# Patient Record
Sex: Female | Born: 1950 | Hispanic: No | Marital: Married | Smoking: Never smoker
Health system: Southern US, Community
[De-identification: ages and names within clinical notes are randomized; demographics above are authoritative.]

## PROBLEM LIST (undated history)

## (undated) DIAGNOSIS — I10 Essential (primary) hypertension: Secondary | ICD-10-CM

## (undated) DIAGNOSIS — R7303 Prediabetes: Secondary | ICD-10-CM

## (undated) DIAGNOSIS — E785 Hyperlipidemia, unspecified: Secondary | ICD-10-CM

## (undated) DIAGNOSIS — D649 Anemia, unspecified: Secondary | ICD-10-CM

## (undated) DIAGNOSIS — T7840XA Allergy, unspecified, initial encounter: Secondary | ICD-10-CM

## (undated) DIAGNOSIS — R310 Gross hematuria: Secondary | ICD-10-CM

## (undated) DIAGNOSIS — H269 Unspecified cataract: Secondary | ICD-10-CM

## (undated) DIAGNOSIS — M1990 Unspecified osteoarthritis, unspecified site: Secondary | ICD-10-CM

## (undated) DIAGNOSIS — N951 Menopausal and female climacteric states: Secondary | ICD-10-CM

## (undated) HISTORY — DX: Unspecified cataract: H26.9

## (undated) HISTORY — DX: Hyperlipidemia, unspecified: E78.5

## (undated) HISTORY — DX: Allergy, unspecified, initial encounter: T78.40XA

## (undated) HISTORY — DX: Unspecified osteoarthritis, unspecified site: M19.90

## (undated) HISTORY — DX: Menopausal and female climacteric states: N95.1

## (undated) HISTORY — DX: Essential (primary) hypertension: I10

## (undated) HISTORY — DX: Anemia, unspecified: D64.9

## (undated) HISTORY — PX: DILATION AND CURETTAGE OF UTERUS: SHX78

## (undated) HISTORY — PX: OTHER SURGICAL HISTORY: SHX169

## (undated) HISTORY — PX: COLONOSCOPY: SHX174

---

## 1999-12-28 ENCOUNTER — Other Ambulatory Visit: Admission: RE | Admit: 1999-12-28 | Discharge: 1999-12-28 | Payer: Self-pay | Admitting: Obstetrics and Gynecology

## 2000-01-18 ENCOUNTER — Ambulatory Visit (HOSPITAL_COMMUNITY): Admission: RE | Admit: 2000-01-18 | Discharge: 2000-01-18 | Payer: Self-pay | Admitting: Obstetrics and Gynecology

## 2000-02-12 ENCOUNTER — Encounter: Admission: RE | Admit: 2000-02-12 | Discharge: 2000-02-12 | Payer: Self-pay | Admitting: Obstetrics and Gynecology

## 2000-02-12 ENCOUNTER — Encounter: Payer: Self-pay | Admitting: Obstetrics and Gynecology

## 2001-09-28 ENCOUNTER — Encounter: Admission: RE | Admit: 2001-09-28 | Discharge: 2001-09-28 | Payer: Self-pay | Admitting: Family Medicine

## 2001-09-28 ENCOUNTER — Encounter: Payer: Self-pay | Admitting: Family Medicine

## 2002-10-01 ENCOUNTER — Encounter: Payer: Self-pay | Admitting: Internal Medicine

## 2002-10-01 ENCOUNTER — Encounter: Admission: RE | Admit: 2002-10-01 | Discharge: 2002-10-01 | Payer: Self-pay | Admitting: Internal Medicine

## 2003-11-02 ENCOUNTER — Encounter: Admission: RE | Admit: 2003-11-02 | Discharge: 2003-11-02 | Payer: Self-pay | Admitting: Family Medicine

## 2003-12-01 ENCOUNTER — Ambulatory Visit (HOSPITAL_COMMUNITY): Admission: RE | Admit: 2003-12-01 | Discharge: 2003-12-01 | Payer: Self-pay | Admitting: Cardiology

## 2013-02-15 ENCOUNTER — Ambulatory Visit: Payer: Self-pay | Admitting: Sports Medicine

## 2013-02-18 ENCOUNTER — Encounter: Payer: Self-pay | Admitting: Sports Medicine

## 2013-02-18 ENCOUNTER — Ambulatory Visit (INDEPENDENT_AMBULATORY_CARE_PROVIDER_SITE_OTHER): Payer: BC Managed Care – PPO | Admitting: Sports Medicine

## 2013-02-18 VITALS — BP 119/73 | HR 87 | Wt 165.0 lb

## 2013-02-18 DIAGNOSIS — Z299 Encounter for prophylactic measures, unspecified: Secondary | ICD-10-CM

## 2013-02-18 DIAGNOSIS — Z9109 Other allergy status, other than to drugs and biological substances: Secondary | ICD-10-CM | POA: Insufficient documentation

## 2013-02-18 DIAGNOSIS — D649 Anemia, unspecified: Secondary | ICD-10-CM

## 2013-02-18 DIAGNOSIS — Z0000 Encounter for general adult medical examination without abnormal findings: Secondary | ICD-10-CM | POA: Insufficient documentation

## 2013-02-18 DIAGNOSIS — K5900 Constipation, unspecified: Secondary | ICD-10-CM

## 2013-02-18 DIAGNOSIS — E669 Obesity, unspecified: Secondary | ICD-10-CM

## 2013-02-18 DIAGNOSIS — K5909 Other constipation: Secondary | ICD-10-CM | POA: Insufficient documentation

## 2013-02-18 DIAGNOSIS — I10 Essential (primary) hypertension: Secondary | ICD-10-CM

## 2013-02-18 DIAGNOSIS — E785 Hyperlipidemia, unspecified: Secondary | ICD-10-CM

## 2013-02-18 MED ORDER — LORATADINE 10 MG PO TABS: 10.0000 mg | ORAL_TABLET | Freq: Every day | ORAL | Status: DC

## 2013-02-18 MED ORDER — PHENTERMINE HCL 37.5 MG PO CAPS: 37.5000 mg | ORAL_CAPSULE | ORAL | Status: DC

## 2013-02-18 MED ORDER — SENNOSIDES-DOCUSATE SODIUM 8.6-50 MG PO TABS: 2.0000 | ORAL_TABLET | Freq: Two times a day (BID) | ORAL | Status: DC

## 2013-02-18 MED ORDER — OLOPATADINE HCL 0.1 % OP SOLN: 1.0000 [drp] | Freq: Two times a day (BID) | OPHTHALMIC | Status: DC

## 2013-02-18 NOTE — Assessment & Plan Note (Signed)
Loratadine daily, Patanol eyedrops.

## 2013-02-18 NOTE — Assessment & Plan Note (Signed)
Double fluid intake. Senokot-S 2 tabs twice a day until stooling normally then one tab daily. Can certainly consider Amitiza or other medications if above is ineffective.

## 2013-02-18 NOTE — Progress Notes (Signed)
  Subjective:    CC: Establish care.   HPI:  Allergies: Daily conjunctival irritation and watering eyes, worse during this time of year. Also has a dry, scratchy throat. She does note that this is worse in the mornings, she often gets a dry cough. Her previous doctor switched her from an ACE inhibitor to an ARB, and this seemed to help. She has not yet been treated for acid reflux.  Hypertension: Well controlled on current medication.  Chronic constipation: Admits that she doesn't drink much water, and has not been eating much fiber. Is wondering if there is a medicine that can help her stool more regularly.  Obesity: Exercises every day, tries to eat well but is unable to lose weight. Wondering if there is any other options. She is very motivated.  Preventative measures: Would like a referral to OB/GYN for cervical cancer screening, needs screening colonoscopy, mammogram, bone density test.  Past medical history, Surgical history, Family history not pertinant except as noted below, Social history, Allergies, and medications have been entered into the medical record, reviewed, and no changes needed.   Review of Systems: No headache, visual changes, nausea, vomiting, diarrhea, constipation, dizziness, abdominal pain, skin rash, fevers, chills, night sweats, swollen lymph nodes, weight loss, chest pain, body aches, joint swelling, muscle aches, shortness of breath, mood changes, visual or auditory hallucinations.  Objective:    General: Well Developed, well nourished, and in no acute distress.  Neuro: Alert and oriented x3, extra-ocular muscles intact, sensation grossly intact.  HEENT: Normocephalic, atraumatic, pupils equal round reactive to light, neck supple, no masses, no lymphadenopathy, thyroid nonpalpable. Eyes are watery, nasopharynx unremarkable, oropharynx unremarkable, external ear canals unremarkable. Skin: Warm and dry, no rashes noted.  Cardiac: Regular rate and rhythm, no  murmurs rubs or gallops.  Respiratory: Clear to auscultation bilaterally. Not using accessory muscles, speaking in full sentences.  Abdominal: Soft, nontender, nondistended, positive bowel sounds, no masses, no organomegaly.  Musculoskeletal: Shoulder, elbow, wrist, hip, knee, ankle stable, and with full range of motion. Impression and Recommendations:    The patient was counselled, risk factors were discussed, anticipatory guidance given.

## 2013-02-18 NOTE — Assessment & Plan Note (Signed)
Nutritionist visit. Phentermine. Return monthly for weight checks Continue exercise prescription.

## 2013-02-18 NOTE — Assessment & Plan Note (Signed)
Samples given for Benicar HCT. This is well controlled.

## 2013-02-18 NOTE — Assessment & Plan Note (Signed)
Checking routine blood work. Mammogram. She desires referral to OB/GYN for cervical cancer screening. Bone density test. GI referral for screening colonoscopy. Will discuss vaccinations at the next visit.

## 2013-02-23 ENCOUNTER — Ambulatory Visit (INDEPENDENT_AMBULATORY_CARE_PROVIDER_SITE_OTHER): Payer: BC Managed Care – PPO

## 2013-02-23 ENCOUNTER — Encounter: Payer: Self-pay | Admitting: Family Medicine

## 2013-02-23 ENCOUNTER — Ambulatory Visit (INDEPENDENT_AMBULATORY_CARE_PROVIDER_SITE_OTHER): Payer: BC Managed Care – PPO | Admitting: Family Medicine

## 2013-02-23 ENCOUNTER — Other Ambulatory Visit: Payer: Self-pay | Admitting: Sports Medicine

## 2013-02-23 VITALS — BP 107/70 | HR 67 | Temp 98.5°F | Resp 16 | Ht 60.0 in | Wt 163.0 lb

## 2013-02-23 DIAGNOSIS — Z01419 Encounter for gynecological examination (general) (routine) without abnormal findings: Secondary | ICD-10-CM

## 2013-02-23 DIAGNOSIS — Z13820 Encounter for screening for osteoporosis: Secondary | ICD-10-CM

## 2013-02-23 DIAGNOSIS — Z124 Encounter for screening for malignant neoplasm of cervix: Secondary | ICD-10-CM

## 2013-02-23 DIAGNOSIS — Z1231 Encounter for screening mammogram for malignant neoplasm of breast: Secondary | ICD-10-CM

## 2013-02-23 DIAGNOSIS — Z780 Asymptomatic menopausal state: Secondary | ICD-10-CM

## 2013-02-23 DIAGNOSIS — Z1151 Encounter for screening for human papillomavirus (HPV): Secondary | ICD-10-CM

## 2013-02-23 LAB — COMPREHENSIVE METABOLIC PANEL
ALT: 15 U/L (ref 0–35)
AST: 21 U/L (ref 0–37)
Alkaline Phosphatase: 78 U/L (ref 39–117)
BUN: 21 mg/dL (ref 6–23)
Chloride: 105 mEq/L (ref 96–112)
Creat: 0.93 mg/dL (ref 0.50–1.10)
Total Bilirubin: 0.7 mg/dL (ref 0.3–1.2)

## 2013-02-23 LAB — CBC
HCT: 34.4 % — ABNORMAL LOW (ref 36.0–46.0)
Hemoglobin: 11.4 g/dL — ABNORMAL LOW (ref 12.0–15.0)
MCH: 29 pg (ref 26.0–34.0)
MCHC: 33.1 g/dL (ref 30.0–36.0)
MCV: 87.5 fL (ref 78.0–100.0)
Platelets: 234 10*3/uL (ref 150–400)
RBC: 3.93 MIL/uL (ref 3.87–5.11)
RDW: 14.9 % (ref 11.5–15.5)
WBC: 5.4 10*3/uL (ref 4.0–10.5)

## 2013-02-23 LAB — COMPREHENSIVE METABOLIC PANEL WITH GFR
Albumin: 4 g/dL (ref 3.5–5.2)
CO2: 27 meq/L (ref 19–32)
Calcium: 9.2 mg/dL (ref 8.4–10.5)
Glucose, Bld: 88 mg/dL (ref 70–99)
Potassium: 4.2 meq/L (ref 3.5–5.3)
Sodium: 140 meq/L (ref 135–145)
Total Protein: 6.8 g/dL (ref 6.0–8.3)

## 2013-02-23 LAB — LIPID PANEL
Cholesterol: 210 mg/dL — ABNORMAL HIGH (ref 0–200)
HDL: 53 mg/dL (ref >39)
LDL Cholesterol: 139 mg/dL — ABNORMAL HIGH (ref 0–99)
Total CHOL/HDL Ratio: 4 Ratio
Triglycerides: 90 mg/dL (ref <150)
VLDL: 18 mg/dL (ref 0–40)

## 2013-02-23 LAB — TSH: TSH: 1.058 u[IU]/mL (ref 0.350–4.500)

## 2013-02-23 LAB — HEMOGLOBIN A1C
Hgb A1c MFr Bld: 5.9 % — ABNORMAL HIGH (ref <5.7)
Mean Plasma Glucose: 123 mg/dL — ABNORMAL HIGH (ref <117)

## 2013-02-23 NOTE — Progress Notes (Signed)
  Subjective:     Glenda Burke is a 62 y.o. female and is here for a comprehensive physical exam. The patient reports no problems.  History   Social History  . Marital Status: Married    Spouse Name: N/A    Number of Children: N/A  . Years of Education: N/A   Occupational History  . retired    Social History Main Topics  . Smoking status: Never Smoker   . Smokeless tobacco: Never Used  . Alcohol Use: No  . Drug Use: No  . Sexually Active: Yes -- Female partner(s)   Other Topics Concern  . Not on file   Social History Narrative  . No narrative on file   Health Maintenance  Topic Date Due  . Pap Smear  04/22/1970  . Tetanus/tdap  04/23/1971  . Mammogram  04/22/2002  . Colonoscopy  04/22/2002  . Zostavax  04/22/2012  . Influenza Vaccine  06/14/2013    The following portions of the patient's history were reviewed and updated as appropriate: allergies, current medications, past family history, past medical history, past social history, past surgical history and problem list.  Review of Systems A comprehensive review of systems was negative.   Objective:    BP 107/70  Pulse 67  Temp(Src) 98.5 F (36.9 C) (Oral)  Resp 16  Ht 5' (1.524 m)  Wt 163 lb (73.936 kg)  BMI 31.83 kg/m2 General appearance: alert, cooperative and appears stated age Head: Normocephalic, without obvious abnormality, atraumatic Neck: no adenopathy, supple, symmetrical, trachea midline and thyroid not enlarged, symmetric, no tenderness/mass/nodules Lungs: clear to auscultation bilaterally Breasts: normal appearance, no masses or tenderness Heart: regular rate and rhythm, S1, S2 normal, no murmur, click, rub or gallop Abdomen: soft, non-tender; bowel sounds normal; no masses,  no organomegaly Pelvic: cervix normal in appearance, external genitalia normal, no adnexal masses or tenderness, no cervical motion tenderness, uterus normal size, shape, and consistency and vagina atrophic Extremities:  extremities normal, atraumatic, no cyanosis or edema Pulses: 2+ and symmetric Skin: Skin color, texture, turgor normal. No rashes or lesions Lymph nodes: Cervical, supraclavicular, and axillary nodes normal. Neurologic: Grossly normal    Assessment:    Healthy female exam. Menopausal.       Plan:  Pap with HPV testing today.  If normal--next one in 5 years. Mammogram today, un-resulted. Blood work through PCP also un-resulted, but drawn this am.   See After Visit Summary for Counseling Recommendations

## 2013-02-23 NOTE — Patient Instructions (Addendum)
Preventive Care for Adults, Female A healthy lifestyle and preventive care can promote health and wellness. Preventive health guidelines for women include the following key practices.  A routine yearly physical is a good way to check with your caregiver about your health and preventive screening. It is a chance to share any concerns and updates on your health, and to receive a thorough exam.  Visit your dentist for a routine exam and preventive care every 6 months. Brush your teeth twice a day and floss once a day. Good oral hygiene prevents tooth decay and gum disease.  The frequency of eye exams is based on your age, health, family medical history, use of contact lenses, and other factors. Follow your caregiver's recommendations for frequency of eye exams.  Eat a healthy diet. Foods like vegetables, fruits, whole grains, low-fat dairy products, and lean protein foods contain the nutrients you need without too many calories. Decrease your intake of foods high in solid fats, added sugars, and salt. Eat the right amount of calories for you.Get information about a proper diet from your caregiver, if necessary.  Regular physical exercise is one of the most important things you can do for your health. Most adults should get at least 150 minutes of moderate-intensity exercise (any activity that increases your heart rate and causes you to sweat) each week. In addition, most adults need muscle-strengthening exercises on 2 or more days a week.  Maintain a healthy weight. The body mass index (BMI) is a screening tool to identify possible weight problems. It provides an estimate of body fat based on height and weight. Your caregiver can help determine your BMI, and can help you achieve or maintain a healthy weight.For adults 20 years and older:  A BMI below 18.5 is considered underweight.  A BMI of 18.5 to 24.9 is normal.  A BMI of 25 to 29.9 is considered overweight.  A BMI of 30 and above is  considered obese.  Maintain normal blood lipids and cholesterol levels by exercising and minimizing your intake of saturated fat. Eat a balanced diet with plenty of fruit and vegetables. Blood tests for lipids and cholesterol should begin at age 20 and be repeated every 5 years. If your lipid or cholesterol levels are high, you are over 50, or you are at high risk for heart disease, you may need your cholesterol levels checked more frequently.Ongoing high lipid and cholesterol levels should be treated with medicines if diet and exercise are not effective.  If you smoke, find out from your caregiver how to quit. If you do not use tobacco, do not start.  If you are pregnant, do not drink alcohol. If you are breastfeeding, be very cautious about drinking alcohol. If you are not pregnant and choose to drink alcohol, do not exceed 1 drink per day. One drink is considered to be 12 ounces (355 mL) of beer, 5 ounces (148 mL) of wine, or 1.5 ounces (44 mL) of liquor.  Avoid use of street drugs. Do not share needles with anyone. Ask for help if you need support or instructions about stopping the use of drugs.  High blood pressure causes heart disease and increases the risk of stroke. Your blood pressure should be checked at least every 1 to 2 years. Ongoing high blood pressure should be treated with medicines if weight loss and exercise are not effective.  If you are 55 to 61 years old, ask your caregiver if you should take aspirin to prevent strokes.  Diabetes   screening involves taking a blood sample to check your fasting blood sugar level. This should be done once every 3 years, after age 45, if you are within normal weight and without risk factors for diabetes. Testing should be considered at a younger age or be carried out more frequently if you are overweight and have at least 1 risk factor for diabetes.  Breast cancer screening is essential preventive care for women. You should practice "breast  self-awareness." This means understanding the normal appearance and feel of your breasts and may include breast self-examination. Any changes detected, no matter how small, should be reported to a caregiver. Women in their 20s and 30s should have a clinical breast exam (CBE) by a caregiver as part of a regular health exam every 1 to 3 years. After age 40, women should have a CBE every year. Starting at age 40, women should consider having a mammography (breast X-ray test) every year. Women who have a family history of breast cancer should talk to their caregiver about genetic screening. Women at a high risk of breast cancer should talk to their caregivers about having magnetic resonance imaging (MRI) and a mammography every year.  The Pap test is a screening test for cervical cancer. A Pap test can show cell changes on the cervix that might become cervical cancer if left untreated. A Pap test is a procedure in which cells are obtained and examined from the lower end of the uterus (cervix).  Women should have a Pap test starting at age 21.  Between ages 21 and 29, Pap tests should be repeated every 2 years.  Beginning at age 30, you should have a Pap test every 3 years as long as the past 3 Pap tests have been normal.  Some women have medical problems that increase the chance of getting cervical cancer. Talk to your caregiver about these problems. It is especially important to talk to your caregiver if a new problem develops soon after your last Pap test. In these cases, your caregiver may recommend more frequent screening and Pap tests.  The above recommendations are the same for women who have or have not gotten the vaccine for human papillomavirus (HPV).  If you had a hysterectomy for a problem that was not cancer or a condition that could lead to cancer, then you no longer need Pap tests. Even if you no longer need a Pap test, a regular exam is a good idea to make sure no other problems are  starting.  If you are between ages 65 and 70, and you have had normal Pap tests going back 10 years, you no longer need Pap tests. Even if you no longer need a Pap test, a regular exam is a good idea to make sure no other problems are starting.  If you have had past treatment for cervical cancer or a condition that could lead to cancer, you need Pap tests and screening for cancer for at least 20 years after your treatment.  If Pap tests have been discontinued, risk factors (such as a new sexual partner) need to be reassessed to determine if screening should be resumed.  The HPV test is an additional test that may be used for cervical cancer screening. The HPV test looks for the virus that can cause the cell changes on the cervix. The cells collected during the Pap test can be tested for HPV. The HPV test could be used to screen women aged 30 years and older, and should   be used in women of any age who have unclear Pap test results. After the age of 30, women should have HPV testing at the same frequency as a Pap test.  Colorectal cancer can be detected and often prevented. Most routine colorectal cancer screening begins at the age of 50 and continues through age 75. However, your caregiver may recommend screening at an earlier age if you have risk factors for colon cancer. On a yearly basis, your caregiver may provide home test kits to check for hidden blood in the stool. Use of a small camera at the end of a tube, to directly examine the colon (sigmoidoscopy or colonoscopy), can detect the earliest forms of colorectal cancer. Talk to your caregiver about this at age 50, when routine screening begins. Direct examination of the colon should be repeated every 5 to 10 years through age 75, unless early forms of pre-cancerous polyps or small growths are found.  Hepatitis C blood testing is recommended for all people born from 1945 through 1965 and any individual with known risks for hepatitis C.  Practice  safe sex. Use condoms and avoid high-risk sexual practices to reduce the spread of sexually transmitted infections (STIs). STIs include gonorrhea, chlamydia, syphilis, trichomonas, herpes, HPV, and human immunodeficiency virus (HIV). Herpes, HIV, and HPV are viral illnesses that have no cure. They can result in disability, cancer, and death. Sexually active women aged 25 and younger should be checked for chlamydia. Older women with new or multiple partners should also be tested for chlamydia. Testing for other STIs is recommended if you are sexually active and at increased risk.  Osteoporosis is a disease in which the bones lose minerals and strength with aging. This can result in serious bone fractures. The risk of osteoporosis can be identified using a bone density scan. Women ages 65 and over and women at risk for fractures or osteoporosis should discuss screening with their caregivers. Ask your caregiver whether you should take a calcium supplement or vitamin D to reduce the rate of osteoporosis.  Menopause can be associated with physical symptoms and risks. Hormone replacement therapy is available to decrease symptoms and risks. You should talk to your caregiver about whether hormone replacement therapy is right for you.  Use sunscreen with sun protection factor (SPF) of 30 or more. Apply sunscreen liberally and repeatedly throughout the day. You should seek shade when your shadow is shorter than you. Protect yourself by wearing long sleeves, pants, a wide-brimmed hat, and sunglasses year round, whenever you are outdoors.  Once a month, do a whole body skin exam, using a mirror to look at the skin on your back. Notify your caregiver of new moles, moles that have irregular borders, moles that are larger than a pencil eraser, or moles that have changed in shape or color.  Stay current with required immunizations.  Influenza. You need a dose every fall (or winter). The composition of the flu vaccine  changes each year, so being vaccinated once is not enough.  Pneumococcal polysaccharide. You need 1 to 2 doses if you smoke cigarettes or if you have certain chronic medical conditions. You need 1 dose at age 65 (or older) if you have never been vaccinated.  Tetanus, diphtheria, pertussis (Tdap, Td). Get 1 dose of Tdap vaccine if you are younger than age 65, are over 65 and have contact with an infant, are a healthcare worker, are pregnant, or simply want to be protected from whooping cough. After that, you need a Td   booster dose every 10 years. Consult your caregiver if you have not had at least 3 tetanus and diphtheria-containing shots sometime in your life or have a deep or dirty wound.  HPV. You need this vaccine if you are a woman age 26 or younger. The vaccine is given in 3 doses over 6 months.  Measles, mumps, rubella (MMR). You need at least 1 dose of MMR if you were born in 1957 or later. You may also need a second dose.  Meningococcal. If you are age 19 to 21 and a first-year college student living in a residence hall, or have one of several medical conditions, you need to get vaccinated against meningococcal disease. You may also need additional booster doses.  Zoster (shingles). If you are age 60 or older, you should get this vaccine.  Varicella (chickenpox). If you have never had chickenpox or you were vaccinated but received only 1 dose, talk to your caregiver to find out if you need this vaccine.  Hepatitis A. You need this vaccine if you have a specific risk factor for hepatitis A virus infection or you simply wish to be protected from this disease. The vaccine is usually given as 2 doses, 6 to 18 months apart.  Hepatitis B. You need this vaccine if you have a specific risk factor for hepatitis B virus infection or you simply wish to be protected from this disease. The vaccine is given in 3 doses, usually over 6 months. Preventive Services / Frequency Ages 19 to 39  Blood  pressure check.** / Every 1 to 2 years.  Lipid and cholesterol check.** / Every 5 years beginning at age 20.  Clinical breast exam.** / Every 3 years for women in their 20s and 30s.  Pap test.** / Every 2 years from ages 21 through 29. Every 3 years starting at age 30 through age 65 or 70 with a history of 3 consecutive normal Pap tests.  HPV screening.** / Every 3 years from ages 30 through ages 65 to 70 with a history of 3 consecutive normal Pap tests.  Hepatitis C blood test.** / For any individual with known risks for hepatitis C.  Skin self-exam. / Monthly.  Influenza immunization.** / Every year.  Pneumococcal polysaccharide immunization.** / 1 to 2 doses if you smoke cigarettes or if you have certain chronic medical conditions.  Tetanus, diphtheria, pertussis (Tdap, Td) immunization. / A one-time dose of Tdap vaccine. After that, you need a Td booster dose every 10 years.  HPV immunization. / 3 doses over 6 months, if you are 26 and younger.  Measles, mumps, rubella (MMR) immunization. / You need at least 1 dose of MMR if you were born in 1957 or later. You may also need a second dose.  Meningococcal immunization. / 1 dose if you are age 19 to 21 and a first-year college student living in a residence hall, or have one of several medical conditions, you need to get vaccinated against meningococcal disease. You may also need additional booster doses.  Varicella immunization.** / Consult your caregiver.  Hepatitis A immunization.** / Consult your caregiver. 2 doses, 6 to 18 months apart.  Hepatitis B immunization.** / Consult your caregiver. 3 doses usually over 6 months. Ages 40 to 64  Blood pressure check.** / Every 1 to 2 years.  Lipid and cholesterol check.** / Every 5 years beginning at age 20.  Clinical breast exam.** / Every year after age 40.  Mammogram.** / Every year beginning at age 40   and continuing for as long as you are in good health. Consult with your  caregiver.  Pap test.** / Every 3 years starting at age 30 through age 65 or 70 with a history of 3 consecutive normal Pap tests.  HPV screening.** / Every 3 years from ages 30 through ages 65 to 70 with a history of 3 consecutive normal Pap tests.  Fecal occult blood test (FOBT) of stool. / Every year beginning at age 50 and continuing until age 75. You may not need to do this test if you get a colonoscopy every 10 years.  Flexible sigmoidoscopy or colonoscopy.** / Every 5 years for a flexible sigmoidoscopy or every 10 years for a colonoscopy beginning at age 50 and continuing until age 75.  Hepatitis C blood test.** / For all people born from 1945 through 1965 and any individual with known risks for hepatitis C.  Skin self-exam. / Monthly.  Influenza immunization.** / Every year.  Pneumococcal polysaccharide immunization.** / 1 to 2 doses if you smoke cigarettes or if you have certain chronic medical conditions.  Tetanus, diphtheria, pertussis (Tdap, Td) immunization.** / A one-time dose of Tdap vaccine. After that, you need a Td booster dose every 10 years.  Measles, mumps, rubella (MMR) immunization. / You need at least 1 dose of MMR if you were born in 1957 or later. You may also need a second dose.  Varicella immunization.** / Consult your caregiver.  Meningococcal immunization.** / Consult your caregiver.  Hepatitis A immunization.** / Consult your caregiver. 2 doses, 6 to 18 months apart.  Hepatitis B immunization.** / Consult your caregiver. 3 doses, usually over 6 months. Ages 65 and over  Blood pressure check.** / Every 1 to 2 years.  Lipid and cholesterol check.** / Every 5 years beginning at age 20.  Clinical breast exam.** / Every year after age 40.  Mammogram.** / Every year beginning at age 40 and continuing for as long as you are in good health. Consult with your caregiver.  Pap test.** / Every 3 years starting at age 30 through age 65 or 70 with a 3  consecutive normal Pap tests. Testing can be stopped between 65 and 70 with 3 consecutive normal Pap tests and no abnormal Pap or HPV tests in the past 10 years.  HPV screening.** / Every 3 years from ages 30 through ages 65 or 70 with a history of 3 consecutive normal Pap tests. Testing can be stopped between 65 and 70 with 3 consecutive normal Pap tests and no abnormal Pap or HPV tests in the past 10 years.  Fecal occult blood test (FOBT) of stool. / Every year beginning at age 50 and continuing until age 75. You may not need to do this test if you get a colonoscopy every 10 years.  Flexible sigmoidoscopy or colonoscopy.** / Every 5 years for a flexible sigmoidoscopy or every 10 years for a colonoscopy beginning at age 50 and continuing until age 75.  Hepatitis C blood test.** / For all people born from 1945 through 1965 and any individual with known risks for hepatitis C.  Osteoporosis screening.** / A one-time screening for women ages 65 and over and women at risk for fractures or osteoporosis.  Skin self-exam. / Monthly.  Influenza immunization.** / Every year.  Pneumococcal polysaccharide immunization.** / 1 dose at age 65 (or older) if you have never been vaccinated.  Tetanus, diphtheria, pertussis (Tdap, Td) immunization. / A one-time dose of Tdap vaccine if you are over   65 and have contact with an infant, are a healthcare worker, or simply want to be protected from whooping cough. After that, you need a Td booster dose every 10 years.  Varicella immunization.** / Consult your caregiver.  Meningococcal immunization.** / Consult your caregiver.  Hepatitis A immunization.** / Consult your caregiver. 2 doses, 6 to 18 months apart.  Hepatitis B immunization.** / Check with your caregiver. 3 doses, usually over 6 months. ** Family history and personal history of risk and conditions may change your caregiver's recommendations. Document Released: 11/26/2001 Document Revised: 12/23/2011  Document Reviewed: 02/25/2011 [employer] Patient Information 2013 [employer].  

## 2013-02-24 DIAGNOSIS — E785 Hyperlipidemia, unspecified: Secondary | ICD-10-CM | POA: Insufficient documentation

## 2013-02-24 DIAGNOSIS — D649 Anemia, unspecified: Secondary | ICD-10-CM | POA: Insufficient documentation

## 2013-02-24 LAB — ANEMIA PANEL
%SAT: 29 % (ref 20–55)
ABS Retic: 24.2 K/uL (ref 19.0–186.0)
Ferritin: 48 ng/mL (ref 10–291)
Folate: >20 ng/mL
Iron: 85 ug/dL (ref 42–145)
RBC.: 4.03 MIL/uL (ref 3.87–5.11)
Retic Ct Pct: 0.6 % (ref 0.4–2.3)
TIBC: 294 ug/dL (ref 250–470)
UIBC: 209 ug/dL (ref 125–400)
Vitamin B-12: 529 pg/mL (ref 211–911)

## 2013-02-24 LAB — VITAMIN D 25 HYDROXY (VIT D DEFICIENCY, FRACTURES): Vit D, 25-Hydroxy: 35 ng/mL (ref 30–89)

## 2013-02-24 NOTE — Addendum Note (Signed)
Addended by: Monica Becton on: 02/24/2013 08:41 AM   Modules accepted: Orders

## 2013-02-25 ENCOUNTER — Telehealth: Payer: Self-pay | Admitting: *Deleted

## 2013-02-25 MED ORDER — ATORVASTATIN CALCIUM 40 MG PO TABS: 40.0000 mg | ORAL_TABLET | Freq: Every day | ORAL | Status: DC

## 2013-02-25 NOTE — Addendum Note (Signed)
Addended by: Monica Becton on: 02/25/2013 10:12 AM   Modules accepted: Orders

## 2013-02-25 NOTE — Assessment & Plan Note (Signed)
Lipitor 

## 2013-03-19 ENCOUNTER — Telehealth: Payer: Self-pay | Admitting: *Deleted

## 2013-03-19 MED ORDER — LOSARTAN POTASSIUM 50 MG PO TABS: 50.0000 mg | ORAL_TABLET | Freq: Every day | ORAL | Status: DC

## 2013-03-19 NOTE — Telephone Encounter (Signed)
Pt is asking if we can send Losartan to the [facility] for 30 days and send the Benicar to [facility] for 90 day supply. She states Losartan was written on her sheet but I don't know the dose. If we can also send the Benicar to [facility] pharmacy so that if it needs a prior auth I can get it started before she finishes the Losartan 30 day supply.   Meyer Cory, LPN

## 2013-03-19 NOTE — Telephone Encounter (Signed)
Lets forget the Benicar HCT for now, and just do losartan which should be just as effective. That way she doesn't have to pay so much money in the long run. I will send 30 to [facility] and 90 to mail order

## 2013-03-19 NOTE — Telephone Encounter (Signed)
Pt informed.  Glenda Fore, LPN  

## 2013-03-23 ENCOUNTER — Ambulatory Visit: Payer: BC Managed Care – PPO | Admitting: Sports Medicine

## 2013-04-23 ENCOUNTER — Ambulatory Visit (INDEPENDENT_AMBULATORY_CARE_PROVIDER_SITE_OTHER): Payer: BC Managed Care – PPO | Admitting: Sports Medicine

## 2013-04-23 VITALS — BP 152/97 | HR 69 | Wt 169.0 lb

## 2013-04-23 DIAGNOSIS — M545 Low back pain, unspecified: Secondary | ICD-10-CM

## 2013-04-23 DIAGNOSIS — Z299 Encounter for prophylactic measures, unspecified: Secondary | ICD-10-CM

## 2013-04-23 DIAGNOSIS — E669 Obesity, unspecified: Secondary | ICD-10-CM

## 2013-04-23 DIAGNOSIS — I10 Essential (primary) hypertension: Secondary | ICD-10-CM

## 2013-04-23 DIAGNOSIS — E785 Hyperlipidemia, unspecified: Secondary | ICD-10-CM

## 2013-04-23 DIAGNOSIS — Z9109 Other allergy status, other than to drugs and biological substances: Secondary | ICD-10-CM

## 2013-04-23 MED ORDER — LOSARTAN POTASSIUM-HCTZ 100-25 MG PO TABS: 1.0000 | ORAL_TABLET | Freq: Every day | ORAL | Status: DC

## 2013-04-23 NOTE — Progress Notes (Signed)
  Subjective:    CC: Followup  HPI: Hypertension: Worsened after switching from Benicar to losartan. No headaches, visual changes, chest pain.  Hyperlipidemia: Stop her Lipitor for no apparent reason.  Allergies: Resolved with Patanol and loratadine.  Obesity: Stopped her phentermine. Desires to restart once blood pressures are controlled.  Low back pain: Describes pain in her mid lower lumbar spine worse with sitting for long periods of time and with flexion. No radicular symptoms, moderate, persistent, stable.  Past medical history, Surgical history, Family history not pertinant except as noted below, Social history, Allergies, and medications have been entered into the medical record, reviewed, and no changes needed.   Review of Systems: No fevers, chills, night sweats, weight loss, chest pain, or shortness of breath.   Objective:    General: Well Developed, well nourished, and in no acute distress.  Neuro: Alert and oriented x3, extra-ocular muscles intact, sensation grossly intact.  HEENT: Normocephalic, atraumatic, pupils equal round reactive to light, neck supple, no masses, no lymphadenopathy, thyroid nonpalpable.  Skin: Warm and dry, no rashes. Cardiac: Regular rate and rhythm, no murmurs rubs or gallops, no lower extremity edema.  Respiratory: Clear to auscultation bilaterally. Not using accessory muscles, speaking in full sentences.  Impression and Recommendations:

## 2013-04-23 NOTE — Assessment & Plan Note (Signed)
Needs to restart Lipitor.

## 2013-04-23 NOTE — Assessment & Plan Note (Signed)
Herniated disc handout given. Return as needed for this.

## 2013-04-23 NOTE — Assessment & Plan Note (Signed)
Insufficient control with losartan. Increasing to losartan/hydrochlorothiazide. Return in 2 weeks

## 2013-04-23 NOTE — Assessment & Plan Note (Signed)
Still awaiting colonoscopy. 

## 2013-04-23 NOTE — Assessment & Plan Note (Signed)
Well controlled with loratadine and Patanol.

## 2013-04-23 NOTE — Assessment & Plan Note (Signed)
We will put this on hold until blood pressure is better controlled.

## 2013-04-26 ENCOUNTER — Ambulatory Visit: Payer: BC Managed Care – PPO | Admitting: Sports Medicine

## 2013-05-14 ENCOUNTER — Other Ambulatory Visit: Payer: Self-pay

## 2013-05-14 DIAGNOSIS — I10 Essential (primary) hypertension: Secondary | ICD-10-CM

## 2013-05-17 ENCOUNTER — Other Ambulatory Visit: Payer: Self-pay

## 2013-05-17 ENCOUNTER — Other Ambulatory Visit: Payer: Self-pay | Admitting: Sports Medicine

## 2013-05-17 DIAGNOSIS — I10 Essential (primary) hypertension: Secondary | ICD-10-CM

## 2013-05-17 MED ORDER — LOSARTAN POTASSIUM-HCTZ 100-25 MG PO TABS: 1.0000 | ORAL_TABLET | Freq: Every day | ORAL | Status: DC

## 2013-06-29 ENCOUNTER — Encounter: Payer: Self-pay | Admitting: Gastroenterology

## 2013-07-05 ENCOUNTER — Ambulatory Visit (AMBULATORY_SURGERY_CENTER): Payer: Self-pay | Admitting: *Deleted

## 2013-07-05 VITALS — Ht 62.0 in | Wt 165.0 lb

## 2013-07-05 DIAGNOSIS — Z1211 Encounter for screening for malignant neoplasm of colon: Secondary | ICD-10-CM

## 2013-07-05 MED ORDER — NA SULFATE-K SULFATE-MG SULF 17.5-3.13-1.6 GM/177ML PO SOLN: ORAL | Status: DC

## 2013-07-05 NOTE — Progress Notes (Signed)
Patient denies any allergies to eggs or soy. Patient denies any problems with anesthesia.  

## 2013-07-06 ENCOUNTER — Encounter: Payer: Self-pay | Admitting: Gastroenterology

## 2013-07-09 ENCOUNTER — Encounter: Payer: BC Managed Care – PPO | Attending: Sports Medicine | Admitting: *Deleted

## 2013-07-09 ENCOUNTER — Encounter: Payer: Self-pay | Admitting: *Deleted

## 2013-07-09 VITALS — Ht 62.0 in | Wt 164.0 lb

## 2013-07-09 DIAGNOSIS — E669 Obesity, unspecified: Secondary | ICD-10-CM

## 2013-07-09 DIAGNOSIS — Z713 Dietary counseling and surveillance: Secondary | ICD-10-CM | POA: Insufficient documentation

## 2013-07-09 NOTE — Progress Notes (Signed)
Medical Nutrition Therapy:  Appt start time: 1030 end time:  1130.  Assessment:  Primary concern today: Weight management. Patient reports a desire to lose weight. She lost about 30 pounds 1.5 years ago, but has regained about 10 pounds. She states that she follows a 1200 calorie diet and exercises frequently, but still struggles to maintain/lose weight. Based on 24 hour recall and daily activity, I suspect net calories are <1000 kcal per day, which may be countering weight loss efforts.   MEDICATIONS: Lipitor, Hyzaar, Senokot   DIETARY INTAKE:   Usual eating pattern includes 3 meals and 1-2 snacks per day.  24-hr recall:  B ( AM): Cereal (unsweetened), yogurt, coffee (cream only) OR egg, [place] bacon on weekends Snk ( AM): None  L ( PM): Yogurt OR 100 calorie popcorn, fruit Snk ( PM): None D ( PM): 2 vegetables, fish, rice/bread/tortilla, season Snk ( PM): Cranberries, peanuts Beverages: Water, coffee  Usual physical activity: Swimming, running/ellicptical 3-5 miles per day most days, yoga/pilates  Estimated energy needs: 1400 calories 175 g carbohydrates 88 g protein 39 g fat  Progress Towards Goal(s):  In progress.   Nutritional Diagnosis:  [place]-3.3 Overweight/obesity As related to history of excessive energy intake.  As evidenced by BMI 30.1.    Intervention:  Nutrition counseling. We discussed strategies for weight loss, including balancing nutrients (carbs, protein, fat), portion control, healthy snacks, and exercise.   Goals:  1. 0.5-1 pound weight loss per week. Goal weight: 150 pounds.  2. Increase protein intake (additional yogurt, egg whites, nuts, dried beans).  3. Continue to exercise 5 days weekly.   Handouts given during visit include:  Yellow meal plan card  Monitoring/Evaluation:  Dietary intake, exercise, and body weight prn.

## 2013-07-14 ENCOUNTER — Telehealth: Payer: Self-pay | Admitting: Gastroenterology

## 2013-07-14 NOTE — Telephone Encounter (Signed)
Pt states she cant afford the prep because it's too expensive. Pt to come to [facility] and pick up sample of suprep and to use as directed from Pre visit instructions. Pt verbalized understanding to come to 4th floor desk to pick up sample today before 4 pm. ewm

## 2013-07-16 ENCOUNTER — Encounter: Payer: Self-pay | Admitting: Gastroenterology

## 2013-07-16 ENCOUNTER — Ambulatory Visit (AMBULATORY_SURGERY_CENTER): Payer: BC Managed Care – PPO | Admitting: Gastroenterology

## 2013-07-16 VITALS — BP 113/62 | HR 64 | Temp 96.8°F | Resp 19 | Ht 62.0 in | Wt 164.0 lb

## 2013-07-16 DIAGNOSIS — Z1211 Encounter for screening for malignant neoplasm of colon: Secondary | ICD-10-CM

## 2013-07-16 DIAGNOSIS — D126 Benign neoplasm of colon, unspecified: Secondary | ICD-10-CM

## 2013-07-16 MED ORDER — SODIUM CHLORIDE 0.9 % IV SOLN: 500.0000 mL | INTRAVENOUS | Status: DC

## 2013-07-16 NOTE — Patient Instructions (Addendum)
YOU HAD AN ENDOSCOPIC PROCEDURE TODAY AT THE [facility]: Refer to the procedure report that was given to you for any specific questions about what was found during the examination.  If the procedure report does not answer your questions, please call your gastroenterologist to clarify.  If you requested that your care partner not be given the details of your procedure findings, then the procedure report has been included in a sealed envelope for you to review at your convenience later.  YOU SHOULD EXPECT: Some feelings of bloating in the abdomen. Passage of more gas than usual.  Walking can help get rid of the air that was put into your GI tract during the procedure and reduce the bloating. If you had a lower endoscopy (such as a colonoscopy or flexible sigmoidoscopy) you may notice spotting of blood in your stool or on the toilet paper. If you underwent a bowel prep for your procedure, then you may not have a normal bowel movement for a few days.  DIET: Your first meal following the procedure should be a light meal and then it is ok to progress to your normal diet.  A half-sandwich or bowl of soup is an example of a good first meal.  Heavy or fried foods are harder to digest and may make you feel nauseous or bloated.  Likewise meals heavy in dairy and vegetables can cause extra gas to form and this can also increase the bloating.  Drink plenty of fluids but you should avoid alcoholic beverages for 24 hours.  ACTIVITY: Your care partner should take you home directly after the procedure.  You should plan to take it easy, moving slowly for the rest of the day.  You can resume normal activity the day after the procedure however you should NOT DRIVE or use heavy machinery for 24 hours (because of the sedation medicines used during the test).    SYMPTOMS TO REPORT IMMEDIATELY: A gastroenterologist can be reached at any hour.  During normal business hours, 8:30 AM to 5:00 PM Monday through Friday,  call [phone].  After hours and on weekends, please call the GI answering service at [phone] emergency number who will take a message and have the physician on call contact you.   Following lower endoscopy (colonoscopy or flexible sigmoidoscopy):  Excessive amounts of blood in the stool  Significant tenderness or worsening of abdominal pains  Swelling of the abdomen that is new, acute  Fever of 100F or higher  FOLLOW UP: If any biopsies were taken you will be contacted by phone or by letter within the next 1-3 weeks.  Call your gastroenterologist if you have not heard about the biopsies in 3 weeks.  Our staff will call the home number listed on your records the next business day following your procedure to check on you and address any questions or concerns that you may have at that time regarding the information given to you following your procedure. This is a courtesy call and so if there is no answer at the home number and we have not heard from you through the emergency physician on call, we will assume that you have returned to your regular daily activities without incident.  SIGNATURES/CONFIDENTIALITY: You and/or your care partner have signed paperwork which will be entered into your electronic medical record.  These signatures attest to the fact that that the information above on your After Visit Summary has been reviewed and is understood.  Full responsibility of the confidentiality   of this discharge information lies with you and/or your care-partner.  Handout on polyps  

## 2013-07-16 NOTE — Progress Notes (Signed)
IV infiltrated from preop, restarted in room # 3 22 PIV restarted left arm for IV access. Warm compress applied to right ac old access. IV removed.

## 2013-07-16 NOTE — Op Note (Signed)
[facility] [address] [place], [address]   COLONOSCOPY PROCEDURE REPORT  PATIENT: Glenda Burke, Glenda Burke  MR#: [id number] BIRTHDATE: Oct 12, 1951 , 62  yrs. old GENDER: Female ENDOSCOPIST: Louis Meckel, MD REFERRED BY: PROCEDURE DATE:  07/16/2013 PROCEDURE:   Colonoscopy with snare polypectomy First Screening Colonoscopy - Avg.  risk and is 50 yrs.  old or older Yes.  Prior Negative Screening - Now for repeat screening. N/A  History of Adenoma - Now for follow-up colonoscopy & has been > or = to 3 yrs.  N/A  Polyps Removed Today? Yes. ASA CLASS:   Class II INDICATIONS:average risk screening. MEDICATIONS: MAC sedation, administered by CRNA and propofol (Diprivan) 200mg  IV  DESCRIPTION OF PROCEDURE:   After the risks benefits and alternatives of the procedure were thoroughly explained, informed consent was obtained.  A digital rectal exam revealed no abnormalities of the rectum.   The LB [id number]  endoscope was introduced through the anus and advanced to the cecum, which was identified by both the appendix and ileocecal valve. No adverse events experienced.   The quality of the prep was excellent using Suprep  The instrument was then slowly withdrawn as the colon was fully examined.      COLON FINDINGS: A sessile polyp measuring 3 mm in size was found in the distal transverse colon.  A polypectomy was performed with a cold snare.  The resection was complete and the polyp tissue was completely retrieved.   The colon mucosa was otherwise normal. Retroflexed views revealed no abnormalities. The time to cecum=3 minutes 40 seconds.  Withdrawal time=8 minutes 04 seconds.  The scope was withdrawn and the procedure completed. COMPLICATIONS: There were no complications.  ENDOSCOPIC IMPRESSION: 1.   Sessile polyp measuring 3 mm in size was found in the distal transverse colon; polypectomy was performed with a cold snare 2.   The colon mucosa was otherwise  normal  RECOMMENDATIONS: If the polyp(s) removed today are proven to be adenomatous (pre-cancerous) polyps, you will need a repeat colonoscopy in 5 years.  Otherwise you should continue to follow colorectal cancer screening guidelines for "routine risk" patients with colonoscopy in 10 years.  You will receive a letter within 1-2 weeks with the results of your biopsy as well as final recommendations.  Please call my office if you have not received a letter after 3 weeks.   eSigned:  Louis Meckel, MD 07/16/2013 9:03 AM   cc: Rodney Langton, MD   PATIENT NAME:  Hiliary, Osorto MR#: [id number]

## 2013-07-16 NOTE — Progress Notes (Signed)
Patient did not experience any of the following events: a burn prior to discharge; a fall within the facility; wrong site/side/patient/procedure/implant event; or a hospital transfer or hospital admission upon discharge from the facility. (G8907)Patient did not have preoperative order for IV antibiotic SSI prophylaxis. (G8918) ewm 

## 2013-07-16 NOTE — Progress Notes (Signed)
Pt to RR stable

## 2013-07-16 NOTE — Progress Notes (Signed)
Called to room to assist during endoscopic procedure.  Patient ID and intended procedure confirmed with present staff. Received instructions for my participation in the procedure from the performing physician.  

## 2013-07-19 ENCOUNTER — Telehealth: Payer: Self-pay | Admitting: *Deleted

## 2013-07-19 NOTE — Telephone Encounter (Signed)
  Follow up Call-  Call back number 07/16/2013  Post procedure Call Back phone  # [phone]  Permission to leave phone message Yes     Patient questions:  Do you have a fever, pain , or abdominal swelling? yes Pain Score  2 *  Have you tolerated food without any problems? yes  Have you been able to return to your normal activities? yes  Do you have any questions about your discharge instructions: Diet   no Medications  no Follow up visit  no  Do you have questions or concerns about your Care? yes  Actions: * If pain score is 4 or above: No action needed, pain <4.  Patient stating she had trouble with her IV during procedure. Patient reports that on insertion in Admitting she informed nurse that the IV was painful, nurse slowed the rate of the fluids. On administration of Propofol, Iv extremely painful, CRNA then changed the site. On arrival home patient removed gauze dressing and IV site swollen on original site. Patient stating still swollen yet able to move fingers and wrist freely. Instructed to apply warm compresses and call if any further problems arise.

## 2013-07-21 ENCOUNTER — Encounter: Payer: Self-pay | Admitting: Gastroenterology

## 2013-08-24 ENCOUNTER — Ambulatory Visit (INDEPENDENT_AMBULATORY_CARE_PROVIDER_SITE_OTHER): Payer: BC Managed Care – PPO | Admitting: Sports Medicine

## 2013-08-24 ENCOUNTER — Encounter: Payer: Self-pay | Admitting: Sports Medicine

## 2013-08-24 VITALS — BP 121/80 | HR 96 | Wt 166.0 lb

## 2013-08-24 DIAGNOSIS — Z299 Encounter for prophylactic measures, unspecified: Secondary | ICD-10-CM

## 2013-08-24 DIAGNOSIS — I10 Essential (primary) hypertension: Secondary | ICD-10-CM

## 2013-08-24 DIAGNOSIS — E785 Hyperlipidemia, unspecified: Secondary | ICD-10-CM

## 2013-08-24 DIAGNOSIS — L600 Ingrowing nail: Secondary | ICD-10-CM | POA: Insufficient documentation

## 2013-08-24 MED ORDER — ATORVASTATIN CALCIUM 40 MG PO TABS: 40.0000 mg | ORAL_TABLET | Freq: Every day | ORAL | Status: DC

## 2013-08-24 NOTE — Progress Notes (Signed)
  Subjective:    CC: Followup  HPI: Hypertension: Well controlled on current regimen.  Hyperlipidemia: Still has not started atorvastatin.  Allergies: Well controlled.  Toe pain: Localized left great toenail, thinks it is ingrown.  Preventive measure: Will be getting a flu shot today, up-to-date on colonoscopy, has a 5 year followup. Up-to-date on mammogram and cervical cancer screening.  Past medical history, Surgical history, Family history not pertinant except as noted below, Social history, Allergies, and medications have been entered into the medical record, reviewed, and no changes needed.   Review of Systems: No fevers, chills, night sweats, weight loss, chest pain, or shortness of breath.   Objective:    General: Well Developed, well nourished, and in no acute distress.  Neuro: Alert and oriented x3, extra-ocular muscles intact, sensation grossly intact.  HEENT: Normocephalic, atraumatic, pupils equal round reactive to light, neck supple, no masses, no lymphadenopathy, thyroid nonpalpable.  Skin: Warm and dry, no rashes. Cardiac: Regular rate and rhythm, no murmurs rubs or gallops, no lower extremity edema.  Respiratory: Clear to auscultation bilaterally. Not using accessory muscles, speaking in full sentences. Left foot: The left great medial nail plate does appear ingrown. There is no sign of paronychia.  Impression and Recommendations:

## 2013-08-24 NOTE — Assessment & Plan Note (Signed)
Patient will make followup visit for toenail removal. I will likely remove the medial nail plate of the left great toe.

## 2013-08-24 NOTE — Assessment & Plan Note (Signed)
Controlled, no changes. 

## 2013-08-24 NOTE — Assessment & Plan Note (Signed)
Has not yet started her atorvastatin, calling this in again. Recheck in 3 months.

## 2013-08-24 NOTE — Assessment & Plan Note (Signed)
Up-to-date on colonoscopy, 5 year followup.

## 2013-08-30 ENCOUNTER — Encounter: Payer: Self-pay | Admitting: Sports Medicine

## 2013-08-30 ENCOUNTER — Ambulatory Visit (INDEPENDENT_AMBULATORY_CARE_PROVIDER_SITE_OTHER): Payer: BC Managed Care – PPO | Admitting: Sports Medicine

## 2013-08-30 VITALS — BP 117/70 | HR 78 | Wt 167.0 lb

## 2013-08-30 DIAGNOSIS — L600 Ingrowing nail: Secondary | ICD-10-CM

## 2013-08-30 MED ORDER — HYDROCODONE-ACETAMINOPHEN 10-325 MG PO TABS: 1.0000 | ORAL_TABLET | Freq: Three times a day (TID) | ORAL | Status: DC | PRN

## 2013-08-30 NOTE — Progress Notes (Signed)
  Procedure:  Removal of left first medial toenail.  Risks, benefits, alternatives explained to patient. Consent obtained. Time out conducted. Noted no overlying induration or erythema at site of injection. Toe cleaned with alcohol, then a total of 8 cc lidocaine 2% infiltrated at adjacent webspaces at the location of the bifurcation of the common digital nerve to proper digital nerves.  Some lidocaine also infiltrated at hyponychium and under nail bed.  Adequate anesthesia ensured. Toe prepped and draped in a sterile fashion. Nail elevator used to separate nail plate from nail bed. Clippers used to cut toenail in a longitudinal fashion to proximal nail fold and matrix. Hemostat then used to separate nail fragment from surrounding structures. Minor bleeding controlled with pressure. Antibiotic ointment applied. Toe dressed. Advised to return if increased redness, swelling, drainage, fevers, or chills.

## 2013-08-30 NOTE — Assessment & Plan Note (Signed)
Left first medial nail plate excision as above. Hydrocodone for pain. Return in approximately 10 days for reevaluation and wound check.

## 2013-08-30 NOTE — Patient Instructions (Signed)
Toenail Removal   Toenails may need to be removed because of injury, infections, or to correct abnormal growth. A special non-stick bandage will likely be put tightly on your toe to prevent bleeding. Often times a new nail will grow back. Sometimes the new nail may be deformed. Most of the time when a nail is lost, it will gradually heal, but may be sensitive for a long time.   HOME CARE INSTRUCTIONS   Keep your foot elevated to relieve pain and swelling. This will require lying in bed or on a couch with the leg on pillows or sitting in a recliner with the leg up. Walking or letting your leg dangle may increase swelling, slow healing, and cause throbbing pain.   Keep your bandage dry and clean.   Change your bandage in 24 hours.   After your bandage is changed, soak your foot in warm, soapy water for 10 to 20 minutes. Do this 3 times per day. This helps reduce pain and swelling. After soaking your foot apply a clean, dry bandage. Change your bandage if it is wet or dirty.   Only take over-the-counter or prescription medicines for pain, discomfort, or fever as directed by your caregiver.   See your caregiver as needed for problems.  You might need a tetanus shot now if:   You have no idea when you had the last one.   You have never had a tetanus shot before.   The injured area had dirt in it.  If you need a tetanus shot, and you decide not to get one, there is a rare chance of getting tetanus. Sickness from tetanus can be serious. If you did get a tetanus shot, your arm may swell, get red and warm to the touch at the shot site. This is common and not a problem.   SEEK IMMEDIATE MEDICAL CARE IF:   You have increased pain, swelling, redness, warmth, drainage, or bleeding.   You have a fever.   You have swelling that spreads from your toe into your foot.  Document Released: 06/29/2003 Document Revised: 12/23/2011 Document Reviewed: 10/10/2008   Exit[employer]® Patient Information ©2014 [employer].

## 2013-08-31 ENCOUNTER — Ambulatory Visit (INDEPENDENT_AMBULATORY_CARE_PROVIDER_SITE_OTHER): Payer: BC Managed Care – PPO | Admitting: Sports Medicine

## 2013-08-31 VITALS — BP 137/73 | HR 64 | Wt 168.0 lb

## 2013-08-31 DIAGNOSIS — L600 Ingrowing nail: Secondary | ICD-10-CM

## 2013-08-31 DIAGNOSIS — T7840XA Allergy, unspecified, initial encounter: Secondary | ICD-10-CM

## 2013-08-31 MED ORDER — PREDNISONE 50 MG PO TABS: ORAL_TABLET | ORAL | Status: DC

## 2013-08-31 MED ORDER — NAPROXEN 500 MG PO TABS: 500.0000 mg | ORAL_TABLET | Freq: Two times a day (BID) | ORAL | Status: DC

## 2013-08-31 NOTE — Assessment & Plan Note (Signed)
Prednisone, Benadryl. Discontinue hydrocodone.

## 2013-08-31 NOTE — Assessment & Plan Note (Signed)
Switching to naproxen for pain, discontinuing hydrocodone, she had an allergic reaction. Continue previously planned followup.

## 2013-08-31 NOTE — Progress Notes (Signed)
  Subjective:    CC: Allergic reaction  HPI: This is a very pleasant 62 year old female, yesterday I removed her nail plate. Unfortunately she developed an allergic reaction to hydrocodone that was prescribed for pain. After her initial dose she developed swelling, and her face, itching all over her body, no respiratory or mucosal symptoms. Symptoms are moderate, improving significantly since she stopped her hydrocodone.  Toenail excision well, she only has minimal pain.  Past medical history, Surgical history, Family history not pertinant except as noted below, Social history, Allergies, and medications have been entered into the medical record, reviewed, and no changes needed.   Review of Systems: No fevers, chills, night sweats, weight loss, chest pain, or shortness of breath.   Objective:    General: Well Developed, well nourished, and in no acute distress.  Neuro: Alert and oriented x3, extra-ocular muscles intact, sensation grossly intact.  HEENT: Normocephalic, atraumatic, pupils equal round reactive to light, neck supple, no masses, no lymphadenopathy, thyroid nonpalpable. Face is swollen, eyelids are swollen, no oral or mucocutaneous involvement visible. Skin: Warm and dry, no rashes. Cardiac: Regular rate and rhythm, no murmurs rubs or gallops, no lower extremity edema.  Respiratory: Clear to auscultation bilaterally. Not using accessory muscles, speaking in full sentences.  Impression and Recommendations:

## 2013-09-08 ENCOUNTER — Ambulatory Visit (INDEPENDENT_AMBULATORY_CARE_PROVIDER_SITE_OTHER): Payer: BC Managed Care – PPO | Admitting: Sports Medicine

## 2013-09-08 ENCOUNTER — Encounter: Payer: Self-pay | Admitting: Sports Medicine

## 2013-09-08 VITALS — BP 139/89 | HR 97 | Wt 169.0 lb

## 2013-09-08 DIAGNOSIS — L600 Ingrowing nail: Secondary | ICD-10-CM

## 2013-09-08 DIAGNOSIS — Z5189 Encounter for other specified aftercare: Secondary | ICD-10-CM

## 2013-09-08 DIAGNOSIS — T7840XD Allergy, unspecified, subsequent encounter: Secondary | ICD-10-CM

## 2013-09-08 NOTE — Progress Notes (Signed)
  Subjective: 10 days status post excision of the medial nail plate, she did have an allergic reaction to hydrocodone, this is now cleared. She is pain-free.   Objective: General: Well-developed, well-nourished, and in no acute distress. Toenail looks good, no signs of infection, healing well.  Assessment/plan:

## 2013-09-08 NOTE — Assessment & Plan Note (Signed)
Resolved with discontinuation of hydrocodone.

## 2013-09-08 NOTE — Assessment & Plan Note (Signed)
Doing well, return as needed for this.

## 2013-10-01 ENCOUNTER — Ambulatory Visit (INDEPENDENT_AMBULATORY_CARE_PROVIDER_SITE_OTHER): Payer: BC Managed Care – PPO | Admitting: Sports Medicine

## 2013-10-01 VITALS — BP 137/89 | HR 72 | Wt 168.0 lb

## 2013-10-01 DIAGNOSIS — L600 Ingrowing nail: Secondary | ICD-10-CM

## 2013-10-01 DIAGNOSIS — E669 Obesity, unspecified: Secondary | ICD-10-CM

## 2013-10-01 DIAGNOSIS — E785 Hyperlipidemia, unspecified: Secondary | ICD-10-CM

## 2013-10-01 DIAGNOSIS — I10 Essential (primary) hypertension: Secondary | ICD-10-CM

## 2013-10-01 NOTE — Assessment & Plan Note (Signed)
Typically very well controlled, did have a cup of coffee daily.

## 2013-10-01 NOTE — Assessment & Plan Note (Signed)
Desires to wait until the new year before restarting phentermine.

## 2013-10-01 NOTE — Assessment & Plan Note (Signed)
Stable on Lipitor.

## 2013-10-01 NOTE — Assessment & Plan Note (Signed)
Healing extremely well. Discussed preventative measures.

## 2013-10-01 NOTE — Progress Notes (Signed)
  Subjective:    CC: Followup  HPI: Hypertension: Well controlled.  Hyperlipidemia: Stable on Lipitor.  Ingrown toenail: Doing extremely well after medial nail plate excision.  Obesity: Desires to wait until after the new year to restart phentermine. We were holding off on this due to her high blood pressure.  Past medical history, Surgical history, Family history not pertinant except as noted below, Social history, Allergies, and medications have been entered into the medical record, reviewed, and no changes needed.   Review of Systems: No fevers, chills, night sweats, weight loss, chest pain, or shortness of breath.   Objective:    General: Well Developed, well nourished, and in no acute distress.  Neuro: Alert and oriented x3, extra-ocular muscles intact, sensation grossly intact.  HEENT: Normocephalic, atraumatic, pupils equal round reactive to light, neck supple, no masses, no lymphadenopathy, thyroid nonpalpable.  Skin: Warm and dry, no rashes. Cardiac: Regular rate and rhythm, no murmurs rubs or gallops, no lower extremity edema.  Respiratory: Clear to auscultation bilaterally. Not using accessory muscles, speaking in full sentences. Left Great toe: Well-healed, no signs of paronychia or infection.  Impression and Recommendations:

## 2014-10-19 ENCOUNTER — Encounter: Payer: Self-pay | Admitting: Family Medicine

## 2014-10-19 ENCOUNTER — Ambulatory Visit (INDEPENDENT_AMBULATORY_CARE_PROVIDER_SITE_OTHER): Payer: 59 | Admitting: Family Medicine

## 2014-10-19 VITALS — BP 133/79 | HR 71 | Temp 97.8°F | Wt 180.0 lb

## 2014-10-19 DIAGNOSIS — A499 Bacterial infection, unspecified: Secondary | ICD-10-CM

## 2014-10-19 DIAGNOSIS — J029 Acute pharyngitis, unspecified: Secondary | ICD-10-CM

## 2014-10-19 DIAGNOSIS — J329 Chronic sinusitis, unspecified: Secondary | ICD-10-CM

## 2014-10-19 DIAGNOSIS — B9689 Other specified bacterial agents as the cause of diseases classified elsewhere: Secondary | ICD-10-CM

## 2014-10-19 MED ORDER — DOXYCYCLINE HYCLATE 100 MG PO TABS: ORAL_TABLET | ORAL | Status: AC

## 2014-10-19 MED ORDER — DOXYCYCLINE HYCLATE 100 MG PO TABS: ORAL_TABLET | ORAL | Status: DC

## 2014-10-19 NOTE — Progress Notes (Signed)
CC: Glenda Burke is a 64 y.o. female is here for Sore Throat   Subjective: HPI:  Sore throat, postnasal drip, cough that has been present for the past week on a daily basis moderate degree and severity with only mild improvement from over-the-counter cough medication from [place]. Symptoms are present all hours of the day. Nothing particularly makes it better or worse other than above. Denies shortness of breath, wheezing, blood in sputum, chest pain, confusion, rash. Review of systems positive for nasal congestion and fatigue.   Review Of Systems Outlined In HPI  Past Medical History  Diagnosis Date  . Allergy   . Hypertension   . Menopausal state     Past Surgical History  Procedure Laterality Date  . Arm surgery Left     plate and screw  . Dilation and curettage of uterus    . Polypectomy    . Colonoscopy  ?    out of state   Family History  Problem Relation Age of Onset  . COPD Mother   . Stroke Father   . Diabetes Father   . Hyperlipidemia Father   . Hypertension Father   . Hypertension Sister   . Hyperlipidemia Sister   . Diabetes Sister   . Kidney disease Brother   . Hypertension Brother   . Hyperlipidemia Brother   . Diabetes Brother   . Colon cancer Neg Hx   . Stomach cancer Neg Hx     History   Social History  . Marital Status: Married    Spouse Name: N/A    Number of Children: N/A  . Years of Education: N/A   Occupational History  . retired    Social History Main Topics  . Smoking status: Never Smoker   . Smokeless tobacco: Never Used  . Alcohol Use: Yes     Comment: wine occasional  . Drug Use: No  . Sexual Activity:    Partners: Male   Other Topics Concern  . Not on file   Social History Narrative     Objective: BP 133/79 mmHg  Pulse 71  Temp(Src) 97.8 F (36.6 C) (Oral)  Wt 180 lb (81.647 kg)  General: Alert and Oriented, No Acute Distress HEENT: Pupils equal, round, reactive to light. Conjunctivae clear.  External ears  unremarkable, canals clear with intact TMs with appropriate landmarks.  Middle ear appears open without effusion. Pink inferior turbinates.  Moist mucous membranes, pharynx without inflammation nor lesions however moderate postnasal drip.  Neck supple with shotty anterior chain bilaterally lymphadenopathy Lungs: Clear to auscultation bilaterally, no wheezing/ronchi/rales.  Comfortable work of breathing. Good air movement. Extremities: No peripheral edema.  Strong peripheral pulses.  Mental Status: No depression, anxiety, nor agitation. Skin: Warm and dry.  Assessment & Plan: Glenda Burke was seen today for sore throat.  Diagnoses and associated orders for this visit:  Acute pharyngitis, unspecified pharyngitis type - POCT rapid strep A  Bacterial sinusitis - Discontinue: doxycycline (VIBRA-TABS) 100 MG tablet; One by mouth twice a day for ten days. - doxycycline (VIBRA-TABS) 100 MG tablet; One by mouth twice a day for ten days.    Acutebacterial sinusitis: Start doxycycline consider nasal saline washes. Rapid strep test today was negative.  Return if symptoms worsen or fail to improve.

## 2014-10-24 ENCOUNTER — Ambulatory Visit (INDEPENDENT_AMBULATORY_CARE_PROVIDER_SITE_OTHER): Payer: 59 | Admitting: Sports Medicine

## 2014-10-24 ENCOUNTER — Ambulatory Visit (INDEPENDENT_AMBULATORY_CARE_PROVIDER_SITE_OTHER): Payer: 59

## 2014-10-24 ENCOUNTER — Encounter: Payer: Self-pay | Admitting: Sports Medicine

## 2014-10-24 VITALS — BP 116/74 | HR 63 | Temp 99.2°F | Wt 179.0 lb

## 2014-10-24 DIAGNOSIS — J209 Acute bronchitis, unspecified: Secondary | ICD-10-CM

## 2014-10-24 DIAGNOSIS — R05 Cough: Secondary | ICD-10-CM

## 2014-10-24 MED ORDER — HYDROCOD POLST-CHLORPHEN POLST 10-8 MG/5ML PO LQCR
5.0000 mL | Freq: Two times a day (BID) | ORAL | Status: DC | PRN
Start: 2014-10-24 — End: 2014-10-24

## 2014-10-24 MED ORDER — PREDNISONE 50 MG PO TABS: 50.0000 mg | ORAL_TABLET | Freq: Every day | ORAL | Status: DC

## 2014-10-24 MED ORDER — LEVOFLOXACIN 750 MG PO TABS: 750.0000 mg | ORAL_TABLET | Freq: Every day | ORAL | Status: DC

## 2014-10-24 MED ORDER — FLUTICASONE PROPIONATE 50 MCG/ACT NA SUSP: NASAL | Status: DC

## 2014-10-24 MED ORDER — ACETAMINOPHEN-CODEINE 120-12 MG/5ML PO SUSP: 5.0000 mL | Freq: Four times a day (QID) | ORAL | Status: DC | PRN

## 2014-10-24 NOTE — Progress Notes (Signed)
  Subjective:    CC:  Still sick  HPI: This is a pleasant 64 year old female, for the past month she's had cough, sinus pressure, runny nose. She did have a course of azithromycin, and then a cephalosporin by other providers. Unfortunately she is continued to have a cough, nasal discharge, postnasal drip. Only minimal sinus pressure, symptoms are moderate, persistent, no chest pain, shortness of breath, no leg swelling, no GI symptoms, no rash.  Past medical history, Surgical history, Family history not pertinant except as noted below, Social history, Allergies, and medications have been entered into the medical record, reviewed, and no changes needed.   Review of Systems: No fevers, chills, night sweats, weight loss, chest pain, or shortness of breath.   Objective:    General: Well Developed, well nourished, and in no acute distress. Coughing in the exam room. Neuro: Alert and oriented x3, extra-ocular muscles intact, sensation grossly intact.  HEENT: Normocephalic, atraumatic, pupils equal round reactive to light, neck supple, no masses, no lymphadenopathy, thyroid nonpalpable. Oropharynx, nasopharynx, ear canals are unremarkable with the exception of mild pharyngeal erythema suggestive of a postnasal drip syndrome. Skin: Warm and dry, no rashes. Cardiac: Regular rate and rhythm, no murmurs rubs or gallops, no lower extremity edema.  Respiratory: Clear to auscultation bilaterally. Not using accessory muscles, speaking in full sentences.  Chest x-ray is negative.  Impression and Recommendations:

## 2014-10-24 NOTE — Assessment & Plan Note (Signed)
This has been present for one month now She is already failed a course of azithromycin. Levofloxacin, Flonase, prednisone, Tylenol with codeine syrup, chest x-ray.  Return if no better in 2 weeks.

## 2014-12-07 ENCOUNTER — Ambulatory Visit (INDEPENDENT_AMBULATORY_CARE_PROVIDER_SITE_OTHER): Payer: 59 | Admitting: Sports Medicine

## 2014-12-07 ENCOUNTER — Encounter: Payer: Self-pay | Admitting: Sports Medicine

## 2014-12-07 VITALS — BP 113/75 | HR 78 | Wt 178.0 lb

## 2014-12-07 DIAGNOSIS — M25511 Pain in right shoulder: Secondary | ICD-10-CM

## 2014-12-07 DIAGNOSIS — E669 Obesity, unspecified: Secondary | ICD-10-CM

## 2014-12-07 HISTORY — DX: Pain in right shoulder: M25.511

## 2014-12-07 MED ORDER — PHENTERMINE HCL 37.5 MG PO TABS: ORAL_TABLET | ORAL | Status: DC

## 2014-12-07 MED ORDER — MELOXICAM 15 MG PO TABS: ORAL_TABLET | ORAL | Status: DC

## 2014-12-07 NOTE — Assessment & Plan Note (Signed)
There does appear to be significant dysfunction of the subscapularis and supraspinatus. Meloxicam, formal physical therapy. X-rays. Return in a month.

## 2014-12-07 NOTE — Assessment & Plan Note (Signed)
Restarting phentermine, return monthly for weight checks and refills. 

## 2014-12-07 NOTE — Progress Notes (Signed)
  Subjective:    CC: Right shoulder pain  HPI: For the past month and a half Glenda Burke has had worsening pain on the lateral aspect of her right shoulder, worse with overhead activities, moderate, persistent without radiation. She denies any neck pain, or radiation into her hands her fingertips.  Obesity: She does also desire to restart weight loss medicine.  Past medical history, Surgical history, Family history not pertinant except as noted below, Social history, Allergies, and medications have been entered into the medical record, reviewed, and no changes needed.   Review of Systems: No fevers, chills, night sweats, weight loss, chest pain, or shortness of breath.   Objective:    General: Well Developed, well nourished, and in no acute distress.  Neuro: Alert and oriented x3, extra-ocular muscles intact, sensation grossly intact.  HEENT: Normocephalic, atraumatic, pupils equal round reactive to light, neck supple, no masses, no lymphadenopathy, thyroid nonpalpable.  Skin: Warm and dry, no rashes. Cardiac: Regular rate and rhythm, no murmurs rubs or gallops, no lower extremity edema.  Respiratory: Clear to auscultation bilaterally. Not using accessory muscles, speaking in full sentences. Right Shoulder: Inspection reveals no abnormalities, atrophy or asymmetry. Palpation is normal with no tenderness over AC joint or bicipital groove. ROM is full in all planes. Rotator cuff strength normal throughout. Positive Neer and Hawkin's tests, empty can. Speeds and Yergason's tests normal. No labral pathology noted with negative Obrien's, negative crank, negative clunk, and good stability. Normal scapular function observed. No painful arc and no drop arm sign. No apprehension sign  Impression and Recommendations:

## 2014-12-12 ENCOUNTER — Telehealth: Payer: Self-pay | Admitting: Sports Medicine

## 2014-12-12 NOTE — Telephone Encounter (Signed)
Pt called. She wants refill on Losartan/HCTZ-100-25mg .

## 2014-12-12 NOTE — Telephone Encounter (Signed)
Called patient to advised that apt is needed to follow up for hyperlipidemia and to have lab rechecked because patient has not been seen for this in a year. Glenda Burke,CMA

## 2014-12-19 ENCOUNTER — Ambulatory Visit: Payer: 59 | Admitting: Sports Medicine

## 2014-12-19 ENCOUNTER — Ambulatory Visit: Payer: 59 | Admitting: Physical Therapy

## 2015-01-03 ENCOUNTER — Other Ambulatory Visit: Payer: Self-pay | Admitting: Sports Medicine

## 2015-01-23 ENCOUNTER — Ambulatory Visit: Payer: BC Managed Care – PPO | Admitting: Family Medicine

## 2015-03-27 ENCOUNTER — Other Ambulatory Visit: Payer: Self-pay | Admitting: Sports Medicine

## 2015-06-02 ENCOUNTER — Ambulatory Visit (INDEPENDENT_AMBULATORY_CARE_PROVIDER_SITE_OTHER): Payer: 59

## 2015-06-02 ENCOUNTER — Encounter: Payer: Self-pay | Admitting: Sports Medicine

## 2015-06-02 ENCOUNTER — Ambulatory Visit (INDEPENDENT_AMBULATORY_CARE_PROVIDER_SITE_OTHER): Payer: 59 | Admitting: Sports Medicine

## 2015-06-02 VITALS — BP 131/88 | HR 104 | Ht 62.0 in | Wt 170.0 lb

## 2015-06-02 DIAGNOSIS — M25562 Pain in left knee: Secondary | ICD-10-CM | POA: Diagnosis not present

## 2015-06-02 DIAGNOSIS — H5203 Hypermetropia, bilateral: Secondary | ICD-10-CM

## 2015-06-02 DIAGNOSIS — M1711 Unilateral primary osteoarthritis, right knee: Secondary | ICD-10-CM

## 2015-06-02 DIAGNOSIS — E669 Obesity, unspecified: Secondary | ICD-10-CM | POA: Diagnosis not present

## 2015-06-02 DIAGNOSIS — Z0101 Encounter for examination of eyes and vision with abnormal findings: Secondary | ICD-10-CM

## 2015-06-02 DIAGNOSIS — M25561 Pain in right knee: Secondary | ICD-10-CM

## 2015-06-02 DIAGNOSIS — H5200 Hypermetropia, unspecified eye: Secondary | ICD-10-CM | POA: Insufficient documentation

## 2015-06-02 DIAGNOSIS — I10 Essential (primary) hypertension: Secondary | ICD-10-CM

## 2015-06-02 DIAGNOSIS — E785 Hyperlipidemia, unspecified: Secondary | ICD-10-CM

## 2015-06-02 MED ORDER — PHENTERMINE HCL 37.5 MG PO TABS: ORAL_TABLET | ORAL | Status: DC

## 2015-06-02 MED ORDER — MELOXICAM 15 MG PO TABS: ORAL_TABLET | ORAL | Status: DC

## 2015-06-02 MED ORDER — LOSARTAN POTASSIUM-HCTZ 100-25 MG PO TABS: 1.0000 | ORAL_TABLET | Freq: Every day | ORAL | Status: DC

## 2015-06-02 NOTE — Assessment & Plan Note (Signed)
Restarting phentermine, continue weight checks and refills monthly.

## 2015-06-02 NOTE — Progress Notes (Signed)
  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  Hypertension: Needs a refill on blood pressure medicine  Right knee pain: Medial joint line, gelling in the morning, moderate, persistent without radiation or mechanical symptoms.  Past medical history, Surgical history, Family history not pertinant except as noted below, Social history, Allergies, and medications have been entered into the medical record, reviewed, and no changes needed.   Review of Systems: No fevers, chills, night sweats, weight loss, chest pain, or shortness of breath.   Objective:    General: Well Developed, well nourished, and in no acute distress.  Neuro: Alert and oriented x3, extra-ocular muscles intact, sensation grossly intact.  HEENT: Normocephalic, atraumatic, pupils equal round reactive to light, neck supple, no masses, no lymphadenopathy, thyroid nonpalpable.  Skin: Warm and dry, no rashes. Cardiac: Regular rate and rhythm, no murmurs rubs or gallops, no lower extremity edema.  Respiratory: Clear to auscultation bilaterally. Not using accessory muscles, speaking in full sentences. Right Knee: Minimal effusion with a palpable fluid wave and tenderness at the medial joint line ROM normal in flexion and extension and lower leg rotation. Ligaments with solid consistent endpoints including ACL, PCL, LCL, MCL. Negative Mcmurray's and provocative meniscal tests. Non painful patellar compression. Patellar and quadriceps tendons unremarkable. Hamstring and quadriceps strength is normal.  Impression and Recommendations:

## 2015-06-02 NOTE — Assessment & Plan Note (Signed)
Physical exam coming up, adding routine blood work beforehand

## 2015-06-02 NOTE — Assessment & Plan Note (Signed)
X-rays, meloxicam, reaction knee brace. Return in one month.

## 2015-06-02 NOTE — Assessment & Plan Note (Signed)
Refilling blood pressure medication. 

## 2015-06-02 NOTE — Assessment & Plan Note (Signed)
With a headache and eye pain when straining on objects close up, she does have reading glasses but does not wear them, I have asked her to discuss this with her ophthalmologist and possibly get eyeglasses that control both her farsightedness and nearsightedness/bifocals.

## 2015-08-24 LAB — CBC
HCT: 34.4 % — ABNORMAL LOW (ref 36.0–46.0)
Hemoglobin: 11.4 g/dL — ABNORMAL LOW (ref 12.0–15.0)
MCH: 28.5 pg (ref 26.0–34.0)
MCHC: 33.1 g/dL (ref 30.0–36.0)
MCV: 86 fL (ref 78.0–100.0)
MPV: 10.4 fL (ref 8.6–12.4)
Platelets: 253 K/uL (ref 150–400)
RBC: 4 MIL/uL (ref 3.87–5.11)
RDW: 14.9 % (ref 11.5–15.5)
WBC: 6.3 10*3/uL (ref 4.0–10.5)

## 2015-08-24 LAB — HEMOGLOBIN A1C
Hgb A1c MFr Bld: 6.3 % — ABNORMAL HIGH (ref <5.7)
Mean Plasma Glucose: 134 mg/dL — ABNORMAL HIGH (ref <117)

## 2015-08-25 LAB — LIPID PANEL
Cholesterol: 216 mg/dL — ABNORMAL HIGH (ref 125–200)
HDL: 67 mg/dL (ref >=46)
LDL Cholesterol: 134 mg/dL — ABNORMAL HIGH (ref <130)
Total CHOL/HDL Ratio: 3.2 Ratio (ref <=5.0)
Triglycerides: 74 mg/dL (ref <150)
VLDL: 15 mg/dL (ref <30)

## 2015-08-25 LAB — COMPREHENSIVE METABOLIC PANEL
ALT: 25 U/L (ref 6–29)
AST: 19 U/L (ref 10–35)
Alkaline Phosphatase: 118 U/L (ref 33–130)
BUN: 15 mg/dL (ref 7–25)
Creat: 0.83 mg/dL (ref 0.50–0.99)
Potassium: 4 mmol/L (ref 3.5–5.3)
Sodium: 140 mmol/L (ref 135–146)
Total Bilirubin: 0.6 mg/dL (ref 0.2–1.2)
Total Protein: 6.6 g/dL (ref 6.1–8.1)

## 2015-08-25 LAB — TSH: TSH: 1.638 u[IU]/mL (ref 0.350–4.500)

## 2015-08-25 LAB — COMPREHENSIVE METABOLIC PANEL WITH GFR
Albumin: 3.8 g/dL (ref 3.6–5.1)
CO2: 27 mmol/L (ref 20–31)
Calcium: 9 mg/dL (ref 8.6–10.4)
Chloride: 103 mmol/L (ref 98–110)
Glucose, Bld: 95 mg/dL (ref 65–99)

## 2015-08-29 ENCOUNTER — Encounter: Payer: Self-pay | Admitting: Sports Medicine

## 2015-08-29 ENCOUNTER — Ambulatory Visit (INDEPENDENT_AMBULATORY_CARE_PROVIDER_SITE_OTHER): Payer: 59 | Admitting: Sports Medicine

## 2015-08-29 VITALS — BP 110/75 | HR 111 | Temp 98.1°F | Resp 18 | Wt 167.8 lb

## 2015-08-29 DIAGNOSIS — N63 Unspecified lump in breast: Secondary | ICD-10-CM | POA: Diagnosis not present

## 2015-08-29 DIAGNOSIS — E669 Obesity, unspecified: Secondary | ICD-10-CM | POA: Diagnosis not present

## 2015-08-29 DIAGNOSIS — Z23 Encounter for immunization: Secondary | ICD-10-CM | POA: Diagnosis not present

## 2015-08-29 DIAGNOSIS — Z0000 Encounter for general adult medical examination without abnormal findings: Secondary | ICD-10-CM | POA: Diagnosis not present

## 2015-08-29 DIAGNOSIS — Z1239 Encounter for other screening for malignant neoplasm of breast: Secondary | ICD-10-CM | POA: Diagnosis not present

## 2015-08-29 DIAGNOSIS — N6310 Unspecified lump in the right breast, unspecified quadrant: Secondary | ICD-10-CM

## 2015-08-29 MED ORDER — PHENTERMINE HCL 37.5 MG PO TABS: ORAL_TABLET | ORAL | Status: DC

## 2015-08-29 NOTE — Progress Notes (Signed)
  Subjective:    CC: complete physical exam  HPI:  Physical exam: Due for a mammogram, physical exam as below. Pap smear was 1 year ago.  Hyperlipidemia: Not taking any medication.  Prediabetes: Agreeable to work on weight loss  Hypertension: Stable  Obesity: Desires to start weight loss treatment.  Past medical history, Surgical history, Family history not pertinant except as noted below, Social history, Allergies, and medications have been entered into the medical record, reviewed, and no changes needed.   Review of Systems: No headache, visual changes, nausea, vomiting, diarrhea, constipation, dizziness, abdominal pain, skin rash, fevers, chills, night sweats, swollen lymph nodes, weight loss, chest pain, body aches, joint swelling, muscle aches, shortness of breath, mood changes, visual or auditory hallucinations.  Objective:    General: Well Developed, well nourished, and in no acute distress.  Neuro: Alert and oriented x3, extra-ocular muscles intact, sensation grossly intact. Cranial nerves II through XII are intact, motor, sensory, and coordinative functions are all intact. HEENT: Normocephalic, atraumatic, pupils equal round reactive to light, neck supple, no masses, no lymphadenopathy, thyroid nonpalpable. Oropharynx, nasopharynx, external ear canals are unremarkable. Skin: Warm and dry, no rashes noted.  Cardiac: Regular rate and rhythm, no murmurs rubs or gallops.  Respiratory: Clear to auscultation bilaterally. Not using accessory muscles, speaking in full sentences.  Abdominal: Soft, nontender, nondistended, positive bowel sounds, no masses, no organomegaly.  Musculoskeletal: Shoulder, elbow, wrist, hip, knee, ankle stable, and with full range of motion.  Impression and Recommendations:    The patient was counselled, risk factors were discussed, anticipatory guidance given.

## 2015-08-29 NOTE — Assessment & Plan Note (Signed)
Complete physical as above, we did discuss her blood work and we are going to work aggressively on weight loss. Adding a mammogram which she can do tomorrow.

## 2015-08-29 NOTE — Assessment & Plan Note (Signed)
Starting phentermine, nutrition referral, monthly returns for weight checks and refills.

## 2015-08-30 ENCOUNTER — Ambulatory Visit (INDEPENDENT_AMBULATORY_CARE_PROVIDER_SITE_OTHER): Payer: 59

## 2015-08-30 DIAGNOSIS — R928 Other abnormal and inconclusive findings on diagnostic imaging of breast: Secondary | ICD-10-CM

## 2015-09-01 DIAGNOSIS — N6310 Unspecified lump in the right breast, unspecified quadrant: Secondary | ICD-10-CM | POA: Insufficient documentation

## 2015-09-01 NOTE — Addendum Note (Signed)
Addended by: Silverio Decamp on: 09/01/2015 03:47 PM   Modules accepted: Orders

## 2015-09-06 ENCOUNTER — Other Ambulatory Visit (HOSPITAL_COMMUNITY): Payer: Self-pay | Admitting: *Deleted

## 2015-09-06 DIAGNOSIS — R928 Other abnormal and inconclusive findings on diagnostic imaging of breast: Secondary | ICD-10-CM

## 2015-09-15 ENCOUNTER — Encounter (HOSPITAL_COMMUNITY): Payer: Self-pay

## 2015-09-15 ENCOUNTER — Ambulatory Visit
Admission: RE | Admit: 2015-09-15 | Discharge: 2015-09-15 | Disposition: A | Discharge status: Home / Self Care | Payer: No Typology Code available for payment source | Source: Ambulatory Visit | Attending: Sports Medicine | Admitting: Sports Medicine

## 2015-09-15 ENCOUNTER — Ambulatory Visit (HOSPITAL_COMMUNITY)
Admission: RE | Admit: 2015-09-15 | Discharge: 2015-09-15 | Disposition: A | Discharge status: Home / Self Care | Payer: 59 | Source: Ambulatory Visit | Attending: Obstetrics and Gynecology | Admitting: Obstetrics and Gynecology

## 2015-09-15 ENCOUNTER — Ambulatory Visit
Admission: RE | Admit: 2015-09-15 | Discharge: 2015-09-15 | Disposition: A | Discharge status: Home / Self Care | Payer: No Typology Code available for payment source | Source: Ambulatory Visit | Attending: Obstetrics and Gynecology | Admitting: Obstetrics and Gynecology

## 2015-09-15 ENCOUNTER — Other Ambulatory Visit (HOSPITAL_COMMUNITY): Payer: Self-pay | Admitting: Obstetrics and Gynecology

## 2015-09-15 VITALS — BP 130/82 | Temp 97.8°F | Ht 62.0 in | Wt 171.0 lb

## 2015-09-15 DIAGNOSIS — N6310 Unspecified lump in the right breast, unspecified quadrant: Secondary | ICD-10-CM

## 2015-09-15 DIAGNOSIS — R928 Other abnormal and inconclusive findings on diagnostic imaging of breast: Secondary | ICD-10-CM

## 2015-09-15 DIAGNOSIS — Z1239 Encounter for other screening for malignant neoplasm of breast: Secondary | ICD-10-CM

## 2015-09-15 NOTE — Progress Notes (Signed)
Patient referred to [facility] by the [facility] due to recommending additional imaging of the right breast. Screening mammogram completed 08/30/2015 at the [facility].  Pap Smear:  Pap smear not completed today. Last Pap smear was 02/23/2013 at the [facility] [place] and normal with negative HPV. Per patient has no history of an abnormal Pap smear. Last Pap smear result is in EPIC.  Physical exam: Breasts Breasts symmetrical. No skin abnormalities bilateral breasts. No nipple retraction bilateral breasts. No nipple discharge bilateral breasts. No lymphadenopathy. No lumps palpated bilateral breasts. No complaints of pain or tenderness on exam. Referred patient to the [facility] for right breast diagnostic mammogram and possible ultrasound per recommendation. Appointment scheduled for Friday, September 15, 2015 at 1550.     Pelvic/Bimanual No Pap smear completed today since last Pap smear was 02/23/2013. Pap smear not indicated per BCCCP guidelines.

## 2015-09-15 NOTE — Patient Instructions (Signed)
Educational materials on self breast awareness given. Explained to Glenda Burke that she did not need a Pap smear today due to last Pap smear was 02/23/2013. Let her know BCCCP will cover Pap smears and HPV typing every 5 years unless has a history of abnormal Pap smears. Referred patient to the [facility] for right breast diagnostic mammogram and possible ultrasound per recommendation. Appointment scheduled for Friday, September 15, 2015 at 1550. Patient aware of appointment and will be there. Staphany Burke verbalized understanding.  Teauna Dubach, Arvil Chaco, RN 3:21 PM

## 2015-10-04 NOTE — Addendum Note (Signed)
Encounter addended by: Loletta Parish, RN on: 10/04/2015 12:20 PM<BR>     Documentation filed: Demographics Visit

## 2015-12-08 ENCOUNTER — Encounter: Payer: Self-pay | Admitting: Sports Medicine

## 2015-12-08 ENCOUNTER — Ambulatory Visit (INDEPENDENT_AMBULATORY_CARE_PROVIDER_SITE_OTHER): Payer: BLUE CROSS/BLUE SHIELD | Admitting: Sports Medicine

## 2015-12-08 VITALS — BP 112/76 | HR 86 | Resp 18 | Wt 169.9 lb

## 2015-12-08 DIAGNOSIS — M545 Low back pain, unspecified: Secondary | ICD-10-CM

## 2015-12-08 DIAGNOSIS — E669 Obesity, unspecified: Secondary | ICD-10-CM

## 2015-12-08 DIAGNOSIS — K5900 Constipation, unspecified: Secondary | ICD-10-CM | POA: Diagnosis not present

## 2015-12-08 DIAGNOSIS — K5909 Other constipation: Secondary | ICD-10-CM

## 2015-12-08 MED ORDER — LINACLOTIDE 145 MCG PO CAPS: 145.0000 ug | ORAL_CAPSULE | Freq: Every day | ORAL | Status: DC

## 2015-12-08 MED ORDER — PHENTERMINE HCL 37.5 MG PO TABS: ORAL_TABLET | ORAL | Status: DC

## 2015-12-08 MED ORDER — MELOXICAM 15 MG PO TABS: ORAL_TABLET | ORAL | Status: DC

## 2015-12-08 NOTE — Assessment & Plan Note (Signed)
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 X-rays of the hip, lumbar spine, refilling meloxicam, adding formal physical therapy. Return in one month, MRI for interventional injection planning if no better.

## 2015-12-08 NOTE — Progress Notes (Signed)
  Subjective:    CC: back pain  HPI: Obesity: No weight loss however has been out of phentermine for sometime now. She did go see the nutritionist who told her that she was eating appropriately.  Low back pain: Present for sometime now, on and off, worse with sitting, Valsalva. Nothing radicular. Moderate, persistent, no bowel or bladder dysfunction or saddle numbness, no constitutional symptoms.  Past medical history, Surgical history, Family history not pertinant except as noted below, Social history, Allergies, and medications have been entered into the medical record, reviewed, and no changes needed.   Review of Systems: No fevers, chills, night sweats, weight loss, chest pain, or shortness of breath.   Objective:    General: Well Developed, well nourished, and in no acute distress.  Neuro: Alert and oriented x3, extra-ocular muscles intact, sensation grossly intact.  HEENT: Normocephalic, atraumatic, pupils equal round reactive to light, neck supple, no masses, no lymphadenopathy, thyroid nonpalpable.  Skin: Warm and dry, no rashes. Cardiac: Regular rate and rhythm, no murmurs rubs or gallops, no lower extremity edema.  Respiratory: Clear to auscultation bilaterally. Not using accessory muscles, speaking in full sentences. Back Exam:  Inspection: Unremarkable  Motion: Flexion 45 deg, Extension 45 deg, Side Bending to 45 deg bilaterally,  Rotation to 45 deg bilaterally  SLR laying: Negative  XSLR laying: Negative  Palpable tenderness: None. FABER: negative. Sensory change: Gross sensation intact to all lumbar and sacral dermatomes.  Reflexes: 2+ at both patellar tendons, 2+ at achilles tendons, Babinski's downgoing.  Strength at foot  Plantar-flexion: 5/5 Dorsi-flexion: 5/5 Eversion: 5/5 Inversion: 5/5  Leg strength  Quad: 5/5 Hamstring: 5/5 Hip flexor: 5/5 Hip abductors: 5/5  Gait unremarkable. Bilateral hips: ROM IR: 60 Deg, ER: 60 Deg, Flexion: 120 Deg, Extension: 100  Deg, Abduction: 45 Deg, Adduction: 45 Deg, internal rotation range of motion is good however that does reproduce some of her lower back pain Strength IR: 5/5, ER: 5/5, Flexion: 5/5, Extension: 5/5, Abduction: 5/5, Adduction: 5/5 Pelvic alignment unremarkable to inspection and palpation. Standing hip rotation and gait without trendelenburg / unsteadiness. Greater trochanter without tenderness to palpation. No tenderness over piriformis. No SI joint tenderness and normal minimal SI movement.  Impression and Recommendations:    I spent 25 minutes with this patient, greater than 50% was face-to-face time counseling regarding the above diagnoses

## 2015-12-08 NOTE — Assessment & Plan Note (Signed)
Restarting phentermine, return monthly for weight checks and refills, we are entering the second month, no weight loss after the last month

## 2015-12-08 NOTE — Assessment & Plan Note (Signed)
Has failed over-the-counter agents, adding Linzess

## 2015-12-12 ENCOUNTER — Ambulatory Visit (INDEPENDENT_AMBULATORY_CARE_PROVIDER_SITE_OTHER): Payer: BLUE CROSS/BLUE SHIELD

## 2015-12-12 ENCOUNTER — Ambulatory Visit (INDEPENDENT_AMBULATORY_CARE_PROVIDER_SITE_OTHER): Payer: BLUE CROSS/BLUE SHIELD | Admitting: Rehabilitative and Restorative Service Providers"

## 2015-12-12 ENCOUNTER — Encounter: Payer: Self-pay | Admitting: Rehabilitative and Restorative Service Providers"

## 2015-12-12 DIAGNOSIS — M623 Immobility syndrome (paraplegic): Secondary | ICD-10-CM | POA: Diagnosis not present

## 2015-12-12 DIAGNOSIS — M2560 Stiffness of unspecified joint, not elsewhere classified: Secondary | ICD-10-CM

## 2015-12-12 DIAGNOSIS — M25552 Pain in left hip: Secondary | ICD-10-CM | POA: Diagnosis not present

## 2015-12-12 DIAGNOSIS — M4316 Spondylolisthesis, lumbar region: Secondary | ICD-10-CM

## 2015-12-12 DIAGNOSIS — M25551 Pain in right hip: Secondary | ICD-10-CM

## 2015-12-12 DIAGNOSIS — M545 Low back pain, unspecified: Secondary | ICD-10-CM

## 2015-12-12 DIAGNOSIS — Z7409 Other reduced mobility: Secondary | ICD-10-CM

## 2015-12-12 DIAGNOSIS — R29898 Other symptoms and signs involving the musculoskeletal system: Secondary | ICD-10-CM

## 2015-12-12 DIAGNOSIS — R531 Weakness: Secondary | ICD-10-CM

## 2015-12-12 DIAGNOSIS — R6889 Other general symptoms and signs: Secondary | ICD-10-CM

## 2015-12-12 NOTE — Patient Instructions (Addendum)
Trunk: Prone Extension (Press-Ups)    Lie on stomach on firm, flat surface. Relax bottom and legs. Raise chest in air with elbows straight. Keep hips flat on surface, sag stomach. Hold __2-3__ seconds. Repeat _10___ times. Do __2__ sessions per day. CAUTION: Movement should be gentle and slow.  Prone prop on elbows 1-3 min  2 tines/day  Gluteal Sets    Tighten buttocks while pressing pelvis to floor. Hold __10__ seconds. Repeat _10___ times per set.  Do _1-2_ sessions per day.  3 part core     With neutral spine, tighten pelvic floor and abdominals sucking belly button to back bone; tighten muscles in back at waist. Hold 10 sec Repeat _10__ times. Do _several __ times a day. Progress to do this exercise in sitting; standing; walking and with functional activities    Sleeping on Back  Place pillow under knees. A pillow with cervical support and a roll around waist are also helpful. Copyright  VHI. All rights reserved.  Sleeping on Side Place pillow between knees. Use cervical support under neck and a roll around waist as needed. Copyright  VHI. All rights reserved.   Sleeping on Stomach   If this is the only desirable sleeping position, place pillow under lower legs, and under stomach or chest as needed.  Posture - Sitting   Sit upright, head facing forward. Try using a roll to support lower back. Keep shoulders relaxed, and avoid rounded back. Keep hips level with knees. Avoid crossing legs for long periods. Stand to Sit / Sit to Stand   To sit: Bend knees to lower self onto front edge of chair, then scoot back on seat. To stand: Reverse sequence by placing one foot forward, and scoot to front of seat. Use rocking motion to stand up.   Work Height and Reach  Ideal work height is no more than 2 to 4 inches below elbow level when standing, and at elbow level when sitting. Reaching should be limited to arm's length, with elbows slightly bent.  Bending  Bend at  hips and knees, not back. Keep feet shoulder-width apart.    Posture - Standing   Good posture is important. Avoid slouching and forward head thrust. Maintain curve in low back and align ears over shoul- ders, hips over ankles.  Alternating Positions   Alternate tasks and change positions frequently to reduce fatigue and muscle tension. Take rest breaks. Computer Work   Position work to Programmer, multimedia. Use proper work and seat height. Keep shoulders back and down, wrists straight, and elbows at right angles. Use chair that provides full back support. Add footrest and lumbar roll as needed.  Getting Into / Out of Car  Lower self onto seat, scoot back, then bring in one leg at a time. Reverse sequence to get out.  Dressing  Lie on back to pull socks or slacks over feet, or sit and bend leg while keeping back straight.    Housework - Sink  Place one foot on ledge of cabinet under sink when standing at sink for prolonged periods.   Pushing / Pulling  Pushing is preferable to pulling. Keep back in proper alignment, and use leg muscles to do the work.  Deep Squat   Squat and lift with both arms held against upper trunk. Tighten stomach muscles without holding breath. Use smooth movements to avoid jerking.  Avoid Twisting   Avoid twisting or bending back. Pivot around using foot movements, and bend at knees if needed when  reaching for articles.  Carrying Luggage   Distribute weight evenly on both sides. Use a cart whenever possible. Do not twist trunk. Move body as a unit.   Lifting Principles .Maintain proper posture and head alignment. .Slide object as close as possible before lifting. .Move obstacles out of the way. .Test before lifting; ask for help if too heavy. .Tighten stomach muscles without holding breath. .Use smooth movements; do not jerk. .Use legs to do the work, and pivot with feet. .Distribute the work load symmetrically and close to the center of  trunk. .Push instead of pull whenever possible.   Ask For Help   Ask for help and delegate to others when possible. Coordinate your movements when lifting together, and maintain the low back curve.  Log Roll   Lying on back, bend left knee and place left arm across chest. Roll all in one movement to the right. Reverse to roll to the left. Always move as one unit. Housework - Sweeping  Use long-handled equipment to avoid stooping.   Housework - Wiping  Position yourself as close as possible to reach work surface. Avoid straining your back.  Laundry - Unloading Wash   To unload small items at bottom of washer, lift leg opposite to arm being used to reach.  [place] close to area to be raked. Use arm movements to do the work. Keep back straight and avoid twisting.     Cart  When reaching into cart with one arm, lift opposite leg to keep back straight.   Getting Into / Out of Bed  Lower self to lie down on one side by raising legs and lowering head at the same time. Use arms to assist moving without twisting. Bend both knees to roll onto back if desired. To sit up, start from lying on side, and use same move-ments in reverse. Housework - Vacuuming  Hold the vacuum with arm held at side. Step back and forth to move it, keeping head up. Avoid twisting.   Laundry - IT consultant so that bending and twisting can be avoided.   Laundry - Unloading Dryer  Squat down to reach into clothes dryer or use a reacher.  Gardening - Weeding / Probation officer or Kneel. Knee pads may be helpful.

## 2015-12-12 NOTE — Therapy (Signed)
[place], [place], [address] Phone: [phone]   Fax:  [phone]  Physical Therapy Evaluation  Patient Details  Name: Glenda Burke MRN: [id number] Date of Birth: May 22, 1951 Referring Provider: Dr Dianah Field  Encounter Date: 12/12/2015      PT End of Session - 12/12/15 1234    Visit Number 1   Number of Visits 12   Date for PT Re-Evaluation 01/23/16   PT Start Time [id number]   PT Stop Time 1238   PT Time Calculation (min) 53 min   Activity Tolerance Patient tolerated treatment well      Past Medical History  Diagnosis Date  . Allergy   . Hypertension   . Menopausal state     Past Surgical History  Procedure Laterality Date  . Arm surgery Left     plate and screw  . Dilation and curettage of uterus    . Polypectomy    . Colonoscopy  ?    [place] of [place]    There were no vitals filed for this visit.  Visit Diagnosis:  Bilateral low back pain without sciatica - Plan: PT plan of care cert/re-cert  Stiffness due to immobility - Plan: PT plan of care cert/re-cert  Weakness of both lower extremities - Plan: PT plan of care cert/re-cert  Decreased strength, endurance, and mobility - Plan: PT plan of care cert/re-cert      Subjective Assessment - 12/12/15 1139    Subjective Patiewnt reports that she has been having LBP for the past 2 months. She awoke with pain which resolved with medicatioin. She noticed pain about 10 days ago with morning stiffness and pain which continued to increase. ADL's are problematic.    Pertinent History bilat knee pain intermittently   How long can you sit comfortably? 30-45 min    How long can you stand comfortably? 30 min    How long can you walk comfortably? 5-10 min    Diagnostic tests xrays no results    Patient Stated Goals get rid of the back pain; return to exercise - yoga/zumba/eliptical   Currently in Pain? Yes   Pain Score 3    Pain Location  Buttocks   Pain Orientation Left   Pain Descriptors / Indicators Tightness   Pain Radiating Towards back into the Rt buttock   Pain Onset 1 to 4 weeks ago   Pain Frequency Intermittent   Aggravating Factors  prolonged sitting; standing; walking; lying on either side   Pain Relieving Factors meds; hot shower; soak in tab            [facility] PT Assessment - 12/12/15 0001    Assessment   Medical Diagnosis LBP    Referring Provider Dr Dianah Field   Onset Date/Surgical Date 12/02/15   Hand Dominance Left   Next MD Visit 4/17   Precautions   Precautions None   Balance Screen   Has the patient fallen in the past 6 months No   Has the patient had a decrease in activity level because of a fear of falling?  No   Is the patient reluctant to leave their home because of a fear of falling?  No   Home Environment   Additional Comments multilevel home some difficulty with steps due to LBP    Prior Function   Level of Independence Independent   Vocation Retired   Environmental consultant at [employer]; exercises - water aerobics; gym 3-5 times/wk classes and cardio; knitting;  reading; cooking/household chores; vacations    Observation/Other Assessments   Focus on Therapeutic Outcomes (FOTO)  57% limitation    Sensation   Additional Comments WFL' sper pt report    Posture/Postural Control   Posture Comments head forward shoulders rounded and elevated; increased thoracic kyphosis; slight fwd flexion flexed at hips    AROM   Overall AROM Comments hip obility WFL's except end range tightness in extension and rotation bilat    Lumbar Flexion 100%  pulling and pain in the mid to Lt LB    Lumbar Extension 80%  pulling contralateral trunk    Lumbar - Right Side Bend 75%  pulling contralateral trunk    Lumbar - Left Side Bend 75%   Lumbar - Right Rotation 40%   Lumbar - Left Rotation 35%  pulling thoracic area    Strength   Overall Strength Comments 5/5 except hip extension 4+/5    Flexibility    Hamstrings tight ~90 deg   Quadriceps WFL's    ITB mild tight bilat   Piriformis mild tightness Lt > Lt    Palpation   Spinal mobility pain with PA glides L4/5 and UPA L4/5 bilat    SI assessment  tenderness to palpation/spring testing bilat    Palpation comment tight bilat hips through glut medius; piriformis                    [facility] Adult PT Treatment/Exercise - 12/12/15 0001    Lumbar Exercises: Stretches   Prone on Elbows Stretch 1 rep;60 seconds   Press Ups --  10 reps 2-3 sec hold    Lumbar Exercises: Supine   Ab Set --  3 part core 10 sec x 10 reps    Moist Heat Therapy   Number Minutes Moist Heat 18 Minutes   Moist Heat Location Lumbar Spine   Electrical Stimulation   Electrical Stimulation Location bilat L4/5; bilat hips    Electrical Stimulation Action IFC   Electrical Stimulation Parameters to tolerance    Electrical Stimulation Goals Pain                PT Education - 12/12/15 1211    Education provided Yes   Education Details HEP    Person(s) Educated Patient   Methods Explanation;Demonstration;Tactile cues;Verbal cues;Handout   Comprehension Verbalized understanding;Returned demonstration;Verbal cues required;Tactile cues required             PT Long Term Goals - 12/12/15 1238    PT LONG TERM GOAL #1   Title Improve core strength and stability in order for pt to perform functional activities with less pain 01/23/16   Time 6   Period Weeks   Status New   PT LONG TERM GOAL #2   Title Improve strength and stability allowing pt ro perform exercise program and ADL's without difficulty 01/23/16   Time 6   Period Weeks   Status New   PT LONG TERM GOAL #3   Title Decrease pain to 0/10 to 2/10 for 80-90% of day to allow pt to perform ADL's and volunteer activities without pain 01/23/16   Time 6   Period Weeks   Status New   PT LONG TERM GOAL #4   Title Improve strength bilat hip extension to 5/5 to allow pt to perform ADL's with greater  ease 01/23/16   Time 6   Period Weeks   Status New   PT LONG TERM GOAL #5   Title I in HEP and  return to gym exercise program to improve fitness and strength 01/23/16   Time 6   Period Weeks   Status New   PT LONG TERM GOAL #6   Title Improve FOTO to </= 34% limitatioin 01/23/16   Time 6   Period Weeks   Status New               Plan - 12/12/15 1235    Clinical Impression Statement Glenda Burke presents with lumbar dysfunction including pain; limited mobility; poor core stability; decreased functional activity level. She will benefit form PT to address problems identified.    Pt will benefit from skilled therapeutic intervention in order to improve on the following deficits Postural dysfunction;Improper body mechanics;Increased fascial restricitons;Decreased range of motion;Decreased mobility;Decreased strength;Decreased endurance;Decreased activity tolerance;Pain   Rehab Potential Good   PT Frequency 2x / week   PT Duration 6 weeks   PT Treatment/Interventions Patient/family education;ADLs/Self Care Home Management;Therapeutic exercise;Therapeutic activities;Neuromuscular re-education;Manual techniques;Dry needling;Cryotherapy;Electrical Stimulation;Moist Heat;Ultrasound   PT Next Visit Plan progress with lumbar stabilization and core strengthening; spine care education; manual work; modalities as indicated    PT Home Exercise Plan HEP; spine care; info about TENS unit   Consulted and Agree with Plan of Care Patient         Problem List Patient Active Problem List   Diagnosis Date Noted  . Mass of breast, right 09/01/2015  . Primary osteoarthritis of right knee 06/02/2015  . Farsightedness 06/02/2015  . Axial Low back pain 04/23/2013  . Hyperlipidemia 02/24/2013  . Anemia 02/24/2013  . Essential hypertension, benign 02/18/2013  . Obesity 02/18/2013  . Environmental allergies 02/18/2013  . Annual physical exam 02/18/2013  . Chronic constipation 02/18/2013    Glenda Burke Nilda Simmer PT, MPH  12/12/2015, 1:01 PM  [facility] [place], [place], [address] Phone: [phone]   Fax:  [phone]  Name: Imberly Waage MRN: [id number] Date of Birth: 1950-11-01

## 2015-12-14 ENCOUNTER — Encounter: Payer: BLUE CROSS/BLUE SHIELD | Admitting: Rehabilitative and Restorative Service Providers"

## 2015-12-19 ENCOUNTER — Encounter: Payer: Self-pay | Admitting: Rehabilitative and Restorative Service Providers"

## 2015-12-19 ENCOUNTER — Ambulatory Visit (INDEPENDENT_AMBULATORY_CARE_PROVIDER_SITE_OTHER): Payer: BLUE CROSS/BLUE SHIELD | Admitting: Rehabilitative and Restorative Service Providers"

## 2015-12-19 DIAGNOSIS — Z7409 Other reduced mobility: Secondary | ICD-10-CM

## 2015-12-19 DIAGNOSIS — M545 Low back pain, unspecified: Secondary | ICD-10-CM

## 2015-12-19 DIAGNOSIS — R29898 Other symptoms and signs involving the musculoskeletal system: Secondary | ICD-10-CM

## 2015-12-19 DIAGNOSIS — R531 Weakness: Secondary | ICD-10-CM

## 2015-12-19 DIAGNOSIS — M2560 Stiffness of unspecified joint, not elsewhere classified: Secondary | ICD-10-CM

## 2015-12-19 DIAGNOSIS — M623 Immobility syndrome (paraplegic): Secondary | ICD-10-CM | POA: Diagnosis not present

## 2015-12-19 NOTE — Patient Instructions (Signed)
Self massage using about a 4 inch rubber ball. Lying on back or standing at wall.   HIP: Hamstrings - Supine    Place strap around foot. Raise leg up, keep knee straight. Hold _30__ seconds. _3__ reps per set, __2_ sets per day  Piriformis Stretch    Lying on back, pull right knee toward opposite shoulder. Hold __30__ seconds. Repeat __3__ times. Do _1-2___ sessions per day.   Gluteal Sets    Tighten buttocks while pressing pelvis to floor. Hold __10__ seconds. Repeat __10__ times per set. Do _1-2___ sets per session. Do __1-2__ sessions per day.  Quad Set    With other leg bent, foot flat, slowly tighten muscles on thigh of straight leg while counting out loud to __10__. Repeat with other leg. Repeat __10__ times. Do __2__ sessions per day.   HIP: Flexion / KNEE: Extension, Straight Leg Raise    Raise leg, keeping knee straight. Hold 5 sec Perform slowly. __10_ reps per set, _1-2__ sets per day, _1-2__ days per week

## 2015-12-19 NOTE — Therapy (Addendum)
[place], [place], [address] Phone: [phone]   Fax:  [phone]  Physical Therapy Treatment  Patient Details  Name: Glenda Burke MRN: [id number] Date of Birth: March 21, 1951 Referring Provider: Dr Dianah Field  Encounter Date: 12/19/2015      PT End of Session - 12/19/15 1109    Visit Number 2   Number of Visits 12   Date for PT Re-Evaluation 01/23/16   PT Start Time 1106  pt 6 min late for appt    PT Stop Time 1153   PT Time Calculation (min) 47 min   Activity Tolerance Patient tolerated treatment well      Past Medical History  Diagnosis Date  . Allergy   . Hypertension   . Menopausal state     Past Surgical History  Procedure Laterality Date  . Arm surgery Left     plate and screw  . Dilation and curettage of uterus    . Polypectomy    . Colonoscopy  ?    out of state    There were no vitals filed for this visit.  Visit Diagnosis:  Bilateral low back pain without sciatica  Stiffness due to immobility  Weakness of both lower extremities  Decreased strength, endurance, and mobility      Subjective Assessment - 12/19/15 1108    Subjective Patient reports that she returned to zumba with some modifications and she went to the food pantry and stood for several hours without trouble. She walked for an hour Friday without difficulty. She is feeling better.     Currently in Pain? No/denies                         [facility] Adult PT Treatment/Exercise - 12/19/15 0001    Lumbar Exercises: Stretches   Passive Hamstring Stretch 3 reps;30 seconds   Press Ups --  10 reps 2-3 sec hold    Piriformis Stretch 3 reps;30 seconds   Lumbar Exercises: Aerobic   Stationary Bike Nustep L5 x 5 min    Lumbar Exercises: Supine   Ab Set --  3 part core 10 sec x 10 reps    Lumbar Exercises: Prone   Other Prone Lumbar Exercises glut set 10 sec  x 10    Knee/Hip Exercises:  Supine   Quad Sets Right;Left;1 set;10 reps  10 sec hold    Straight Leg Raises Right;Left;1 set;10 reps  5 sec hold    Moist Heat Therapy   Number Minutes Moist Heat 15 Minutes   Moist Heat Location Lumbar Spine   Electrical Stimulation   Electrical Stimulation Location bilat L4/5; bilat hips    Electrical Stimulation Action IFC   Electrical Stimulation Parameters to tolerance   Electrical Stimulation Goals Pain;Tone   Manual Therapy   Manual therapy comments pt prone    Joint Mobilization PA glide Lt femur    Soft tissue mobilization deep tissue work through the Lt hip abductors border of sacrum to greater trochanter                 PT Education - 12/19/15 1137    Education provided Yes   Education Details HEP    Person(s) Educated Patient   Methods Explanation;Demonstration;Tactile cues;Verbal cues;Handout   Comprehension Verbalized understanding;Returned demonstration;Verbal cues required;Tactile cues required             PT Long Term Goals - 12/19/15 1153    PT LONG  TERM GOAL #1   Title Improve core strength and stability in order for pt to perform functional activities with less pain 01/23/16   Time 6   Period Weeks   Status On-going   PT LONG TERM GOAL #2   Title Improve strength and stability allowing pt ro perform exercise program and ADL's without difficulty 01/23/16   Time 6   Period Weeks   Status On-going   PT LONG TERM GOAL #3   Title Decrease pain to 0/10 to 2/10 for 80-90% of day to allow pt to perform ADL's and volunteer activities without pain 01/23/16   Time 6   Period Weeks   Status On-going   PT LONG TERM GOAL #4   Title Improve strength bilat hip extension to 5/5 to allow pt to perform ADL's with greater ease 01/23/16   Time 6   Period Weeks   Status On-going   PT LONG TERM GOAL #5   Title I in HEP and return to gym exercise program to improve fitness and strength 01/23/16   Time 6   Period Weeks   Status On-going   PT LONG TERM  GOAL #6   Title Improve FOTO to </= 34% limitatioin 01/23/16   Time 6   Period Weeks   Status On-going               Plan - 12/19/15 1149    Clinical Impression Statement Excellent progress since initial visit  with patient reporting increase in activity level. She has less tightness through Lt/Rt hips and decresaed tenderness to palpation. She continues to have muscular tightness through the Lt hip abductors - glut medius/piriformis area. Patient tolerated exercises well. Will benefit from LE strengthening to allow her to improve body mechanics with squatting, bending and lifting so she does not bend from back. Needs to continue work on core stabilization.    Pt will benefit from skilled therapeutic intervention in order to improve on the following deficits Postural dysfunction;Improper body mechanics;Increased fascial restricitons;Decreased range of motion;Decreased mobility;Decreased strength;Decreased endurance;Decreased activity tolerance;Pain   Rehab Potential Good   PT Frequency 2x / week   PT Duration 6 weeks   PT Treatment/Interventions Patient/family education;ADLs/Self Care Home Management;Therapeutic exercise;Therapeutic activities;Neuromuscular re-education;Manual techniques;Dry needling;Cryotherapy;Electrical Stimulation;Moist Heat;Ultrasound   PT Next Visit Plan progress with lumbar stabilization and core strengthening; spine care education; manual work; modalities as indicated; LE strengthening to improve bending and lifting and prevent LBP/injury.   PT Home Exercise Plan HEP; spine care; myofacial ball release work    Consulted and Agree with Plan of Care Patient        Problem List Patient Active Problem List   Diagnosis Date Noted  . Mass of breast, right 09/01/2015  . Primary osteoarthritis of right knee 06/02/2015  . Farsightedness 06/02/2015  . Axial Low back pain 04/23/2013  . Hyperlipidemia 02/24/2013  . Anemia 02/24/2013  . Essential hypertension, benign  02/18/2013  . Obesity 02/18/2013  . Environmental allergies 02/18/2013  . Annual physical exam 02/18/2013  . Chronic constipation 02/18/2013    Anjalina Bergevin Nilda Simmer PT, MPH  12/19/2015, 11:54 AM  [facility] [place], [place], [address] Phone: [phone]   Fax:  [phone]  Name: Glenda Burke MRN: [id number] Date of Birth: [birth year]    PHYSICAL THERAPY DISCHARGE SUMMARY  Visits from Start of Care: 2  Current functional level related to goals / functional outcomes: Excellent progress with good resolution of symptoms.   Remaining deficits:  Would benefit form continued strengthening to prevent recurrence of symptoms    Education / Equipment: HEP Plan: Patient agrees to discharge.  Patient goals were partially met. Patient is being discharged due to being pleased with the current functional level.  ?????   Duong Haydel P. Helene Kelp PT, MPH 01/10/2016 4:42 PM

## 2015-12-22 ENCOUNTER — Encounter: Payer: BLUE CROSS/BLUE SHIELD | Admitting: Rehabilitative and Restorative Service Providers"

## 2015-12-29 ENCOUNTER — Encounter: Payer: BLUE CROSS/BLUE SHIELD | Admitting: Rehabilitative and Restorative Service Providers"

## 2016-01-23 ENCOUNTER — Ambulatory Visit (INDEPENDENT_AMBULATORY_CARE_PROVIDER_SITE_OTHER): Payer: BLUE CROSS/BLUE SHIELD | Admitting: Family Medicine

## 2016-01-23 ENCOUNTER — Encounter: Payer: Self-pay | Admitting: Family Medicine

## 2016-01-23 VITALS — BP 123/84 | HR 84 | Wt 171.0 lb

## 2016-01-23 DIAGNOSIS — J302 Other seasonal allergic rhinitis: Secondary | ICD-10-CM

## 2016-01-23 MED ORDER — MONTELUKAST SODIUM 10 MG PO TABS: 10.0000 mg | ORAL_TABLET | Freq: Every day | ORAL | Status: DC

## 2016-01-23 NOTE — Progress Notes (Signed)
CC: Glenda Burke is a 65 y.o. female is here for Cough and Nasal Congestion   Subjective: HPI:  Nasal congestion and postnasal drip present for the last 3 or 4 days that is mild in severity and present all hours of the day other than in the evening. It seems to be worse first thing in the morning and accompanied by a nonproductive cough for a few minutes until she is fully awake. She denies any fevers, chills, wheezing, shortness of breath or difficulty swallowing. She's getting benefit from a numbing medication that she sprays in her throat along with a generic form of NyQuil. Symptoms do not seem to be getting worse if anything they're getting somewhat better but she wants no there is an allergy medicine she can take that's prescription strength.   Review Of Systems Outlined In HPI  Past Medical History  Diagnosis Date  . Allergy   . Hypertension   . Menopausal state     Past Surgical History  Procedure Laterality Date  . Arm surgery Left     plate and screw  . Dilation and curettage of uterus    . Polypectomy    . Colonoscopy  ?    out of state   Family History  Problem Relation Age of Onset  . Hypertension Mother   . Hyperlipidemia Mother   . Congestive Heart Failure Mother   . Stroke Father   . Diabetes Father   . Hyperlipidemia Father   . Hypertension Father   . Hypertension Sister   . Hyperlipidemia Sister   . Diabetes Sister   . Kidney disease Brother   . Hypertension Brother   . Hyperlipidemia Brother   . Diabetes Brother   . Colon cancer Neg Hx   . Stomach cancer Neg Hx     Social History   Social History  . Marital Status: Married    Spouse Name: N/A  . Number of Children: N/A  . Years of Education: N/A   Occupational History  . retired    Social History Main Topics  . Smoking status: Never Smoker   . Smokeless tobacco: Never Used  . Alcohol Use: Yes     Comment: wine occasional  . Drug Use: No  . Sexual Activity:    Partners: Male    Other Topics Concern  . Not on file   Social History Narrative     Objective: BP 123/84 mmHg  Pulse 84  Wt 171 lb (77.565 kg)  General: Alert and Oriented, No Acute Distress HEENT: Pupils equal, round, reactive to light. Conjunctivae clear.  External ears unremarkable, canals clear with intact TMs with appropriate landmarks.  Middle ear appears open without effusion. Pink inferior turbinates.  Moist mucous membranes, pharynx without inflammation nor lesions.  Neck supple without palpable lymphadenopathy nor abnormal masses. Lungs:Clear and comfortable work of breathing  Cardiac: Regular rate and rhythm. Normal S1/S2.  No murmurs, rubs, nor gallops.   Extremities: No peripheral edema.  Strong peripheral pulses.  Mental Status: No depression, anxiety, nor agitation. Skin: Warm and dry.  Assessment & Plan: [name] was seen today for cough and nasal congestion.  Diagnoses and all orders for this visit:  Seasonal allergies  Other orders -     montelukast (SINGULAIR) 10 MG tablet; Take 1 tablet (10 mg total) by mouth at bedtime.   Start Singulair for seasonal allergies  Return if symptoms worsen or fail to improve.

## 2016-01-24 ENCOUNTER — Telehealth: Payer: Self-pay

## 2016-01-24 MED ORDER — AZITHROMYCIN 250 MG PO TABS: ORAL_TABLET | ORAL | Status: AC

## 2016-01-24 NOTE — Telephone Encounter (Signed)
Yes,  Rx sent to [facility] pharmacy

## 2016-01-24 NOTE — Telephone Encounter (Signed)
Pt.notified

## 2016-01-24 NOTE — Telephone Encounter (Signed)
Husband would like to know could his wife get a z-pak?  Please adivse.

## 2016-06-18 ENCOUNTER — Other Ambulatory Visit: Payer: Self-pay | Admitting: Sports Medicine

## 2016-06-18 DIAGNOSIS — I10 Essential (primary) hypertension: Secondary | ICD-10-CM

## 2016-08-21 ENCOUNTER — Other Ambulatory Visit: Payer: Self-pay | Admitting: Sports Medicine

## 2016-08-21 DIAGNOSIS — Z1231 Encounter for screening mammogram for malignant neoplasm of breast: Secondary | ICD-10-CM

## 2016-08-28 ENCOUNTER — Ambulatory Visit (INDEPENDENT_AMBULATORY_CARE_PROVIDER_SITE_OTHER): Payer: BLUE CROSS/BLUE SHIELD | Admitting: Sports Medicine

## 2016-08-28 ENCOUNTER — Encounter: Payer: Self-pay | Admitting: Sports Medicine

## 2016-08-28 DIAGNOSIS — B354 Tinea corporis: Secondary | ICD-10-CM

## 2016-08-28 DIAGNOSIS — Z23 Encounter for immunization: Secondary | ICD-10-CM | POA: Diagnosis not present

## 2016-08-28 DIAGNOSIS — Z0000 Encounter for general adult medical examination without abnormal findings: Secondary | ICD-10-CM | POA: Diagnosis not present

## 2016-08-28 DIAGNOSIS — E6609 Other obesity due to excess calories: Secondary | ICD-10-CM

## 2016-08-28 DIAGNOSIS — E7800 Pure hypercholesterolemia, unspecified: Secondary | ICD-10-CM

## 2016-08-28 DIAGNOSIS — D649 Anemia, unspecified: Secondary | ICD-10-CM

## 2016-08-28 DIAGNOSIS — I10 Essential (primary) hypertension: Secondary | ICD-10-CM

## 2016-08-28 LAB — TSH: TSH: 1.72 mIU/L

## 2016-08-28 LAB — LIPID PANEL
Cholesterol: 206 mg/dL — ABNORMAL HIGH (ref <200)
HDL: 62 mg/dL (ref >50)
LDL Cholesterol: 127 mg/dL — ABNORMAL HIGH (ref <100)
Total CHOL/HDL Ratio: 3.3 ratio (ref <5.0)
Triglycerides: 83 mg/dL (ref <150)
VLDL: 17 mg/dL (ref <30)

## 2016-08-28 LAB — CBC
HCT: 34.9 % — ABNORMAL LOW (ref 35.0–45.0)
Hemoglobin: 11.3 g/dL — ABNORMAL LOW (ref 11.7–15.5)
MCH: 28.9 pg (ref 27.0–33.0)
MCHC: 32.4 g/dL (ref 32.0–36.0)
MCV: 89.3 fL (ref 80.0–100.0)
MPV: 11 fL (ref 7.5–12.5)
Platelets: 244 10*3/uL (ref 140–400)
RBC: 3.91 MIL/uL (ref 3.80–5.10)
RDW: 14.5 % (ref 11.0–15.0)
WBC: 7.2 10*3/uL (ref 3.8–10.8)

## 2016-08-28 LAB — FERRITIN: Ferritin: 50 ng/mL (ref 20–288)

## 2016-08-28 LAB — COMPREHENSIVE METABOLIC PANEL
ALT: 22 U/L (ref 6–29)
AST: 27 U/L (ref 10–35)
Albumin: 4 g/dL (ref 3.6–5.1)
BUN: 15 mg/dL (ref 7–25)
Creat: 0.9 mg/dL (ref 0.50–0.99)
Sodium: 140 mmol/L (ref 135–146)
Total Bilirubin: 0.6 mg/dL (ref 0.2–1.2)
Total Protein: 7.2 g/dL (ref 6.1–8.1)

## 2016-08-28 LAB — COMPREHENSIVE METABOLIC PANEL WITH GFR
Alkaline Phosphatase: 92 U/L (ref 33–130)
CO2: 26 mmol/L (ref 20–31)
Calcium: 9.2 mg/dL (ref 8.6–10.4)
Chloride: 104 mmol/L (ref 98–110)
Glucose, Bld: 96 mg/dL (ref 65–99)
Potassium: 3.9 mmol/L (ref 3.5–5.3)

## 2016-08-28 LAB — IRON AND TIBC
%SAT: 21 % (ref 11–50)
Iron: 66 ug/dL (ref 45–160)
TIBC: 309 ug/dL (ref 250–450)
UIBC: 243 ug/dL (ref 125–400)

## 2016-08-28 LAB — FOLATE: Folate: 20.1 ng/mL (ref >5.4)

## 2016-08-28 LAB — VITAMIN B12: Vitamin B-12: 593 pg/mL (ref 200–1100)

## 2016-08-28 MED ORDER — CLOTRIMAZOLE-BETAMETHASONE 1-0.05 % EX CREA: 1.0000 "application " | TOPICAL_CREAM | Freq: Two times a day (BID) | CUTANEOUS | 0 refills | Status: DC

## 2016-08-28 NOTE — Progress Notes (Signed)
  Subjective:    CC: Annual physical exam  HPI:  This is a pleasant 65 year old female, she comes in for complete physical, she has a few questions.  Hypertension: Well controlled  Hyperlipidemia: Recheck in today  Obesity: Is now involved in a [facility] weight loss study.  Skin rash: Left-sided dorsum of the foot, recently Dog sat.  Past medical history:  Negative.  See flowsheet/record as well for more information.  Surgical history: Negative.  See flowsheet/record as well for more information.  Family history: Negative.  See flowsheet/record as well for more information.  Social history: Negative.  See flowsheet/record as well for more information.  Allergies, and medications have been entered into the medical record, reviewed, and no changes needed.    Review of Systems: No headache, visual changes, nausea, vomiting, diarrhea, constipation, dizziness, abdominal pain, skin rash, fevers, chills, night sweats, swollen lymph nodes, weight loss, chest pain, body aches, joint swelling, muscle aches, shortness of breath, mood changes, visual or auditory hallucinations.  Objective:    General: Well Developed, well nourished, and in no acute distress.  Neuro: Alert and oriented x3, extra-ocular muscles intact, sensation grossly intact. Cranial nerves II through XII are intact, motor, sensory, and coordinative functions are all intact. HEENT: Normocephalic, atraumatic, pupils equal round reactive to light, neck supple, no masses, no lymphadenopathy, thyroid nonpalpable. Oropharynx, nasopharynx, external ear canals are unremarkable. Skin: Warm and dry, circular rash over the dorsum of the left foot with a scaly leading edge consistent with body ringworm  Cardiac: Regular rate and rhythm, no murmurs rubs or gallops.  Respiratory: Clear to auscultation bilaterally. Not using accessory muscles, speaking in full sentences.  Abdominal: Soft, nontender, nondistended, positive bowel sounds, no masses, no  organomegaly.  Musculoskeletal: Shoulder, elbow, wrist, hip, knee, ankle stable, and with full range of motion.  Impression and Recommendations:    The patient was counselled, risk factors were discussed, anticipatory guidance given.  Essential hypertension, benign Well-controlled, no changes  Obesity Currently enrolled in at [facility] weight loss study  Annual physical exam Annual physical as above  Hyperlipidemia Rechecking lipid panel  Anemia Rechecking anemia panel  Tinea corporis Left dorsal foot ringworm, adding topical Lotrisone cream

## 2016-08-28 NOTE — Assessment & Plan Note (Signed)
Currently enrolled in at [facility] weight loss study

## 2016-08-28 NOTE — Assessment & Plan Note (Signed)
Rechecking anemia panel

## 2016-08-28 NOTE — Addendum Note (Signed)
Addended by: Isaias Cowman C on: 08/28/2016 10:18 AM   Modules accepted: Orders

## 2016-08-28 NOTE — Assessment & Plan Note (Signed)
Left dorsal foot ringworm, adding topical Lotrisone cream

## 2016-08-28 NOTE — Assessment & Plan Note (Signed)
Well controlled, no changes 

## 2016-08-28 NOTE — Assessment & Plan Note (Signed)
Annual physical as above.  

## 2016-08-28 NOTE — Assessment & Plan Note (Signed)
Rechecking lipid panel 

## 2016-08-29 LAB — HEPATITIS C ANTIBODY: HCV Ab: NEGATIVE

## 2016-08-29 LAB — VITAMIN D 25 HYDROXY (VIT D DEFICIENCY, FRACTURES): Vit D, 25-Hydroxy: 33 ng/mL (ref 30–100)

## 2016-08-29 LAB — HEMOGLOBIN A1C
Hgb A1c MFr Bld: 5.9 % — ABNORMAL HIGH (ref <5.7)
Mean Plasma Glucose: 123 mg/dL

## 2016-08-29 LAB — HIV ANTIBODY (ROUTINE TESTING W REFLEX): HIV 1&2 Ab, 4th Generation: NONREACTIVE

## 2016-11-12 ENCOUNTER — Encounter: Payer: Self-pay | Admitting: Sports Medicine

## 2016-11-12 ENCOUNTER — Ambulatory Visit (INDEPENDENT_AMBULATORY_CARE_PROVIDER_SITE_OTHER): Payer: BLUE CROSS/BLUE SHIELD | Admitting: Sports Medicine

## 2016-11-12 DIAGNOSIS — M65312 Trigger thumb, left thumb: Secondary | ICD-10-CM | POA: Insufficient documentation

## 2016-11-12 NOTE — Patient Instructions (Signed)
Trigger Finger  Trigger finger (digital tendinitis and stenosing tenosynovitis) is a common disorder that causes an often painful catching of the fingers or thumb. It occurs as a clicking, snapping, or locking of a finger in the palm of the hand. This is caused by a problem with the tendons that flex or bend the fingers sliding smoothly through their sheaths. The condition may occur in any finger or a couple fingers at the same time.   The finger may lock with the finger curled or suddenly straighten out with a snap. This is more common in patients with rheumatoid arthritis and diabetes. Left untreated, the condition may get worse to the point where the finger becomes locked in flexion, like making a fist, or less commonly locked with the finger straightened out.  CAUSES    Inflammation and scarring that lead to swelling around the tendon sheath.   Repeated or forceful movements.   Rheumatoid arthritis, an autoimmune disease that affects joints.   Gout.   Diabetes mellitus.  SIGNS AND SYMPTOMS   Soreness and swelling of your finger.   A painful clicking or snapping as you bend and straighten your finger.  DIAGNOSIS   Your health care provider will do a physical exam of your finger to diagnose trigger finger.  TREATMENT    Splinting for 6-8 weeks may be helpful.   Nonsteroidal anti-inflammatory medicines (NSAIDs) can help to relieve the pain and inflammation.   Cortisone injections, along with splinting, may speed up recovery. Several injections may be required. Cortisone may give relief after one injection.   Surgery is another treatment that may be used if conservative treatments do not work. Surgery can be minor, without incisions (a cut does not have to be made), and can be done with a needle through the skin.   Other surgical choices involve an open procedure in which the surgeon opens the hand through a small incision and cuts the pulley so the tendon can again slide smoothly. Your hand will still  work fine.  HOME CARE INSTRUCTIONS   Apply ice to the injured area, twice per day:    Put ice in a plastic bag.    Place a towel between your skin and the bag.    Leave the ice on for 20 minutes, 3-4 times a day.   Rest your hand often.  MAKE SURE YOU:    Understand these instructions.   Will watch your condition.   Will get help right away if you are not doing well or get worse.     This information is not intended to replace advice given to you by your health care provider. Make sure you discuss any questions you have with your health care provider.     Document Released: 07/20/2004 Document Revised: 06/02/2013 Document Reviewed: 03/02/2013  Elsevier Interactive Patient Education 2017 [employer]

## 2016-11-12 NOTE — Progress Notes (Signed)
  Subjective:    CC:  Left thumb pain  HPI: For the past several months this pleasant 69 your old female has had pain under her left thumb, with a catching and popping sensation. 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 Symptoms are moderate, persistent without radiation.  Past medical history:  Negative.  See flowsheet/record as well for more information.  Surgical history: Negative.  See flowsheet/record as well for more information.  Family history: Negative.  See flowsheet/record as well for more information.  Social history: Negative.  See flowsheet/record as well for more information.  Allergies, and medications have been entered into the medical record, reviewed, and no changes needed.   Review of Systems: No fevers, chills, night sweats, weight loss, chest pain, or shortness of breath.   Objective:    General: Well Developed, well nourished, and in no acute distress.  Neuro: Alert and oriented x3, extra-ocular muscles intact, sensation grossly intact.  HEENT: Normocephalic, atraumatic, pupils equal round reactive to light, neck supple, no masses, no lymphadenopathy, thyroid nonpalpable.  Skin: Warm and dry, no rashes. Cardiac: Regular rate and rhythm, no murmurs rubs or gallops, no lower extremity edema.  Respiratory: Clear to auscultation bilaterally. Not using accessory muscles, speaking in full sentences. Left hand: Palpable triggering of the thumb with palpable nodule at the flexor pollicis longus.  Procedure: Real-time Ultrasound Guided Injection of left flexor pollicis longus tendon sheath Device: GE Logiq E  Verbal informed consent obtained.  Time-out conducted.  Noted no overlying erythema, induration, or other signs of local infection.  Skin prepped in a sterile fashion.  Local anesthesia: Topical Ethyl chloride.  With sterile technique and under real time ultrasound guidance:  Injected 1/2 mL kenalog 40, 1/2 mL lidocaine. Completed without difficulty    Pain immediately resolved suggesting accurate placement of the medication.  Advised to call if fevers/chills, erythema, induration, drainage, or persistent bleeding.  Images permanently stored and available for review in the ultrasound unit.  Impression: Technically successful ultrasound guided injection.  Impression and Recommendations:    Trigger thumb of left hand Injection as above, return in one month.

## 2016-11-12 NOTE — Assessment & Plan Note (Signed)
Injection as above, return in one month. 

## 2017-01-02 IMAGING — CR DG LUMBAR SPINE COMPLETE 4+V
5 series · 5 of 5 positions shown · non-contrast
Comparison: Chest x-ray dated October 24, 2014 for purposes of
vertebral level labeling.

CLINICAL DATA: Ten days of low back pain radiating into both hips
and buttocks without known injury

EXAM:
LUMBAR SPINE - COMPLETE 4+ VIEW

[l-spine ap]
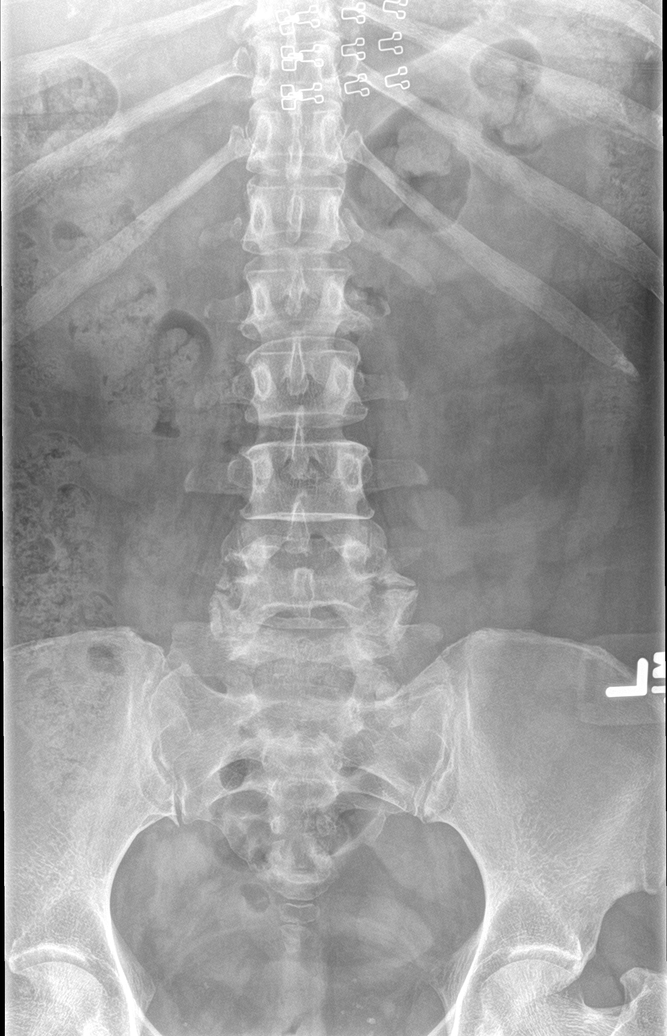

[l-spine obl (1 of 2)]
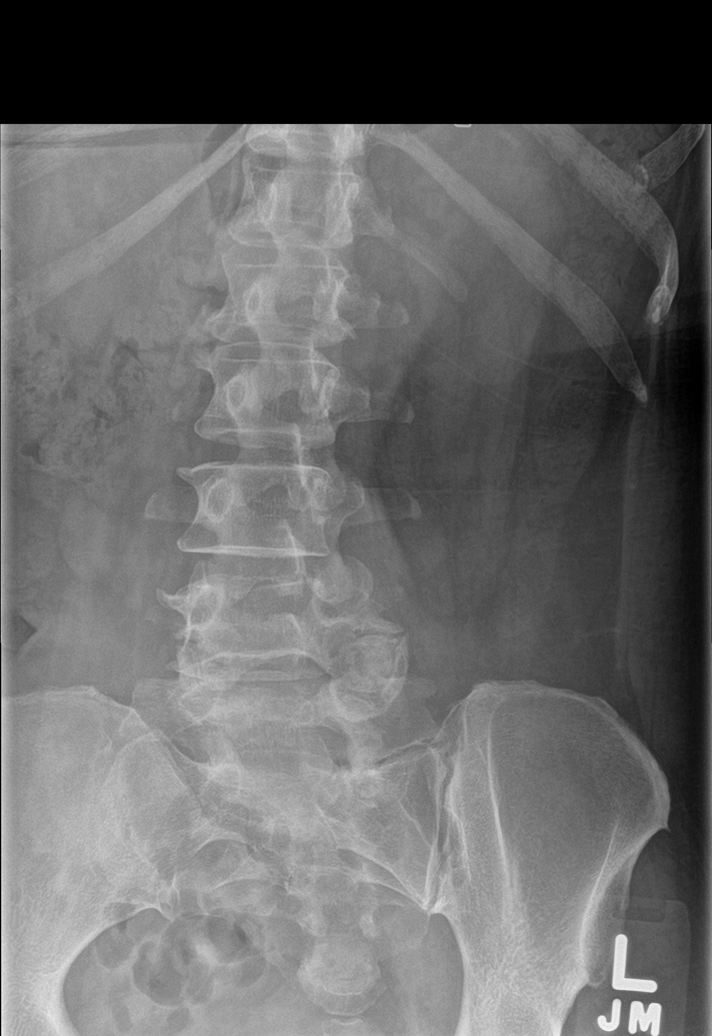

[l-spine obl (2 of 2)]
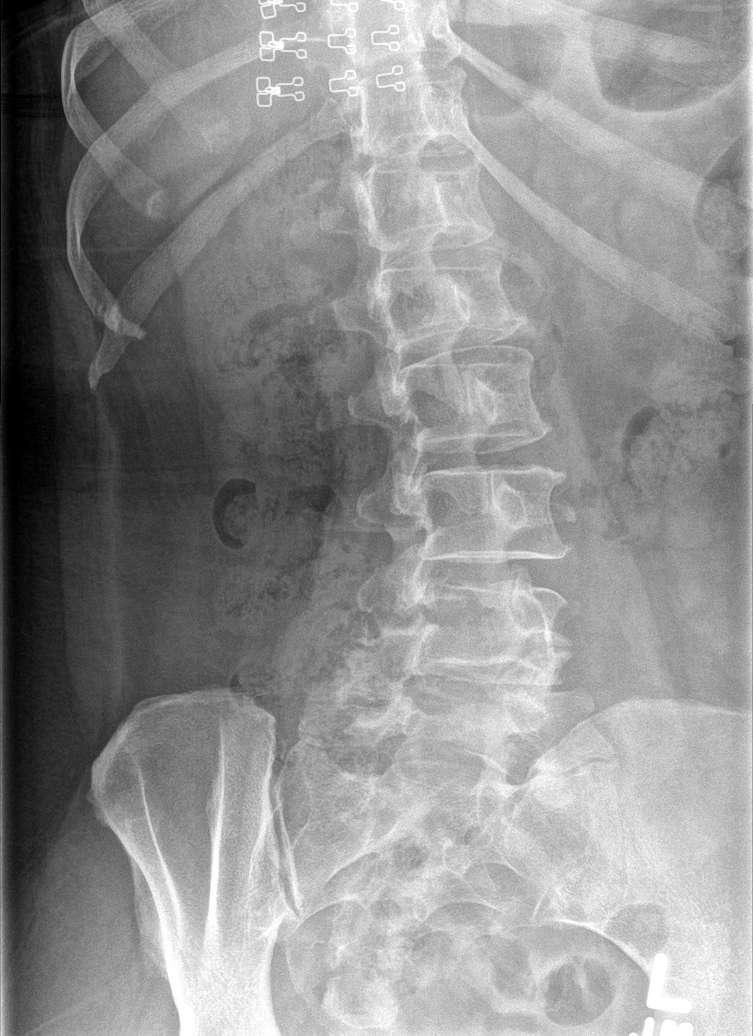

[l-spine lat]
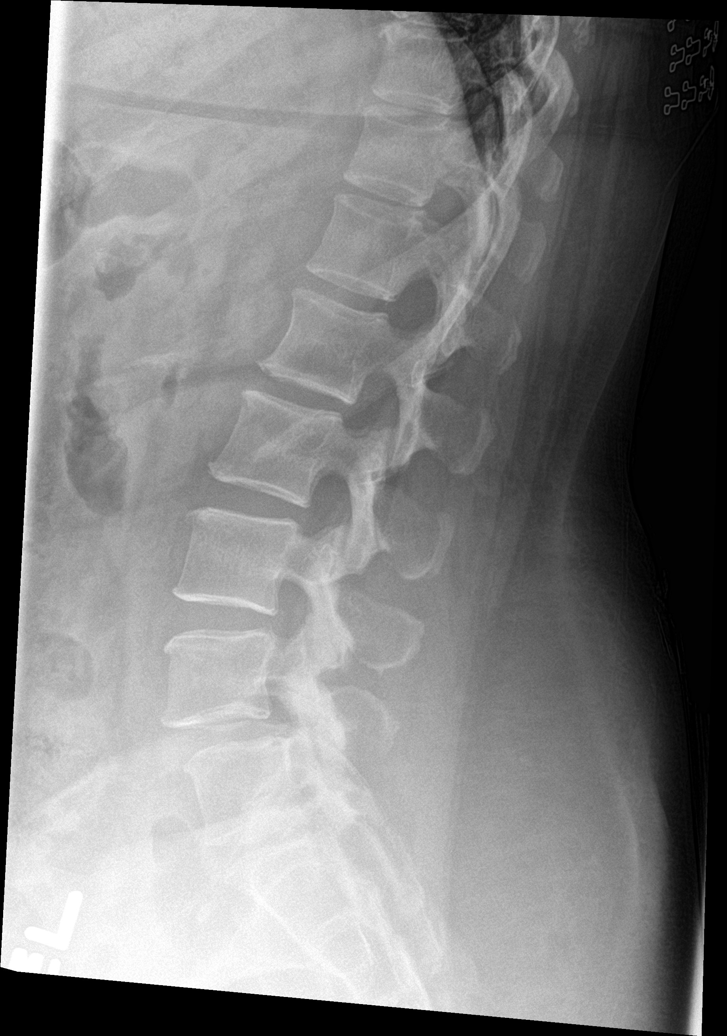

[l-spine spot]
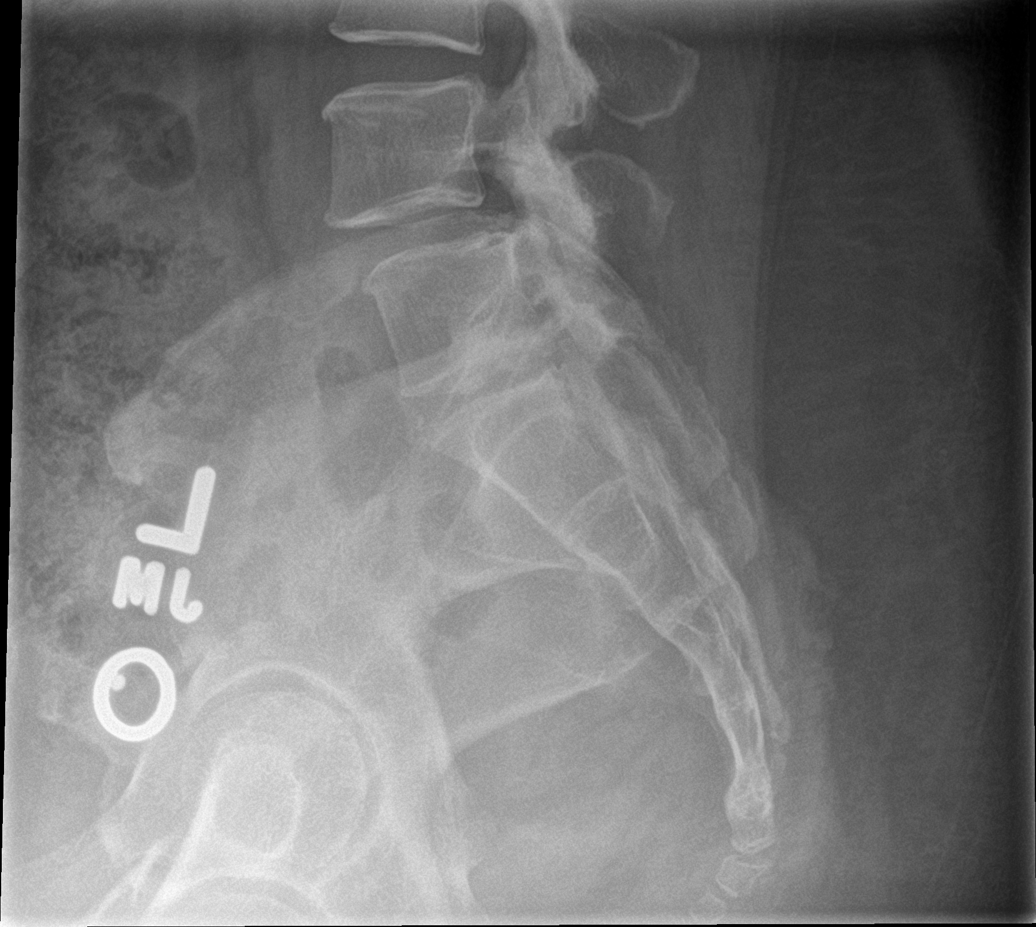

[5 of 5 positions shown; findings below may reference images not displayed]

FINDINGS: The transverse processes of L1 are transitional. S1 is transitional.
The lumbar vertebral bodies are preserved in height. There is grade
1 anterolisthesis of L5 with respect to S1. There is mild
degenerative disc space narrowing at L5-S1. There is facet joint
hypertrophy at L4-5 and S1-S2. No pars defects are demonstrated on
the oblique views. The pedicles and transverse processes appear
intact. There are pseudarthroses bilaterally at S1.
IMPRESSION: Transitional anatomy as described. There is grade 1 anterolisthesis
of L5 with respect S1 likely on the basis of mild degenerative disc
disease and facet joint change. There is facet joint hypertrophy
also at L4-5 S1-S2. There is no compression fracture.

## 2017-04-24 ENCOUNTER — Encounter: Payer: Self-pay | Admitting: Family Medicine

## 2017-04-24 ENCOUNTER — Encounter (INDEPENDENT_AMBULATORY_CARE_PROVIDER_SITE_OTHER): Payer: Self-pay

## 2017-04-24 ENCOUNTER — Ambulatory Visit (INDEPENDENT_AMBULATORY_CARE_PROVIDER_SITE_OTHER): Payer: 59 | Admitting: Family Medicine

## 2017-04-24 VITALS — BP 118/80 | HR 70 | Temp 98.0°F | Resp 16 | Ht 59.0 in | Wt 178.4 lb

## 2017-04-24 DIAGNOSIS — Z0000 Encounter for general adult medical examination without abnormal findings: Secondary | ICD-10-CM | POA: Diagnosis not present

## 2017-04-24 DIAGNOSIS — Z1239 Encounter for other screening for malignant neoplasm of breast: Secondary | ICD-10-CM

## 2017-04-24 DIAGNOSIS — I10 Essential (primary) hypertension: Secondary | ICD-10-CM | POA: Diagnosis not present

## 2017-04-24 DIAGNOSIS — E669 Obesity, unspecified: Secondary | ICD-10-CM

## 2017-04-24 DIAGNOSIS — Z1231 Encounter for screening mammogram for malignant neoplasm of breast: Secondary | ICD-10-CM | POA: Diagnosis not present

## 2017-04-24 MED ORDER — LOSARTAN POTASSIUM-HCTZ 100-25 MG PO TABS: 1.0000 | ORAL_TABLET | Freq: Every day | ORAL | 3 refills | Status: DC

## 2017-04-24 NOTE — Progress Notes (Signed)
Subjective:  Patient ID: Glenda Burke, female    DOB: 12-29-1950  Age: 66 y.o. MRN: [id number]  CC: Establish care.  HPI:  66 year old female with hypertension, chronic constipation, osteoarthritis, anemia, hyperlipidemia, obesity presents to establish care. She is in need of a physical exam.  Preventative Healthcare  Pap smear: Up to date.  Mammogram: In need of.  Colonoscopy: Up to date.  Immunizations  Tetanus - Up to date.  Pneumococcal - Declines.  Zoster - Zostavax previously.  Hepatitis C screening - Done.  Labs: Declines labs today.  Exercise: Exercise regularly.  Alcohol use: See below.  Smoking/tobacco use: No.  STD/HIV testing: Done.  PMH, Surgical Hx, Family Hx, Social History reviewed and updated as below.  Past Medical History:  Diagnosis Date  . Allergy   . Hypertension   . Menopausal state     Past Surgical History:  Procedure Laterality Date  . arm surgery Left    plate and screw  . [place]OSCOPY  ?   [place]  . DILATION AND CURETTAGE OF UTERUS    . polypectomy      Family History  Problem Relation Age of Onset  . Hypertension Mother   . Hyperlipidemia Mother   . Congestive Heart Failure Mother   . Stroke Father   . Diabetes Father   . Hyperlipidemia Father   . Hypertension Father   . Hypertension Sister   . Hyperlipidemia Sister   . Diabetes Sister   . Kidney disease Brother   . Hypertension Brother   . Hyperlipidemia Brother   . Diabetes Brother   . Colon cancer Neg Hx   . Stomach cancer Neg Hx     Social History  Substance Use Topics  . Smoking status: Never Smoker  . Smokeless tobacco: Never Used  . Alcohol use Yes     Comment: wine occasional    Review of Systems  Gastrointestinal: Positive for constipation.  All other systems reviewed and are negative.  Objective:  BP 118/80   Pulse 70   Temp 98 F (36.7 C) (Oral)   Resp 16   Ht 4\' 11"  (1.499 m)   Wt 178 lb 6.4 oz (80.9 kg)   SpO2 98%    BMI 36.03 kg/m   BP/Weight 04/24/2017 11/12/2016 25/36/6440  Systolic BP 347 425 956  Diastolic BP 80 81 82  Wt. (Lbs) 178.4 174 177  BMI 36.03 30.82 31.35    Physical Exam  Constitutional: She is oriented to person, place, and time. She appears well-developed and well-nourished. No distress.  HENT:  Head: Normocephalic and atraumatic.  Nose: Nose normal.  Mouth/Throat: Oropharynx is clear and moist. No oropharyngeal exudate.  Normal TM's bilaterally.   Eyes: Conjunctivae are normal. No scleral icterus.  Neck: Neck supple.  Cardiovascular: Normal rate and regular rhythm.   No murmur heard. Pulmonary/Chest: Effort normal and breath sounds normal. She has no wheezes. She has no rales.  Abdominal: Soft. She exhibits no distension. There is no tenderness. There is no rebound and no guarding.  Musculoskeletal: Normal range of motion. She exhibits no edema.  Lymphadenopathy:    She has no cervical adenopathy.  Neurological: She is alert and oriented to person, place, and time.  Skin: Skin is warm and dry. No rash noted.  Psychiatric: She has a normal mood and affect.  Vitals reviewed.   Lab Results  Component Value Date   WBC 7.2 08/28/2016   HGB 11.3 (L) 08/28/2016   HCT 34.9 (L) 08/28/2016  PLT 244 08/28/2016   GLUCOSE 96 08/28/2016   CHOL 206 (H) 08/28/2016   TRIG 83 08/28/2016   HDL 62 08/28/2016   LDLCALC 127 (H) 08/28/2016   ALT 22 08/28/2016   AST 27 08/28/2016   NA 140 08/28/2016   K 3.9 08/28/2016   CL 104 08/28/2016   CREATININE 0.90 08/28/2016   BUN 15 08/28/2016   CO2 26 08/28/2016   TSH 1.72 08/28/2016   HGBA1C 5.9 (H) 08/28/2016    Assessment & Plan:   Problem List Items Addressed This Visit      Cardiovascular and Mediastinum   Essential hypertension, benign   Relevant Medications   losartan-hydrochlorothiazide (HYZAAR) 100-25 MG tablet     Other   Annual physical exam - Primary    Declines shingles vaccine and pneumococcal vaccine. Mammogram  ordered. Remainder of preventative healthcare up-to-date. Sending to nutrition regarding obesity. Advised continued exercise.       Other Visit Diagnoses    Breast cancer screening       Relevant Orders   MM Digital Screening   Obesity (BMI 30-39.9)       Relevant Orders   Amb ref to Medical Nutrition Therapy-MNT     Meds ordered this encounter  Medications  . losartan-hydrochlorothiazide (HYZAAR) 100-25 MG tablet    Sig: Take 1 tablet by mouth daily.    Dispense:  90 tablet    Refill:  3   Follow-up: PRN  [place]

## 2017-04-24 NOTE — Patient Instructions (Signed)
We will call about the nutritionist.  Follow up annually.  Take care  Dr. Lacinda Axon

## 2017-04-24 NOTE — Assessment & Plan Note (Signed)
Declines shingles vaccine and pneumococcal vaccine. Mammogram ordered. Remainder of preventative healthcare up-to-date. Sending to nutrition regarding obesity. Advised continued exercise.

## 2017-05-05 DIAGNOSIS — H2513 Age-related nuclear cataract, bilateral: Secondary | ICD-10-CM | POA: Diagnosis not present

## 2017-05-05 DIAGNOSIS — H25013 Cortical age-related cataract, bilateral: Secondary | ICD-10-CM | POA: Diagnosis not present

## 2017-05-05 DIAGNOSIS — H524 Presbyopia: Secondary | ICD-10-CM | POA: Diagnosis not present

## 2017-05-05 DIAGNOSIS — H5213 Myopia, bilateral: Secondary | ICD-10-CM | POA: Diagnosis not present

## 2017-05-16 ENCOUNTER — Encounter: Payer: Self-pay | Admitting: Family Medicine

## 2017-05-16 ENCOUNTER — Ambulatory Visit (INDEPENDENT_AMBULATORY_CARE_PROVIDER_SITE_OTHER): Payer: 59 | Admitting: Family Medicine

## 2017-05-16 DIAGNOSIS — S80811A Abrasion, right lower leg, initial encounter: Secondary | ICD-10-CM | POA: Diagnosis not present

## 2017-05-16 NOTE — Progress Notes (Signed)
   Subjective:  Patient ID: Glenda Burke, female    DOB: 04/11/1951  Age: 65 y.o. MRN: [id number]  CC: Fall/abrasion  HPI:  66 year old female presents with the above complaint.  Patient states that on Sunday she was getting out of a large truck and slipped on the curb. She subsequently fell and suffered a large abrasion on her right lower leg. She presents today for evaluation of the wound. She states that it has been draining some fluid. No purulence. Associated swelling. Minimal redness. She's been covering the wound and letting it air out at night. She been applying topical antibiotic ointment. No fevers or chills. No other associated symptoms. No other complaints or concerns at this time.  Social Hx   Social History   Social History  . Marital status: Married    Spouse name: N/A  . Number of children: N/A  . Years of education: N/A   Occupational History  . retired    Social History Main Topics  . Smoking status: Never Smoker  . Smokeless tobacco: Never Used  . Alcohol use Yes     Comment: wine occasional  . Drug use: No  . Sexual activity: Yes    Partners: Male   Other Topics Concern  . None   Social History Narrative  . None    Review of Systems  Constitutional: Negative for fever.  Skin: Positive for wound.   Objective:  BP 110/68 (BP Location: Left Arm, Patient Position: Sitting, Cuff Size: Normal)   Pulse 98   Temp 98.3 F (36.8 C) (Oral)   Resp 12   Wt 180 lb 2 oz (81.7 kg)   SpO2 97%   BMI 36.38 kg/m   BP/Weight 05/16/2017 04/24/2017 6/73/4193  Systolic BP 790 240 973  Diastolic BP 68 80 81  Wt. (Lbs) 180.13 178.4 174  BMI 36.38 36.03 30.82   Physical Exam  Constitutional: She is oriented to person, place, and time. She appears well-developed. No distress.  Pulmonary/Chest: Effort normal. No respiratory distress.  Neurological: She is alert and oriented to person, place, and time.  Skin:  Right lower leg - large abrasion noted. Minimal  erythema. No fluctuance/purulence.  Psychiatric: She has a normal mood and affect. Her behavior is normal.  Vitals reviewed.   Lab Results  Component Value Date   WBC 7.2 08/28/2016   HGB 11.3 (L) 08/28/2016   HCT 34.9 (L) 08/28/2016   PLT 244 08/28/2016   GLUCOSE 96 08/28/2016   CHOL 206 (H) 08/28/2016   TRIG 83 08/28/2016   HDL 62 08/28/2016   LDLCALC 127 (H) 08/28/2016   ALT 22 08/28/2016   AST 27 08/28/2016   NA 140 08/28/2016   K 3.9 08/28/2016   CL 104 08/28/2016   CREATININE 0.90 08/28/2016   BUN 15 08/28/2016   CO2 26 08/28/2016   TSH 1.72 08/28/2016   HGBA1C 5.9 (H) 08/28/2016    Assessment & Plan:   Problem List Items Addressed This Visit    Lower leg abrasion, right, initial encounter    New problem. Healing appropriately. No evidence of infection. Wound cleaned with saline today and dressed with Tegederm. Wound care supplies given to patient.        Follow-up: PRN  [place]

## 2017-05-16 NOTE — Assessment & Plan Note (Signed)
New problem. Healing appropriately. No evidence of infection. Wound cleaned with saline today and dressed with Tegederm. Wound care supplies given to patient.

## 2017-06-02 ENCOUNTER — Ambulatory Visit
Admission: RE | Admit: 2017-06-02 | Discharge: 2017-06-02 | Disposition: A | Discharge status: Home / Self Care | Payer: Medicare HMO | Source: Ambulatory Visit | Attending: Sports Medicine | Admitting: Sports Medicine

## 2017-06-02 DIAGNOSIS — Z1231 Encounter for screening mammogram for malignant neoplasm of breast: Secondary | ICD-10-CM | POA: Insufficient documentation

## 2017-06-05 DIAGNOSIS — Z0101 Encounter for examination of eyes and vision with abnormal findings: Secondary | ICD-10-CM | POA: Diagnosis not present

## 2017-11-04 ENCOUNTER — Other Ambulatory Visit: Payer: Self-pay | Admitting: Family Medicine

## 2017-11-04 DIAGNOSIS — I10 Essential (primary) hypertension: Secondary | ICD-10-CM

## 2017-12-11 ENCOUNTER — Ambulatory Visit (INDEPENDENT_AMBULATORY_CARE_PROVIDER_SITE_OTHER): Payer: Medicare HMO | Admitting: Internal Medicine

## 2017-12-11 ENCOUNTER — Encounter: Payer: Self-pay | Admitting: Internal Medicine

## 2017-12-11 VITALS — BP 100/64 | HR 80 | Temp 98.3°F | Ht 60.0 in | Wt 181.6 lb

## 2017-12-11 DIAGNOSIS — M25562 Pain in left knee: Secondary | ICD-10-CM | POA: Diagnosis not present

## 2017-12-11 DIAGNOSIS — E119 Type 2 diabetes mellitus without complications: Secondary | ICD-10-CM | POA: Insufficient documentation

## 2017-12-11 DIAGNOSIS — G8929 Other chronic pain: Secondary | ICD-10-CM

## 2017-12-11 DIAGNOSIS — E669 Obesity, unspecified: Secondary | ICD-10-CM | POA: Diagnosis not present

## 2017-12-11 DIAGNOSIS — I10 Essential (primary) hypertension: Secondary | ICD-10-CM | POA: Diagnosis not present

## 2017-12-11 DIAGNOSIS — E559 Vitamin D deficiency, unspecified: Secondary | ICD-10-CM

## 2017-12-11 DIAGNOSIS — M170 Bilateral primary osteoarthritis of knee: Secondary | ICD-10-CM | POA: Insufficient documentation

## 2017-12-11 DIAGNOSIS — Z1329 Encounter for screening for other suspected endocrine disorder: Secondary | ICD-10-CM

## 2017-12-11 DIAGNOSIS — R7303 Prediabetes: Secondary | ICD-10-CM | POA: Diagnosis not present

## 2017-12-11 DIAGNOSIS — E785 Hyperlipidemia, unspecified: Secondary | ICD-10-CM | POA: Diagnosis not present

## 2017-12-11 DIAGNOSIS — D649 Anemia, unspecified: Secondary | ICD-10-CM

## 2017-12-11 DIAGNOSIS — M25561 Pain in right knee: Secondary | ICD-10-CM | POA: Diagnosis not present

## 2017-12-11 NOTE — Progress Notes (Signed)
Chief Complaint  Patient presents with  . Follow-up   Follow up  1. HTN controlled on hyzaar 100/25 qd  2. C/w weight gain 40 lbs used to weight 140 in mid 50s she is active with exercise (I.e zumba, weights, water aerobics) and eating healthy diet and limiting calories to 1200 mg qd but not losing wt. She also has prediabetes, HLD and wants to disc with nutrition   Review of Systems  Constitutional: Negative for weight loss.  HENT: Negative for hearing loss.   Eyes: Negative for blurred vision.  Respiratory: Negative for shortness of breath.   Cardiovascular: Negative for chest pain.  Gastrointestinal: Negative for abdominal pain.  Musculoskeletal: Positive for joint pain.       +chronic knee pain   Skin: Negative for rash.  Neurological: Negative for headaches.  Psychiatric/Behavioral: Negative for memory loss.   Past Medical History:  Diagnosis Date  . Allergy   . Hypertension   . Menopausal state    Past Surgical History:  Procedure Laterality Date  . arm surgery Left    plate and screw  . COLONOSCOPY  ?   [place] of state  . DILATION AND CURETTAGE OF UTERUS    . polypectomy     Family History  Problem Relation Age of Onset  . Hypertension Mother   . Hyperlipidemia Mother   . Congestive Heart Failure Mother   . Stroke Father   . Diabetes Father   . Hyperlipidemia Father   . Hypertension Father   . Hypertension Sister   . Hyperlipidemia Sister   . Diabetes Sister   . Kidney disease Brother   . Hypertension Brother   . Hyperlipidemia Brother   . Diabetes Brother   . Colon cancer Neg Hx   . Stomach cancer Neg Hx    Social History   Socioeconomic History  . Marital status: Married    Spouse name: Not on file  . Number of children: Not on file  . Years of education: Not on file  . Highest education level: Not on file  Social Needs  . Financial resource strain: Not on file  . Food insecurity - worry: Not on file  . Food insecurity - inability: Not on file   . Transportation needs - medical: Not on file  . Transportation needs - non-medical: Not on file  Occupational History  . Occupation: retired  Tobacco Use  . Smoking status: Never Smoker  . Smokeless tobacco: Never Used  Substance and Sexual Activity  . Alcohol use: Yes    Comment: wine occasional  . Drug use: No  . Sexual activity: Yes    Partners: Male  Other Topics Concern  . Not on file  Social History Narrative  . Not on file   Current Meds  Medication Sig  . aspirin 81 MG tablet Take 81 mg by mouth 3 (three) times a week.  . fluticasone (FLONASE) 50 MCG/ACT nasal spray One spray in each nostril twice a day, use left hand for right nostril, and right hand for left nostril.  Marland Kitchen loratadine (CLARITIN) 10 MG tablet Take 1 tablet (10 mg total) by mouth daily.  Marland Kitchen losartan-hydrochlorothiazide (HYZAAR) 100-25 MG tablet TAKE 1 TABLET BY MOUTH EVERY DAY  . montelukast (SINGULAIR) 10 MG tablet Take 1 tablet (10 mg total) by mouth at bedtime.   Allergies  Allergen Reactions  . Hydrocodone Swelling   No results found for this or any previous visit (from the past 2160 hour(s)). Objective  Body  mass index is 35.47 kg/m. Wt Readings from Last 3 Encounters:  12/11/17 181 lb 9.6 oz (82.4 kg)  05/16/17 180 lb 2 oz (81.7 kg)  04/24/17 178 lb 6.4 oz (80.9 kg)   Temp Readings from Last 3 Encounters:  12/11/17 98.3 F (36.8 C) (Oral)  05/16/17 98.3 F (36.8 C) (Oral)  04/24/17 98 F (36.7 C) (Oral)   BP Readings from Last 3 Encounters:  12/11/17 100/64  05/16/17 110/68  04/24/17 118/80   Pulse Readings from Last 3 Encounters:  12/11/17 80  05/16/17 98  04/24/17 70   O2 sat room air 98%  Physical Exam  Constitutional: She is oriented to person, place, and time and well-developed, well-nourished, and in no distress. Vital signs are normal.  HENT:  Head: Normocephalic and atraumatic.  Mouth/Throat: Oropharynx is clear and moist and mucous membranes are normal.  Eyes:  Conjunctivae are normal. Pupils are equal, round, and reactive to light.  Cardiovascular: Normal rate, regular rhythm and normal heart sounds.  Pulmonary/Chest: Effort normal and breath sounds normal.  Abdominal: Soft. Bowel sounds are normal.  Neurological: She is alert and oriented to person, place, and time. Gait normal. Gait normal.  Skin: Skin is warm, dry and intact.  Psychiatric: Mood, memory, affect and judgment normal.  Nursing note and vitals reviewed.   Assessment   1. HTN/HLD  2. Prediabetes  3. Obesity BMI >35  4. Chronic b/l knee pain with mod to severe arthritis knees R>L  5. HM  Plan  1, 2, 3. Check labs CMET, CBC, lipid, A1C, UA, TSH, T4, anemia panel Cont meds  Continue exercise to lose  Refer to nutrition  4. Monitor  5.  Declines flu shot  Tdap had zoster had Given info about shingrix and prevnar and pna 23 today  Hep c, hiv neg   Will do pap at f/u  dexa 2014 neg  mammlo due 05/2018 last 05/2017 neg  Colonoscopy 07/2013 tubular polyp x 1 repeat 07/2018   Spent >30 minutes in room with pt devising tx plan and disc PMH, FH.   Provider: Dr. Olivia Mackie McLean-Scocuzza-[facility] Medicine

## 2017-12-11 NOTE — Patient Instructions (Addendum)
F/u in 3-4 weeks pap  Labs next Wednesday   Diabetes Mellitus and Nutrition When you have diabetes (diabetes mellitus), it is very important to have healthy eating habits because your blood sugar (glucose) levels are greatly affected by what you eat and drink. Eating healthy foods in the appropriate amounts, at about the same times every day, can help you:  Control your blood glucose.  Lower your risk of heart disease.  Improve your blood pressure.  Reach or maintain a healthy weight.  Every person with diabetes is different, and each person has different needs for a meal plan. Your health care provider may recommend that you work with a diet and nutrition specialist (dietitian) to make a meal plan that is best for you. Your meal plan may vary depending on factors such as:  The calories you need.  The medicines you take.  Your weight.  Your blood glucose, blood pressure, and cholesterol levels.  Your activity level.  Other health conditions you have, such as heart or kidney disease.  How do carbohydrates affect me? Carbohydrates affect your blood glucose level more than any other type of food. Eating carbohydrates naturally increases the amount of glucose in your blood. Carbohydrate counting is a method for keeping track of how many carbohydrates you eat. Counting carbohydrates is important to keep your blood glucose at a healthy level, especially if you use insulin or take certain oral diabetes medicines. It is important to know how many carbohydrates you can safely have in each meal. This is different for every person. Your dietitian can help you calculate how many carbohydrates you should have at each meal and for snack. Foods that contain carbohydrates include:  Bread, cereal, rice, pasta, and crackers.  Potatoes and corn.  Peas, beans, and lentils.  Milk and yogurt.  Fruit and juice.  Desserts, such as cakes, cookies, ice cream, and candy.  How does alcohol affect  me? Alcohol can cause a sudden decrease in blood glucose (hypoglycemia), especially if you use insulin or take certain oral diabetes medicines. Hypoglycemia can be a life-threatening condition. Symptoms of hypoglycemia (sleepiness, dizziness, and confusion) are similar to symptoms of having too much alcohol. If your health care provider says that alcohol is safe for you, follow these guidelines:  Limit alcohol intake to no more than 1 drink per day for nonpregnant women and 2 drinks per day for men. One drink equals 12 oz of beer, 5 oz of wine, or 1 oz of hard liquor.  Do not drink on an empty stomach.  Keep yourself hydrated with water, diet soda, or unsweetened iced tea.  Keep in mind that regular soda, juice, and other mixers may contain a lot of sugar and must be counted as carbohydrates.  What are tips for following this plan? Reading food labels  Start by checking the serving size on the label. The amount of calories, carbohydrates, fats, and other nutrients listed on the label are based on one serving of the food. Many foods contain more than one serving per package.  Check the total grams (g) of carbohydrates in one serving. You can calculate the number of servings of carbohydrates in one serving by dividing the total carbohydrates by 15. For example, if a food has 30 g of total carbohydrates, it would be equal to 2 servings of carbohydrates.  Check the number of grams (g) of saturated and trans fats in one serving. Choose foods that have low or no amount of these fats.  Check the  number of milligrams (mg) of sodium in one serving. Most people should limit total sodium intake to less than 2,300 mg per day.  Always check the nutrition information of foods labeled as "low-fat" or "nonfat". These foods may be higher in added sugar or refined carbohydrates and should be avoided.  Talk to your dietitian to identify your daily goals for nutrients listed on the label. Shopping  Avoid  buying canned, premade, or processed foods. These foods tend to be high in fat, sodium, and added sugar.  Shop around the outside edge of the grocery store. This includes fresh fruits and vegetables, bulk grains, fresh meats, and fresh dairy. Cooking  Use low-heat cooking methods, such as baking, instead of high-heat cooking methods like deep frying.  Cook using healthy oils, such as olive, canola, or sunflower oil.  Avoid cooking with butter, cream, or high-fat meats. Meal planning  Eat meals and snacks regularly, preferably at the same times every day. Avoid going long periods of time without eating.  Eat foods high in fiber, such as fresh fruits, vegetables, beans, and whole grains. Talk to your dietitian about how many servings of carbohydrates you can eat at each meal.  Eat 4-6 ounces of lean protein each day, such as lean meat, chicken, fish, eggs, or tofu. 1 ounce is equal to 1 ounce of meat, chicken, or fish, 1 egg, or 1/4 cup of tofu.  Eat some foods each day that contain healthy fats, such as avocado, nuts, seeds, and fish. Lifestyle   Check your blood glucose regularly.  Exercise at least 30 minutes 5 or more days each week, or as told by your health care provider.  Take medicines as told by your health care provider.  Do not use any products that contain nicotine or tobacco, such as cigarettes and e-cigarettes. If you need help quitting, ask your health care provider.  Work with a Social worker or diabetes educator to identify strategies to manage stress and any emotional and social challenges. What are some questions to ask my health care provider?  Do I need to meet with a diabetes educator?  Do I need to meet with a dietitian?  What number can I call if I have questions?  When are the best times to check my blood glucose? Where to find more information:  American Diabetes Association: diabetes.org/food-and-fitness/food  Academy of Nutrition and Dietetics:  PokerClues.dk  [employer] of Diabetes and Digestive and Kidney Diseases (NIH): ContactWire.be Summary  A healthy meal plan will help you control your blood glucose and maintain a healthy lifestyle.  Working with a diet and nutrition specialist (dietitian) can help you make a meal plan that is best for you.  Keep in mind that carbohydrates and alcohol have immediate effects on your blood glucose levels. It is important to count carbohydrates and to use alcohol carefully. This information is not intended to replace advice given to you by your health care provider. Make sure you discuss any questions you have with your health care provider. Document Released: 06/27/2005 Document Revised: 11/04/2016 Document Reviewed: 11/04/2016 Elsevier Interactive Patient Education  2018 [place].   Pneumococcal Conjugate Vaccine (PCV13) What You Need to Know 1. Why get vaccinated? Vaccination can protect both children and adults from pneumococcal disease. Pneumococcal disease is caused by bacteria that can spread from person to person through close contact. It can cause ear infections, and it can also lead to more serious infections of the:  Lungs (pneumonia),  Blood (bacteremia), and  Covering  of the brain and spinal cord (meningitis).  Pneumococcal pneumonia is most common among adults. Pneumococcal meningitis can cause deafness and brain damage, and it kills about 1 child in 10 who get it. Anyone can get pneumococcal disease, but children under 44 years of age and adults 33 years and older, people with certain medical conditions, and cigarette smokers are at the highest risk. Before there was a vaccine, the [place] saw:  more than 700 cases of meningitis,  about 13,000 blood infections,  about 5 million ear infections, and  about 200 deaths  in children  under 5 each year from pneumococcal disease. Since vaccine became available, severe pneumococcal disease in these children has fallen by 88%. About 18,000 older adults die of pneumococcal disease each year in the [place]. Treatment of pneumococcal infections with penicillin and other drugs is not as effective as it used to be, because some strains of the disease have become resistant to these drugs. This makes prevention of the disease, through vaccination, even more important. 2. PCV13 vaccine Pneumococcal conjugate vaccine (called PCV13) protects against 13 types of pneumococcal bacteria. PCV13 is routinely given to children at 2, 4, 6, and 44-77 months of age. It is also recommended for children and adults 80 to 61 years of age with certain health conditions, and for all adults 33 years of age and older. Your doctor can give you details. 3. Some people should not get this vaccine Anyone who has ever had a life-threatening allergic reaction to a dose of this vaccine, to an earlier pneumococcal vaccine called PCV7, or to any vaccine containing diphtheria toxoid (for example, DTaP), should not get PCV13. Anyone with a severe allergy to any component of PCV13 should not get the vaccine. Tell your doctor if the person being vaccinated has any severe allergies. If the person scheduled for vaccination is not feeling well, your healthcare provider might decide to reschedule the shot on another day. 4. Risks of a vaccine reaction With any medicine, including vaccines, there is a chance of reactions. These are usually mild and go away on their own, but serious reactions are also possible. Problems reported following PCV13 varied by age and dose in the series. The most common problems reported among children were:  About half became drowsy after the shot, had a temporary loss of appetite, or had redness or tenderness where the shot was given.  About 1 out of 3 had swelling where the shot was  given.  About 1 out of 3 had a mild fever, and about 1 in 20 had a fever over 102.57F.  Up to about 8 out of 10 became fussy or irritable.  Adults have reported pain, redness, and swelling where the shot was given; also mild fever, fatigue, headache, chills, or muscle pain. Young children who get PCV13 along with inactivated flu vaccine at the same time may be at increased risk for seizures caused by fever. Ask your doctor for more information. Problems that could happen after any vaccine:  People sometimes faint after a medical procedure, including vaccination. Sitting or lying down for about 15 minutes can help prevent fainting, and injuries caused by a fall. Tell your doctor if you feel dizzy, or have vision changes or ringing in the ears.  Some older children and adults get severe pain in the shoulder and have difficulty moving the arm where a shot was given. This happens very rarely.  Any medication can cause a severe allergic reaction. Such reactions from a  vaccine are very rare, estimated at about 1 in a million doses, and would happen within a few minutes to a few hours after the vaccination. As with any medicine, there is a very small chance of a vaccine causing a serious injury or death. The safety of vaccines is always being monitored. For more information, visit: http://www.aguilar.org/ 5. What if there is a serious reaction? What should I look for? Look for anything that concerns you, such as signs of a severe allergic reaction, very high fever, or unusual behavior. Signs of a severe allergic reaction can include hives, swelling of the face and throat, difficulty breathing, a fast heartbeat, dizziness, and weakness-usually within a few minutes to a few hours after the vaccination. What should I do?  If you think it is a severe allergic reaction or other emergency that can't wait, call 9-1-1 or get the person to the nearest hospital. Otherwise, call your doctor.  Reactions  should be reported to the Vaccine Adverse Event Reporting System (VAERS). Your doctor should file this report, or you can do it yourself through the VAERS web site at www.vaers.SamedayNews.es, or by calling 337 563 6794. ? VAERS does not give medical advice. 6. The National Vaccine Injury Compensation Program The [employer] Vaccine Injury Compensation Program (VICP) is a federal program that was created to compensate people who may have been injured by certain vaccines. Persons who believe they may have been injured by a vaccine can learn about the program and about filing a claim by calling 440-164-1389 or visiting the [place] website at GoldCloset.com.ee. There is a time limit to file a claim for compensation. 7. How can I learn more?  Ask your healthcare provider. He or she can give you the vaccine package insert or suggest other sources of information.  Call your local or state health department.  Contact the Centers for Disease Control and Prevention (CDC): ? Call 701 329 0417 (1-800-CDC-INFO) or ? Visit CDC's website at http://hunter.com/ Vaccine Information Statement, PCV13 Vaccine (08/18/2014) This information is not intended to replace advice given to you by your health care provider. Make sure you discuss any questions you have with your health care provider. Document Released: 07/28/2006 Document Revised: 06/20/2016 Document Reviewed: 06/20/2016 Elsevier Interactive Patient Education  2017 [place]. Pneumococcal Conjugate Vaccine suspension for injection What is this medicine? PNEUMOCOCCAL VACCINE (NEU mo KOK al vak SEEN) is a vaccine used to prevent pneumococcus bacterial infections. These bacteria can cause serious infections like pneumonia, meningitis, and blood infections. This vaccine will lower your chance of getting pneumonia. If you do get pneumonia, it can make your symptoms milder and your illness shorter. This vaccine will not treat an infection and will not  cause infection. This vaccine is recommended for infants and young children, adults with certain medical conditions, and adults 72 years or older. This medicine may be used for other purposes; ask your health care provider or pharmacist if you have questions. COMMON BRAND NAME(S): Prevnar, Prevnar 13 What should I tell my health care provider before I take this medicine? They need to know if you have any of these conditions: -bleeding problems -fever -immune system problems -an unusual or allergic reaction to pneumococcal vaccine, diphtheria toxoid, other vaccines, latex, other medicines, foods, dyes, or preservatives -pregnant or trying to get pregnant -breast-feeding How should I use this medicine? This vaccine is for injection into a muscle. It is given by a health care professional. A copy of Vaccine Information Statements will be given before each vaccination. Read this sheet carefully  each time. The sheet may change frequently. Talk to your pediatrician regarding the use of this medicine in children. While this drug may be prescribed for children as young as 60 weeks old for selected conditions, precautions do apply. Overdosage: If you think you have taken too much of this medicine contact a poison control center or emergency room at once. NOTE: This medicine is only for you. Do not share this medicine with others. What if I miss a dose? It is important not to miss your dose. Call your doctor or health care professional if you are unable to keep an appointment. What may interact with this medicine? -medicines for cancer chemotherapy -medicines that suppress your immune function -steroid medicines like prednisone or cortisone This list may not describe all possible interactions. Give your health care provider a list of all the medicines, herbs, non-prescription drugs, or dietary supplements you use. Also tell them if you smoke, drink alcohol, or use illegal drugs. Some items may interact  with your medicine. What should I watch for while using this medicine? Mild fever and pain should go away in 3 days or less. Report any unusual symptoms to your doctor or health care professional. What side effects may I notice from receiving this medicine? Side effects that you should report to your doctor or health care professional as soon as possible: -allergic reactions like skin rash, itching or hives, swelling of the face, lips, or tongue -breathing problems -confused -fast or irregular heartbeat -fever over 102 degrees F -seizures -unusual bleeding or bruising -unusual muscle weakness Side effects that usually do not require medical attention (report to your doctor or health care professional if they continue or are bothersome): -aches and pains -diarrhea -fever of 102 degrees F or less -headache -irritable -loss of appetite -pain, tender at site where injected -trouble sleeping This list may not describe all possible side effects. Call your doctor for medical advice about side effects. You may report side effects to FDA at 1-800-FDA-1088. Where should I keep my medicine? This does not apply. This vaccine is given in a clinic, pharmacy, doctor's office, or other health care setting and will not be stored at home. NOTE: This sheet is a summary. It may not cover all possible information. If you have questions about this medicine, talk to your doctor, pharmacist, or health care provider.  2018 Elsevier/Gold Standard (2014-07-07 10:27:27)

## 2017-12-11 NOTE — Progress Notes (Signed)
Pre visit review using our clinic review tool, if applicable. No additional management support is needed unless otherwise documented below in the visit note. 

## 2017-12-17 ENCOUNTER — Other Ambulatory Visit (INDEPENDENT_AMBULATORY_CARE_PROVIDER_SITE_OTHER): Payer: Medicare HMO

## 2017-12-17 DIAGNOSIS — I10 Essential (primary) hypertension: Secondary | ICD-10-CM | POA: Diagnosis not present

## 2017-12-17 DIAGNOSIS — Z1329 Encounter for screening for other suspected endocrine disorder: Secondary | ICD-10-CM

## 2017-12-17 DIAGNOSIS — E785 Hyperlipidemia, unspecified: Secondary | ICD-10-CM

## 2017-12-17 DIAGNOSIS — R7303 Prediabetes: Secondary | ICD-10-CM | POA: Diagnosis not present

## 2017-12-17 DIAGNOSIS — E559 Vitamin D deficiency, unspecified: Secondary | ICD-10-CM | POA: Diagnosis not present

## 2017-12-17 DIAGNOSIS — D649 Anemia, unspecified: Secondary | ICD-10-CM | POA: Diagnosis not present

## 2017-12-17 LAB — LIPID PANEL
CHOLESTEROL: 191 mg/dL (ref 0–200)
HDL: 56.6 mg/dL (ref >39.00)
LDL CALC: 120 mg/dL — AB (ref 0–99)
NonHDL: 134.78
TRIGLYCERIDES: 75 mg/dL (ref 0.0–149.0)
Total CHOL/HDL Ratio: 3
VLDL: 15 mg/dL (ref 0.0–40.0)

## 2017-12-17 LAB — URINALYSIS, ROUTINE W REFLEX MICROSCOPIC
Bilirubin Urine: NEGATIVE
Ketones, ur: NEGATIVE
Leukocytes, UA: NEGATIVE
Nitrite: NEGATIVE
SPECIFIC GRAVITY, URINE: 1.025 (ref 1.000–1.030)
Total Protein, Urine: NEGATIVE
URINE GLUCOSE: NEGATIVE
Urobilinogen, UA: 0.2 (ref 0.0–1.0)
pH: 5.5 (ref 5.0–8.0)

## 2017-12-17 LAB — COMPREHENSIVE METABOLIC PANEL
ALT: 19 U/L (ref 0–35)
AST: 15 U/L (ref 0–37)
Albumin: 3.8 g/dL (ref 3.5–5.2)
Alkaline Phosphatase: 108 U/L (ref 39–117)
BUN: 22 mg/dL (ref 6–23)
CALCIUM: 9.5 mg/dL (ref 8.4–10.5)
CHLORIDE: 103 meq/L (ref 96–112)
CO2: 29 meq/L (ref 19–32)
Creatinine, Ser: 0.92 mg/dL (ref 0.40–1.20)
GFR: 64.98 mL/min (ref >60.00)
Glucose, Bld: 99 mg/dL (ref 70–99)
POTASSIUM: 3.5 meq/L (ref 3.5–5.1)
Sodium: 139 mEq/L (ref 135–145)
Total Bilirubin: 0.6 mg/dL (ref 0.2–1.2)
Total Protein: 7.4 g/dL (ref 6.0–8.3)

## 2017-12-17 LAB — CBC WITH DIFFERENTIAL/PLATELET
BASOS PCT: 0.6 % (ref 0.0–3.0)
Basophils Absolute: 0 10*3/uL (ref 0.0–0.1)
EOS PCT: 2.4 % (ref 0.0–5.0)
Eosinophils Absolute: 0.2 10*3/uL (ref 0.0–0.7)
HCT: 34.8 % — ABNORMAL LOW (ref 36.0–46.0)
Hemoglobin: 11.5 g/dL — ABNORMAL LOW (ref 12.0–15.0)
Lymphocytes Relative: 32.1 % (ref 12.0–46.0)
Lymphs Abs: 2.1 10*3/uL (ref 0.7–4.0)
MCHC: 33.2 g/dL (ref 30.0–36.0)
MCV: 86.9 fl (ref 78.0–100.0)
MONO ABS: 0.6 10*3/uL (ref 0.1–1.0)
Monocytes Relative: 9.4 % (ref 3.0–12.0)
NEUTROS ABS: 3.7 10*3/uL (ref 1.4–7.7)
Neutrophils Relative %: 55.5 % (ref 43.0–77.0)
PLATELETS: 230 10*3/uL (ref 150.0–400.0)
RBC: 4.01 Mil/uL (ref 3.87–5.11)
RDW: 14.2 % (ref 11.5–15.5)
WBC: 6.7 10*3/uL (ref 4.0–10.5)

## 2017-12-17 LAB — HEMOGLOBIN A1C: HEMOGLOBIN A1C: 6.4 % (ref 4.6–6.5)

## 2017-12-17 LAB — VITAMIN D 25 HYDROXY (VIT D DEFICIENCY, FRACTURES): VITD: 30.01 ng/mL (ref 30.00–100.00)

## 2017-12-17 LAB — TSH: TSH: 1.56 u[IU]/mL (ref 0.35–4.50)

## 2017-12-18 LAB — IRON,TIBC AND FERRITIN PANEL
%SAT: 24 % (ref 11–50)
FERRITIN: 56 ng/mL (ref 20–288)
IRON: 70 ug/dL (ref 45–160)
TIBC: 286 ug/dL (ref 250–450)

## 2018-01-16 ENCOUNTER — Ambulatory Visit (INDEPENDENT_AMBULATORY_CARE_PROVIDER_SITE_OTHER): Payer: Medicare HMO | Admitting: Internal Medicine

## 2018-01-16 ENCOUNTER — Other Ambulatory Visit (HOSPITAL_COMMUNITY)
Admission: RE | Admit: 2018-01-16 | Discharge: 2018-01-16 | Disposition: A | Discharge status: Home / Self Care | Payer: Medicare HMO | Source: Ambulatory Visit | Attending: Internal Medicine | Admitting: Internal Medicine

## 2018-01-16 ENCOUNTER — Encounter: Payer: Self-pay | Admitting: Internal Medicine

## 2018-01-16 VITALS — BP 110/78 | HR 79 | Temp 98.3°F | Ht 60.0 in | Wt 181.0 lb

## 2018-01-16 DIAGNOSIS — I10 Essential (primary) hypertension: Secondary | ICD-10-CM | POA: Diagnosis not present

## 2018-01-16 DIAGNOSIS — L309 Dermatitis, unspecified: Secondary | ICD-10-CM

## 2018-01-16 DIAGNOSIS — Z1151 Encounter for screening for human papillomavirus (HPV): Secondary | ICD-10-CM | POA: Insufficient documentation

## 2018-01-16 DIAGNOSIS — D126 Benign neoplasm of colon, unspecified: Secondary | ICD-10-CM | POA: Insufficient documentation

## 2018-01-16 DIAGNOSIS — E785 Hyperlipidemia, unspecified: Secondary | ICD-10-CM

## 2018-01-16 DIAGNOSIS — Z1231 Encounter for screening mammogram for malignant neoplasm of breast: Secondary | ICD-10-CM

## 2018-01-16 DIAGNOSIS — R7303 Prediabetes: Secondary | ICD-10-CM | POA: Diagnosis not present

## 2018-01-16 DIAGNOSIS — E6609 Other obesity due to excess calories: Secondary | ICD-10-CM

## 2018-01-16 DIAGNOSIS — Z124 Encounter for screening for malignant neoplasm of cervix: Secondary | ICD-10-CM | POA: Insufficient documentation

## 2018-01-16 DIAGNOSIS — Z6835 Body mass index (BMI) 35.0-35.9, adult: Secondary | ICD-10-CM | POA: Diagnosis not present

## 2018-01-16 DIAGNOSIS — R319 Hematuria, unspecified: Secondary | ICD-10-CM | POA: Diagnosis not present

## 2018-01-16 DIAGNOSIS — E66812 Other obesity due to excess calories: Secondary | ICD-10-CM

## 2018-01-16 DIAGNOSIS — Z1211 Encounter for screening for malignant neoplasm of colon: Secondary | ICD-10-CM | POA: Diagnosis not present

## 2018-01-16 DIAGNOSIS — R69 Illness, unspecified: Secondary | ICD-10-CM | POA: Diagnosis not present

## 2018-01-16 MED ORDER — LOSARTAN POTASSIUM-HCTZ 100-25 MG PO TABS: 1.0000 | ORAL_TABLET | Freq: Every day | ORAL | 1 refills | Status: DC

## 2018-01-16 MED ORDER — TRIAMCINOLONE ACETONIDE 0.1 % EX CREA: 1.0000 "application " | TOPICAL_CREAM | Freq: Two times a day (BID) | CUTANEOUS | 0 refills | Status: DC

## 2018-01-16 MED ORDER — ATORVASTATIN CALCIUM 10 MG PO TABS: 10.0000 mg | ORAL_TABLET | Freq: Every day | ORAL | 3 refills | Status: DC

## 2018-01-16 NOTE — Progress Notes (Signed)
Pre visit review using our clinic review tool, if applicable. No additional management support is needed unless otherwise documented below in the visit note. 

## 2018-01-16 NOTE — Patient Instructions (Addendum)
Please make sure you are taking vitamin D3 at least 1000  IU  Please f/u in 4-6 months sooner if needed   Seborrheic Keratosis Seborrheic keratosis is a common, noncancerous (benign) skin growth. This condition causes waxy, rough, tan, brown, or black spots to appear on the skin. These skin growths can be flat or raised. What are the causes? The cause of this condition is not known. What increases the risk? This condition is more likely to develop in:  People who have a family history of seborrheic keratosis.  People who are 84 or older.  People who are pregnant.  People who have had estrogen replacement therapy.  What are the signs or symptoms? This condition often occurs on the face, chest, shoulders, back, or other areas. These growths:  Are usually painless, but may become irritated and itchy.  Can be yellow, brown, black, or other colors.  Are slightly raised or have a flat surface.  Are sometimes rough or wart-like in texture.  Are often waxy on the surface.  Are round or oval-shaped.  Sometimes look like they are "stuck on."  Often occur in groups, but may occur as a single growth.  How is this diagnosed? This condition is diagnosed with a medical history and physical exam. A sample of the growth may be tested (skin biopsy). You may need to see a skin specialist (dermatologist). How is this treated? Treatment is not usually needed for this condition, unless the growths are irritated or are often bleeding. You may also choose to have the growths removed if you do not like their appearance. Most commonly, these growths are treated with a procedure in which liquid nitrogen is applied to "freeze" off the growth (cryosurgery). They may also be burned off with electricity or cut off. Follow these instructions at home:  Watch your growth for any changes.  Keep all follow-up visits as told by your health care provider. This is important.  Do not scratch or pick at the  growth or growths. This can cause them to become irritated or infected. Contact a health care provider if:  You suddenly have many new growths.  Your growth bleeds, itches, or hurts.  Your growth suddenly becomes larger or changes color. This information is not intended to replace advice given to you by your health care provider. Make sure you discuss any questions you have with your health care provider. Document Released: 11/02/2010 Document Revised: 03/07/2016 Document Reviewed: 02/15/2015 Elsevier Interactive Patient Education  2018 [employer].

## 2018-01-20 ENCOUNTER — Encounter: Payer: Self-pay | Admitting: Internal Medicine

## 2018-01-20 LAB — CYTOLOGY - PAP
BACTERIAL VAGINITIS: NEGATIVE
Candida vaginitis: NEGATIVE
DIAGNOSIS: NEGATIVE
HPV: NOT DETECTED

## 2018-01-20 NOTE — Progress Notes (Signed)
Chief Complaint  Patient presents with  . Follow-up   F/u  1. HTN controlled on hyzaar 100/25 mg qd. reviiewed labs +HLD uncontrolled not on cholesterol medication agreeable to start. Cholesterol been elevated x years w/o medications 2. She enrolled in prediabetes 16 week class to try to lose wt and wants to hold on wt loss medications like adipex/saxenda 3. Will do pap today  4. C/o dry/scaly area to left elbow nothing tried no symptoms   Review of Systems  Constitutional: Negative for weight loss.  HENT: Negative for hearing loss.   Eyes: Negative for blurred vision.  Respiratory: Negative for shortness of breath.   Cardiovascular: Negative for chest pain.  Gastrointestinal: Negative for abdominal pain.  Musculoskeletal: Negative for falls.  Skin: Positive for rash.  Neurological: Negative for headaches.  Psychiatric/Behavioral: Negative for depression.   Past Medical History:  Diagnosis Date  . Allergy   . Hypertension   . Menopausal state    Past Surgical History:  Procedure Laterality Date  . arm surgery Left    plate and screw  . [place]  [place]   [place]  . DILATION AND CURETTAGE OF UTERUS    . polypectomy     Family History  Problem Relation Age of Onset  . Hypertension Mother   . Hyperlipidemia Mother   . Congestive Heart Failure Mother   . Stroke Father   . Diabetes Father   . Hyperlipidemia Father   . Hypertension Father   . Hypertension Sister   . Hyperlipidemia Sister   . Diabetes Sister   . Kidney disease Sister        on HD  . Kidney disease Brother        kidney transplant   . Hypertension Brother   . Hyperlipidemia Brother   . Diabetes Brother   . Colon cancer Neg Hx   . Stomach cancer Neg Hx    Social History   Socioeconomic History  . Marital status: Married    Spouse name: Not on file  . Number of children: Not on file  . Years of education: Not on file  . Highest education level: Not on file  Occupational History  .  Occupation: retired  Scientific laboratory technician  . Financial resource strain: Not on file  . Food insecurity:    Worry: Not on file    Inability: Not on file  . Transportation needs:    Medical: Not on file    Non-medical: Not on file  Tobacco Use  . Smoking status: Never Smoker  . Smokeless tobacco: Never Used  Substance and Sexual Activity  . Alcohol use: Yes    Comment: wine occasional  . Drug use: No  . Sexual activity: Yes    Partners: Male  Lifestyle  . Physical activity:    Days per week: Not on file    Minutes per session: Not on file  . Stress: Not on file  Relationships  . Social connections:    Talks on phone: Not on file    Gets together: Not on file    Attends religious service: Not on file    Active member of club or organization: Not on file    Attends meetings of clubs or organizations: Not on file    Relationship status: Not on file  . Intimate partner violence:    Fear of current or ex partner: Not on file    Emotionally abused: Not on file    Physically abused: Not on  file    Forced sexual activity: Not on file  Other Topics Concern  . Not on file  Social History Narrative   Retired [employer]    Married    From [place]    Likes to travel    Current Meds  Medication Sig  . aspirin 81 MG tablet Take 81 mg by mouth 3 (three) times a week.  . fluticasone (FLONASE) 50 MCG/ACT nasal spray One spray in each nostril twice a day, use left hand for right nostril, and right hand for left nostril.  Marland Kitchen loratadine (CLARITIN) 10 MG tablet Take 1 tablet (10 mg total) by mouth daily.  Marland Kitchen losartan-hydrochlorothiazide (HYZAAR) 100-25 MG tablet Take 1 tablet by mouth daily.  . montelukast (SINGULAIR) 10 MG tablet Take 1 tablet (10 mg total) by mouth at bedtime.  . [DISCONTINUED] losartan-hydrochlorothiazide (HYZAAR) 100-25 MG tablet TAKE 1 TABLET BY MOUTH EVERY DAY   Allergies  Allergen Reactions  . Hydrocodone Swelling   Recent Results (from the past 2160 hour(s))    Vitamin D (25 hydroxy)     Status: None   Collection Time: 12/17/17  8:35 AM  Result Value Ref Range   VITD 30.01 30.00 - 100.00 ng/mL  Iron, TIBC and Ferritin Panel     Status: None   Collection Time: 12/17/17  8:35 AM  Result Value Ref Range   Iron 70 45 - 160 mcg/dL   TIBC 286 250 - 450 mcg/dL (calc)   %SAT 24 11 - 50 % (calc)   Ferritin 56 20 - 288 ng/mL  Urinalysis, Routine w reflex microscopic     Status: Abnormal   Collection Time: 12/17/17  8:35 AM  Result Value Ref Range   Color, Urine YELLOW Yellow;Lt. Yellow   APPearance CLEAR Clear   Specific Gravity, Urine 1.025 1.000 - 1.030   pH 5.5 5.0 - 8.0   Total Protein, Urine NEGATIVE Negative   Urine Glucose NEGATIVE Negative   Ketones, ur NEGATIVE Negative   Bilirubin Urine NEGATIVE Negative   Hgb urine dipstick SMALL (A) Negative   Urobilinogen, UA 0.2 0.0 - 1.0   Leukocytes, UA NEGATIVE Negative   Nitrite NEGATIVE Negative   WBC, UA 0-2/hpf 0-2/hpf   RBC / HPF 3-6/hpf (A) 0-2/hpf   Squamous Epithelial / LPF Rare(0-4/hpf) Rare(0-4/hpf)   Bacteria, UA Rare(<10/hpf) (A) None  TSH     Status: None   Collection Time: 12/17/17  8:35 AM  Result Value Ref Range   TSH 1.56 0.35 - 4.50 uIU/mL  Hemoglobin A1c     Status: None   Collection Time: 12/17/17  8:35 AM  Result Value Ref Range   Hgb A1c MFr Bld 6.4 4.6 - 6.5 %    Comment: Glycemic Control Guidelines for People with Diabetes:Non Diabetic:  <6%Goal of Therapy: <7%Additional Action Suggested:  >8%   Lipid panel     Status: Abnormal   Collection Time: 12/17/17  8:35 AM  Result Value Ref Range   Cholesterol 191 0 - 200 mg/dL    Comment: ATP III Classification       Desirable:  < 200 mg/dL               Borderline High:  200 - 239 mg/dL          High:  > = 240 mg/dL   Triglycerides 75.0 0.0 - 149.0 mg/dL    Comment: Normal:  <150 mg/dLBorderline High:  150 - 199 mg/dL   HDL 56.60 >39.00 mg/dL  VLDL 15.0 0.0 - 40.0 mg/dL   LDL Cholesterol 120 (H) 0 - 99 mg/dL    Total CHOL/HDL Ratio 3     Comment:                Men          Women1/2 Average Risk     3.4          3.3Average Risk          5.0          4.42X Average Risk          9.6          7.13X Average Risk          15.0          11.0                       NonHDL 134.78     Comment: NOTE:  Non-HDL goal should be 30 mg/dL higher than patient's LDL goal (i.e. LDL goal of < 70 mg/dL, would have non-HDL goal of < 100 mg/dL)  CBC with Differential/Platelet     Status: Abnormal   Collection Time: 12/17/17  8:35 AM  Result Value Ref Range   WBC 6.7 4.0 - 10.5 K/uL   RBC 4.01 3.87 - 5.11 Mil/uL   Hemoglobin 11.5 (L) 12.0 - 15.0 g/dL   HCT 34.8 (L) 36.0 - 46.0 %   MCV 86.9 78.0 - 100.0 fl   MCHC 33.2 30.0 - 36.0 g/dL   RDW 14.2 11.5 - 15.5 %   Platelets 230.0 150.0 - 400.0 K/uL   Neutrophils Relative % 55.5 43.0 - 77.0 %   Lymphocytes Relative 32.1 12.0 - 46.0 %   Monocytes Relative 9.4 3.0 - 12.0 %   Eosinophils Relative 2.4 0.0 - 5.0 %   Basophils Relative 0.6 0.0 - 3.0 %   Neutro Abs 3.7 1.4 - 7.7 K/uL   Lymphs Abs 2.1 0.7 - 4.0 K/uL   Monocytes Absolute 0.6 0.1 - 1.0 K/uL   Eosinophils Absolute 0.2 0.0 - 0.7 K/uL   Basophils Absolute 0.0 0.0 - 0.1 K/uL  Comprehensive metabolic panel     Status: None   Collection Time: 12/17/17  8:35 AM  Result Value Ref Range   Sodium 139 135 - 145 mEq/L   Potassium 3.5 3.5 - 5.1 mEq/L   Chloride 103 96 - 112 mEq/L   CO2 29 19 - 32 mEq/L   Glucose, Bld 99 70 - 99 mg/dL   BUN 22 6 - 23 mg/dL   Creatinine, Ser 0.92 0.40 - 1.20 mg/dL   Total Bilirubin 0.6 0.2 - 1.2 mg/dL   Alkaline Phosphatase 108 39 - 117 U/L   AST 15 0 - 37 U/L   ALT 19 0 - 35 U/L   Total Protein 7.4 6.0 - 8.3 g/dL   Albumin 3.8 3.5 - 5.2 g/dL   Calcium 9.5 8.4 - 10.5 mg/dL   GFR 64.98 >60.00 mL/min  Cytology - PAP     Status: None   Collection Time: 01/16/18 12:00 AM  Result Value Ref Range   Adequacy      Satisfactory for evaluation  endocervical/transformation zone component  PRESENT.   Diagnosis      NEGATIVE FOR INTRAEPITHELIAL LESIONS OR MALIGNANCY.   Bacterial vaginitis Negative for Bacterial Vaginitis Microorganisms     Comment: Normal Reference Range - Negative   Candida vaginitis Negative for Candida species  Comment: Normal Reference Range - Negative   HPV NOT DETECTED     Comment: Normal Reference Range - NOT Detected   Material Submitted CervicoVaginal Pap [ThinPrep Imaged]    Objective  Body mass index is 35.35 kg/m. Wt Readings from Last 3 Encounters:  01/16/18 181 lb (82.1 kg)  12/11/17 181 lb 9.6 oz (82.4 kg)  05/16/17 180 lb 2 oz (81.7 kg)   Temp Readings from Last 3 Encounters:  01/16/18 98.3 F (36.8 C) (Oral)  12/11/17 98.3 F (36.8 C) (Oral)  05/16/17 98.3 F (36.8 C) (Oral)   BP Readings from Last 3 Encounters:  01/16/18 110/78  12/11/17 100/64  05/16/17 110/68   Pulse Readings from Last 3 Encounters:  01/16/18 79  12/11/17 80  05/16/17 98    Physical Exam  Constitutional: She is oriented to person, place, and time. Vital signs are normal. She appears well-developed and well-nourished.  HENT:  Head: Normocephalic and atraumatic.  Mouth/Throat: Oropharynx is clear and moist and mucous membranes are normal.  Eyes: Pupils are equal, round, and reactive to light. Conjunctivae are normal.  Cardiovascular: Normal rate, regular rhythm and normal heart sounds.  Pulmonary/Chest: Effort normal and breath sounds normal. Right breast exhibits inverted nipple. Right breast exhibits no mass, no nipple discharge, no skin change and no tenderness. Left breast exhibits inverted nipple. Left breast exhibits no mass, no nipple discharge, no skin change and no tenderness. No breast swelling, tenderness, discharge or bleeding. Breasts are symmetrical.  Chronic b/l inverted nipples   Abdominal: Soft. Bowel sounds are normal. There is no tenderness.  Genitourinary: Uterus normal. Pelvic exam was performed with patient supine. There is no  rash or lesion on the right labia. There is no rash or lesion on the left labia. Cervix exhibits discharge. Cervix exhibits no motion tenderness and no friability. Right adnexum displays no mass, no tenderness and no fullness. Left adnexum displays no mass, no tenderness and no fullness. No erythema, tenderness or bleeding in the vagina. No foreign body in the vagina. No signs of injury around the vagina. No vaginal discharge found.  Genitourinary Comments: White yellow discharge from cervix   Neurological: She is alert and oriented to person, place, and time. Gait normal.  Skin: Skin is warm, dry and intact.     Hyperkeratotic lesion to left elbow   Psychiatric: She has a normal mood and affect. Her speech is normal and behavior is normal. Judgment and thought content normal. Cognition and memory are normal.  Nursing note and vitals reviewed.   Assessment   1. HTN/HLD  2. Obesity BMI 35.35/preDM 3. Hyperkeratotic skin lesion left elbow  4. HM Plan  1. Cont hyzaar 100-25 mg qd  Add lipitor 10 mg qhs  2. Pending 16 week prediabetes/nutrition class  Already exercises, make healthy diet choices  3. TMC bid cream 4.  Declines flu shot  Tdap had zoster had Given info about shingrix and prevnar and pna 23 prior visit pt wants to hold on vaccine for now   Hep c, hiv neg   Pap today dexa 2014 neg rec D3 1000 IU qd  mammlo due 06/02/18 last 05/2017 neg -referred today pt to sch  Colonoscopy 07/2013 tubular polyp x 1 repeat 07/2018 referred today to Dr. Allen Norris or other Mds in practice pt does not want to go back to [place] where prior colonoscopy done Pt to bring back urine/UA   Provider: Dr. Olivia Mackie McLean-Scocuzza-Internal Medicine

## 2018-01-26 ENCOUNTER — Other Ambulatory Visit: Payer: Medicare HMO

## 2018-01-26 DIAGNOSIS — R319 Hematuria, unspecified: Secondary | ICD-10-CM | POA: Diagnosis not present

## 2018-01-27 LAB — URINALYSIS, ROUTINE W REFLEX MICROSCOPIC
Bilirubin, UA: NEGATIVE
GLUCOSE, UA: NEGATIVE
Ketones, UA: NEGATIVE
Leukocytes, UA: NEGATIVE
Nitrite, UA: NEGATIVE
PH UA: 7 (ref 5.0–7.5)
Protein, UA: NEGATIVE
RBC, UA: NEGATIVE
Specific Gravity, UA: 1.012 (ref 1.005–1.030)
Urobilinogen, Ur: 0.2 mg/dL (ref 0.2–1.0)

## 2018-03-04 ENCOUNTER — Encounter: Payer: Self-pay | Admitting: *Deleted

## 2018-04-24 ENCOUNTER — Ambulatory Visit (INDEPENDENT_AMBULATORY_CARE_PROVIDER_SITE_OTHER): Payer: Medicare HMO

## 2018-04-24 ENCOUNTER — Encounter: Payer: Self-pay | Admitting: Internal Medicine

## 2018-04-24 ENCOUNTER — Ambulatory Visit (INDEPENDENT_AMBULATORY_CARE_PROVIDER_SITE_OTHER): Payer: Medicare HMO | Admitting: Internal Medicine

## 2018-04-24 VITALS — BP 122/78 | HR 83 | Temp 98.2°F | Ht 60.0 in | Wt 181.8 lb

## 2018-04-24 DIAGNOSIS — K148 Other diseases of tongue: Secondary | ICD-10-CM | POA: Diagnosis not present

## 2018-04-24 DIAGNOSIS — L409 Psoriasis, unspecified: Secondary | ICD-10-CM

## 2018-04-24 DIAGNOSIS — M79672 Pain in left foot: Secondary | ICD-10-CM

## 2018-04-24 DIAGNOSIS — B379 Candidiasis, unspecified: Secondary | ICD-10-CM | POA: Diagnosis not present

## 2018-04-24 DIAGNOSIS — I10 Essential (primary) hypertension: Secondary | ICD-10-CM

## 2018-04-24 DIAGNOSIS — M19072 Primary osteoarthritis, left ankle and foot: Secondary | ICD-10-CM | POA: Diagnosis not present

## 2018-04-24 MED ORDER — FLUCONAZOLE 150 MG PO TABS: 150.0000 mg | ORAL_TABLET | Freq: Once | ORAL | 0 refills | Status: AC

## 2018-04-24 MED ORDER — CLOBETASOL PROP EMOLLIENT BASE 0.05 % EX CREA: 1.0000 "application " | TOPICAL_CREAM | Freq: Two times a day (BID) | CUTANEOUS | 0 refills | Status: DC

## 2018-04-24 MED ORDER — CLOBETASOL PROPIONATE 0.05 % EX CREA: 1.0000 "application " | TOPICAL_CREAM | Freq: Two times a day (BID) | CUTANEOUS | 0 refills | Status: DC

## 2018-04-24 NOTE — Progress Notes (Signed)
Chief Complaint  Patient presents with  . Follow-up   F/u  1. HTN pt c/w medication being on recall list called her pharmacy and manufactuer TEVA UA and Lupin not on recall list for hyzaar 100-25  2. C/o right vaginal irritation using olay soap and feeling dry. She had pain with urination but not inside more internal. Area was itchy  3. C/o left heel pain h/o plantar fascitis with steroid shot in the past 4. C/o lesion to her tongue not painful lesion is at tip of tongue has dentist appt in 3 months will show him using chlorhexadine mouthwash  5. Scaly patch to left elbow not using tmc bid but has improved with use area is not itchy or painful   Review of Systems  Constitutional: Negative for weight loss.  HENT: Negative for hearing loss.   Eyes: Negative for blurred vision.  Respiratory: Negative for shortness of breath.   Cardiovascular: Negative for chest pain.  Genitourinary: Positive for dysuria.       No uti sxs   Skin: Positive for rash.  Psychiatric/Behavioral: Negative for depression.   Past Medical History:  Diagnosis Date  . Allergy   . Hypertension   . Menopausal state    Past Surgical History:  Procedure Laterality Date  . arm surgery Left    plate and screw  . COLONOSCOPY  ?   out of state  . DILATION AND CURETTAGE OF UTERUS    . polypectomy     Family History  Problem Relation Age of Onset  . Hypertension Mother   . Hyperlipidemia Mother   . Congestive Heart Failure Mother   . Stroke Father   . Diabetes Father   . Hyperlipidemia Father   . Hypertension Father   . Hypertension Sister   . Hyperlipidemia Sister   . Diabetes Sister   . Kidney disease Sister        on HD  . Kidney disease Brother        kidney transplant   . Hypertension Brother   . Hyperlipidemia Brother   . Diabetes Brother   . Colon cancer Neg Hx   . Stomach cancer Neg Hx    Social History   Socioeconomic History  . Marital status: Married    Spouse name: Not on file  .  Number of children: Not on file  . Years of education: Not on file  . Highest education level: Not on file  Occupational History  . Occupation: retired  Scientific laboratory technician  . Financial resource strain: Not on file  . Food insecurity:    Worry: Not on file    Inability: Not on file  . Transportation needs:    Medical: Not on file    Non-medical: Not on file  Tobacco Use  . Smoking status: Never Smoker  . Smokeless tobacco: Never Used  Substance and Sexual Activity  . Alcohol use: Yes    Comment: wine occasional  . Drug use: No  . Sexual activity: Yes    Partners: Male  Lifestyle  . Physical activity:    Days per week: Not on file    Minutes per session: Not on file  . Stress: Not on file  Relationships  . Social connections:    Talks on phone: Not on file    Gets together: Not on file    Attends religious service: Not on file    Active member of club or organization: Not on file    Attends meetings of  clubs or organizations: Not on file    Relationship status: Not on file  . Intimate partner violence:    Fear of current or ex partner: Not on file    Emotionally abused: Not on file    Physically abused: Not on file    Forced sexual activity: Not on file  Other Topics Concern  . Not on file  Social History Narrative   Retired [employer]    Married    From [place]    Likes to travel    Current Meds  Medication Sig  . aspirin 81 MG tablet Take 81 mg by mouth 3 (three) times a week.  Marland Kitchen atorvastatin (LIPITOR) 10 MG tablet Take 1 tablet (10 mg total) by mouth daily at 6 PM.  . fluticasone (FLONASE) 50 MCG/ACT nasal spray One spray in each nostril twice a day, use left hand for right nostril, and right hand for left nostril.  Marland Kitchen loratadine (CLARITIN) 10 MG tablet Take 1 tablet (10 mg total) by mouth daily.  Marland Kitchen losartan-hydrochlorothiazide (HYZAAR) 100-25 MG tablet Take 1 tablet by mouth daily.  . montelukast (SINGULAIR) 10 MG tablet Take 1 tablet (10 mg total) by mouth at  bedtime.  . triamcinolone cream (KENALOG) 0.1 % Apply 1 application topically 2 (two) times daily. Left elbow   Allergies  Allergen Reactions  . Hydrocodone Swelling   Recent Results (from the past 2160 hour(s))  Urinalysis, Routine w reflex microscopic     Status: None   Collection Time: 01/26/18 12:27 PM  Result Value Ref Range   Specific Gravity, UA 1.012 1.005 - 1.030   pH, UA 7.0 5.0 - 7.5   Color, UA Yellow Yellow   Appearance Ur Clear Clear   Leukocytes, UA Negative Negative   Protein, UA Negative Negative/Trace   Glucose, UA Negative Negative   Ketones, UA Negative Negative   RBC, UA Negative Negative   Bilirubin, UA Negative Negative   Urobilinogen, Ur 0.2 0.2 - 1.0 mg/dL   Nitrite, UA Negative Negative   Microscopic Examination Comment     Comment: Microscopic not indicated and not performed.   Objective  Body mass index is 35.51 kg/m. Wt Readings from Last 3 Encounters:  04/24/18 181 lb 12.8 oz (82.5 kg)  01/16/18 181 lb (82.1 kg)  12/11/17 181 lb 9.6 oz (82.4 kg)   Temp Readings from Last 3 Encounters:  04/24/18 98.2 F (36.8 C) (Oral)  01/16/18 98.3 F (36.8 C) (Oral)  12/11/17 98.3 F (36.8 C) (Oral)   BP Readings from Last 3 Encounters:  04/24/18 122/78  01/16/18 110/78  12/11/17 100/64   Pulse Readings from Last 3 Encounters:  04/24/18 83  01/16/18 79  12/11/17 80    Physical Exam  Constitutional: She is oriented to person, place, and time. Vital signs are normal. She appears well-developed and well-nourished. She is cooperative.  HENT:  Head: Normocephalic and atraumatic.  Mouth/Throat: Oropharynx is clear and moist and mucous membranes are normal.    Eyes: Pupils are equal, round, and reactive to light. Conjunctivae are normal.  Cardiovascular: Normal rate, regular rhythm and normal heart sounds.  Pulmonary/Chest: Effort normal and breath sounds normal.  Genitourinary:    Pelvic exam was performed with patient supine. There is rash  on the right labia.  Neurological: She is alert and oriented to person, place, and time. Gait normal.  Skin: Skin is warm, dry and intact.  Psychiatric: She has a normal mood and affect. Her speech is normal and behavior  is normal. Judgment and thought content normal. Cognition and memory are normal.  Nursing note and vitals reviewed.   Assessment   1. HTN  2. Vaginitis vs irritation vs yeast right inner labia 3. Tongue lesion  4. Left heel pain ? Spur vs plantar fascitis  5. C/w psoriasis patch left elbow  6. HM Plan  1.  Called pharm med not on recall list teva [place] and lupin brand  2. Trial of hc and clotrimazole bid otc  Mild soap dove white or unscented  Diflucan x 1 dose  3. Show dentist in 3 months  4. Xray left heel today  If worsening see podiatry for steroid shot  Supportive care  5. Given clobetasol to try if tmc not helping  6.  Declines flu shot  Tdap had zoster had Given info about shingrix and prevnar and pna 23 prior visit pt wants to hold on vaccine for now   -disc again prevnar and pna 23 today  Hep c, hiv neg   Pap 01/16/18 neg neg HPV dexa 2014 neg rec D3 1000 IU qd she is taking D3 800 in MVT. Of note also on glucosamine chrondroitin 1300 mg qd and calcium 1200 mg qd  mammo sch 06/08/18 10 am  Colonoscopy 07/2013 tubular polyp x 1 repeat 07/2018 referred today to Dr. Allen Norris or other Mds in practice pt does not want to go back to [place] where prior colonoscopy done -Dr. Lynnell Jude office called her but not due until 07/2018     Provider: Dr. Olivia Mackie McLean-Scocuzza-Internal Medicine

## 2018-04-24 NOTE — Patient Instructions (Addendum)
Try stretching, ice, insoles Dr. Cydney Ok  Try replens to restore moisture to your private area this is over the counter  F/uin 4-6 months sooner if needed  Try White or unscented dove bar soap  Use hydrocortisone 1% and Clotrimazole 1% 2x per day outside of the vaginal area  -let me know if area is not better next week    Pneumococcal Polysaccharide Vaccine: What You Need to Know 1. Why get vaccinated? Vaccination can protect older adults (and some children and younger adults) from pneumococcal disease. Pneumococcal disease is caused by bacteria that can spread from person to person through close contact. It can cause ear infections, and it can also lead to more serious infections of the:  Lungs (pneumonia),  Blood (bacteremia), and  Covering of the brain and spinal cord (meningitis). Meningitis can cause deafness and brain damage, and it can be fatal.  Anyone can get pneumococcal disease, but children under 9 years of age, people with certain medical conditions, adults over 37 years of age, and cigarette smokers are at the highest risk. About 18,000 older adults die each year from pneumococcal disease in the [place]. Treatment of pneumococcal infections with penicillin and other drugs used to be more effective. But some strains of the disease have become resistant to these drugs. This makes prevention of the disease, through vaccination, even more important. 2. Pneumococcal polysaccharide vaccine (PPSV23) Pneumococcal polysaccharide vaccine (PPSV23) protects against 23 types of pneumococcal bacteria. It will not prevent all pneumococcal disease. PPSV23 is recommended for:  All adults 30 years of age and older,  Anyone 2 through 66 years of age with certain long-term health problems,  Anyone 2 through 66 years of age with a weakened immune system,  Adults 12 through 66 years of age who smoke cigarettes or have asthma.  Most people need only one dose of PPSV. A second dose is  recommended for certain high-risk groups. People 1 and older should get a dose even if they have gotten one or more doses of the vaccine before they turned 65. Your healthcare provider can give you more information about these recommendations. Most healthy adults develop protection within 2 to 3 weeks of getting the shot. 3. Some people should not get this vaccine  Anyone who has had a life-threatening allergic reaction to PPSV should not get another dose.  Anyone who has a severe allergy to any component of PPSV should not receive it. Tell your provider if you have any severe allergies.  Anyone who is moderately or severely ill when the shot is scheduled may be asked to wait until they recover before getting the vaccine. Someone with a mild illness can usually be vaccinated.  Children less than 35 years of age should not receive this vaccine.  There is no evidence that PPSV is harmful to either a pregnant woman or to her fetus. However, as a precaution, women who need the vaccine should be vaccinated before becoming pregnant, if possible. 4. Risks of a vaccine reaction With any medicine, including vaccines, there is a chance of side effects. These are usually mild and go away on their own, but serious reactions are also possible. About half of people who get PPSV have mild side effects, such as redness or pain where the shot is given, which go away within about two days. Less than 1 out of 100 people develop a fever, muscle aches, or more severe local reactions. Problems that could happen after any vaccine:  People sometimes faint after  a medical procedure, including vaccination. Sitting or lying down for about 15 minutes can help prevent fainting, and injuries caused by a fall. Tell your doctor if you feel dizzy, or have vision changes or ringing in the ears.  Some people get severe pain in the shoulder and have difficulty moving the arm where a shot was given. This happens very  rarely.  Any medication can cause a severe allergic reaction. Such reactions from a vaccine are very rare, estimated at about 1 in a million doses, and would happen within a few minutes to a few hours after the vaccination. As with any medicine, there is a very remote chance of a vaccine causing a serious injury or death. The safety of vaccines is always being monitored. For more information, visit: http://www.aguilar.org/ 5. What if there is a serious reaction? What should I look for? Look for anything that concerns you, such as signs of a severe allergic reaction, very high fever, or unusual behavior. Signs of a severe allergic reaction can include hives, swelling of the face and throat, difficulty breathing, a fast heartbeat, dizziness, and weakness. These would usually start a few minutes to a few hours after the vaccination. What should I do? If you think it is a severe allergic reaction or other emergency that can't wait, call 9-1-1 or get to the nearest hospital. Otherwise, call your doctor. Afterward, the reaction should be reported to the Vaccine Adverse Event Reporting System (VAERS). Your doctor might file this report, or you can do it yourself through the VAERS web site at www.vaers.SamedayNews.es, or by calling 561-232-9001. VAERS does not give medical advice. 6. How can I learn more?  Ask your doctor. He or she can give you the vaccine package insert or suggest other sources of information.  Call your local or state health department.  Contact the Centers for Disease Control and Prevention (CDC): ? Call 250 176 7880 (1-800-CDC-INFO) or ? Visit CDC's website at http://hunter.com/ CDC Pneumococcal Polysaccharide Vaccine VIS (02/04/14) This information is not intended to replace advice given to you by your health care provider. Make sure you discuss any questions you have with your health care provider. Document Released: 07/28/2006 Document Revised: 06/20/2016 Document Reviewed:  06/20/2016 Elsevier Interactive Patient Education  2017 Elsevier Inc.  Pneumococcal Conjugate Vaccine (PCV13) What You Need to Know 1. Why get vaccinated? Vaccination can protect both children and adults from pneumococcal disease. Pneumococcal disease is caused by bacteria that can spread from person to person through close contact. It can cause ear infections, and it can also lead to more serious infections of the:  Lungs (pneumonia),  Blood (bacteremia), and  Covering of the brain and spinal cord (meningitis).  Pneumococcal pneumonia is most common among adults. Pneumococcal meningitis can cause deafness and brain damage, and it kills about 1 child in 10 who get it. Anyone can get pneumococcal disease, but children under 52 years of age and adults 36 years and older, people with certain medical conditions, and cigarette smokers are at the highest risk. Before there was a vaccine, the [place] saw:  more than 700 cases of meningitis,  about 13,000 blood infections,  about 5 million ear infections, and  about 200 deaths  in children under 5 each year from pneumococcal disease. Since vaccine became available, severe pneumococcal disease in these children has fallen by 88%. About 18,000 older adults die of pneumococcal disease each year in the [place]. Treatment of pneumococcal infections with penicillin and other drugs is not as effective  as it used to be, because some strains of the disease have become resistant to these drugs. This makes prevention of the disease, through vaccination, even more important. 2. PCV13 vaccine Pneumococcal conjugate vaccine (called PCV13) protects against 13 types of pneumococcal bacteria. PCV13 is routinely given to children at 2, 4, 6, and 43-42 months of age. It is also recommended for children and adults 36 to 4 years of age with certain health conditions, and for all adults 35 years of age and older. Your doctor can give you details. 3.  Some people should not get this vaccine Anyone who has ever had a life-threatening allergic reaction to a dose of this vaccine, to an earlier pneumococcal vaccine called PCV7, or to any vaccine containing diphtheria toxoid (for example, DTaP), should not get PCV13. Anyone with a severe allergy to any component of PCV13 should not get the vaccine. Tell your doctor if the person being vaccinated has any severe allergies. If the person scheduled for vaccination is not feeling well, your healthcare provider might decide to reschedule the shot on another day. 4. Risks of a vaccine reaction With any medicine, including vaccines, there is a chance of reactions. These are usually mild and go away on their own, but serious reactions are also possible. Problems reported following PCV13 varied by age and dose in the series. The most common problems reported among children were:  About half became drowsy after the shot, had a temporary loss of appetite, or had redness or tenderness where the shot was given.  About 1 out of 3 had swelling where the shot was given.  About 1 out of 3 had a mild fever, and about 1 in 20 had a fever over 102.67F.  Up to about 8 out of 10 became fussy or irritable.  Adults have reported pain, redness, and swelling where the shot was given; also mild fever, fatigue, headache, chills, or muscle pain. Young children who get PCV13 along with inactivated flu vaccine at the same time may be at increased risk for seizures caused by fever. Ask your doctor for more information. Problems that could happen after any vaccine:  People sometimes faint after a medical procedure, including vaccination. Sitting or lying down for about 15 minutes can help prevent fainting, and injuries caused by a fall. Tell your doctor if you feel dizzy, or have vision changes or ringing in the ears.  Some older children and adults get severe pain in the shoulder and have difficulty moving the arm where a shot  was given. This happens very rarely.  Any medication can cause a severe allergic reaction. Such reactions from a vaccine are very rare, estimated at about 1 in a million doses, and would happen within a few minutes to a few hours after the vaccination. As with any medicine, there is a very small chance of a vaccine causing a serious injury or death. The safety of vaccines is always being monitored. For more information, visit: http://www.aguilar.org/ 5. What if there is a serious reaction? What should I look for? Look for anything that concerns you, such as signs of a severe allergic reaction, very high fever, or unusual behavior. Signs of a severe allergic reaction can include hives, swelling of the face and throat, difficulty breathing, a fast heartbeat, dizziness, and weakness-usually within a few minutes to a few hours after the vaccination. What should I do?  If you think it is a severe allergic reaction or other emergency that can't wait, call 9-1-1 or  get the person to the nearest hospital. Otherwise, call your doctor.  Reactions should be reported to the Vaccine Adverse Event Reporting System (VAERS). Your doctor should file this report, or you can do it yourself through the VAERS web site at www.vaers.SamedayNews.es, or by calling [phone]. ? VAERS does not give medical advice. 6. The National Vaccine Injury Compensation Program The [employer] Vaccine Injury Compensation Program (VICP) is a federal program that was created to compensate people who may have been injured by certain vaccines. Persons who believe they may have been injured by a vaccine can learn about the program and about filing a claim by calling 916-370-2354 or visiting the [facility] website at GoldCloset.com.ee. There is a time limit to file a claim for compensation. 7. How can I learn more?  Ask your healthcare provider. He or she can give you the vaccine package insert or suggest other sources of  information.  Call your local or state health department.  Contact the Centers for Disease Control and Prevention (CDC): ? Call 267-357-6622 (1-800-CDC-INFO) or ? Visit CDC's website at http://hunter.com/ Vaccine Information Statement, PCV13 Vaccine (08/18/2014) This information is not intended to replace advice given to you by your health care provider. Make sure you discuss any questions you have with your health care provider. Document Released: 07/28/2006 Document Revised: 06/20/2016 Document Reviewed: 06/20/2016 Elsevier Interactive Patient Education  2017 Elsevier Inc.   Plantar Fasciitis Plantar fasciitis is a painful foot condition that affects the heel. It occurs when the band of tissue that connects the toes to the heel bone (plantar fascia) becomes irritated. This can happen after exercising too much or doing other repetitive activities (overuse injury). The pain from plantar fasciitis can range from mild irritation to severe pain that makes it difficult for you to walk or move. The pain is usually worse in the morning or after you have been sitting or lying down for a while. What are the causes? This condition may be caused by:  Standing for long periods of time.  Wearing shoes that do not fit.  Doing high-impact activities, including running, aerobics, and ballet.  Being overweight.  Having an abnormal way of walking (gait).  Having tight calf muscles.  Having high arches in your feet.  Starting a new athletic activity.  What are the signs or symptoms? The main symptom of this condition is heel pain. Other symptoms include:  Pain that gets worse after activity or exercise.  Pain that is worse in the morning or after resting.  Pain that goes away after you walk for a few minutes.  How is this diagnosed? This condition may be diagnosed based on your signs and symptoms. Your health care provider will also do a physical exam to check for:  A tender area on  the bottom of your foot.  A high arch in your foot.  Pain when you move your foot.  Difficulty moving your foot.  You may also need to have imaging studies to confirm the diagnosis. These can include:  X-rays.  Ultrasound.  MRI.  How is this treated? Treatment for plantar fasciitis depends on the severity of the condition. Your treatment may include:  Rest, ice, and over-the-counter pain medicines to manage your pain.  Exercises to stretch your calves and your plantar fascia.  A splint that holds your foot in a stretched, upward position while you sleep (night splint).  Physical therapy to relieve symptoms and prevent problems in the future.  Cortisone injections to relieve severe pain.  Extracorporeal shock wave therapy (ESWT) to stimulate damaged plantar fascia with electrical impulses. It is often used as a last resort before surgery.  Surgery, if other treatments have not worked after 12 months.  Follow these instructions at home:  Take medicines only as directed by your health care provider.  Avoid activities that cause pain.  Roll the bottom of your foot over a bag of ice or a bottle of cold water. Do this for 20 minutes, 3-4 times a day.  Perform simple stretches as directed by your health care provider.  Try wearing athletic shoes with air-sole or gel-sole cushions or soft shoe inserts.  Wear a night splint while sleeping, if directed by your health care provider.  Keep all follow-up appointments with your health care provider. How is this prevented?  Do not perform exercises or activities that cause heel pain.  Consider finding low-impact activities if you continue to have problems.  Lose weight if you need to. The best way to prevent plantar fasciitis is to avoid the activities that aggravate your plantar fascia. Contact a health care provider if:  Your symptoms do not go away after treatment with home care measures.  Your pain gets worse.  Your  pain affects your ability to move or do your daily activities. This information is not intended to replace advice given to you by your health care provider. Make sure you discuss any questions you have with your health care provider. Document Released: 06/25/2001 Document Revised: 03/04/2016 Document Reviewed: 08/10/2014 Elsevier Interactive Patient Education  [employer].

## 2018-04-24 NOTE — Progress Notes (Signed)
Pre visit review using our clinic review tool, if applicable. No additional management support is needed unless otherwise documented below in the visit note. 

## 2018-06-02 ENCOUNTER — Other Ambulatory Visit: Payer: Self-pay

## 2018-06-02 DIAGNOSIS — L409 Psoriasis, unspecified: Secondary | ICD-10-CM

## 2018-06-02 MED ORDER — CLOBETASOL PROPIONATE 0.05 % EX CREA: 1.0000 "application " | TOPICAL_CREAM | Freq: Two times a day (BID) | CUTANEOUS | 0 refills | Status: DC

## 2018-06-08 ENCOUNTER — Ambulatory Visit
Admission: RE | Admit: 2018-06-08 | Discharge: 2018-06-08 | Disposition: A | Discharge status: Home / Self Care | Payer: Medicare HMO | Source: Ambulatory Visit | Attending: Internal Medicine | Admitting: Internal Medicine

## 2018-06-08 DIAGNOSIS — Z1231 Encounter for screening mammogram for malignant neoplasm of breast: Secondary | ICD-10-CM | POA: Insufficient documentation

## 2018-07-16 ENCOUNTER — Other Ambulatory Visit: Payer: Self-pay | Admitting: Internal Medicine

## 2018-07-16 DIAGNOSIS — I10 Essential (primary) hypertension: Secondary | ICD-10-CM

## 2018-07-16 MED ORDER — LOSARTAN POTASSIUM-HCTZ 100-25 MG PO TABS: 1.0000 | ORAL_TABLET | Freq: Every day | ORAL | 2 refills | Status: DC

## 2018-08-10 ENCOUNTER — Encounter: Payer: Self-pay | Admitting: Gastroenterology

## 2018-09-08 ENCOUNTER — Encounter: Payer: Medicare HMO | Admitting: Internal Medicine

## 2018-09-16 DIAGNOSIS — R69 Illness, unspecified: Secondary | ICD-10-CM | POA: Diagnosis not present

## 2018-09-22 ENCOUNTER — Ambulatory Visit (INDEPENDENT_AMBULATORY_CARE_PROVIDER_SITE_OTHER): Payer: Medicare HMO | Admitting: Internal Medicine

## 2018-09-22 ENCOUNTER — Encounter: Payer: Self-pay | Admitting: Internal Medicine

## 2018-09-22 VITALS — BP 132/86 | HR 76 | Temp 97.6°F | Ht 60.0 in | Wt 179.4 lb

## 2018-09-22 DIAGNOSIS — E559 Vitamin D deficiency, unspecified: Secondary | ICD-10-CM | POA: Diagnosis not present

## 2018-09-22 DIAGNOSIS — Z0184 Encounter for antibody response examination: Secondary | ICD-10-CM

## 2018-09-22 DIAGNOSIS — E7800 Pure hypercholesterolemia, unspecified: Secondary | ICD-10-CM | POA: Diagnosis not present

## 2018-09-22 DIAGNOSIS — R7303 Prediabetes: Secondary | ICD-10-CM

## 2018-09-22 DIAGNOSIS — L309 Dermatitis, unspecified: Secondary | ICD-10-CM | POA: Diagnosis not present

## 2018-09-22 DIAGNOSIS — Z1159 Encounter for screening for other viral diseases: Secondary | ICD-10-CM

## 2018-09-22 DIAGNOSIS — Z0000 Encounter for general adult medical examination without abnormal findings: Secondary | ICD-10-CM | POA: Diagnosis not present

## 2018-09-22 DIAGNOSIS — I10 Essential (primary) hypertension: Secondary | ICD-10-CM

## 2018-09-22 DIAGNOSIS — E669 Obesity, unspecified: Secondary | ICD-10-CM

## 2018-09-22 DIAGNOSIS — Z1329 Encounter for screening for other suspected endocrine disorder: Secondary | ICD-10-CM

## 2018-09-22 DIAGNOSIS — Z1211 Encounter for screening for malignant neoplasm of colon: Secondary | ICD-10-CM | POA: Diagnosis not present

## 2018-09-22 DIAGNOSIS — B354 Tinea corporis: Secondary | ICD-10-CM | POA: Diagnosis not present

## 2018-09-22 DIAGNOSIS — K635 Polyp of colon: Secondary | ICD-10-CM | POA: Diagnosis not present

## 2018-09-22 MED ORDER — MICONAZOLE NITRATE 2 % EX CREA: 1.0000 "application " | TOPICAL_CREAM | Freq: Two times a day (BID) | CUTANEOUS | 2 refills | Status: DC

## 2018-09-22 NOTE — Progress Notes (Addendum)
Chief Complaint  Patient presents with  . Annual Exam   Physical  1. BP controlled on losartan 100 and hctz 25 mg qd HLD she is taking lipitor 10 mg qhs   2. C/o itchy red raised and scaly rash to legs L>R and right upper arm nothing tried she has been getting pedicures  3. H/o colon polyps tubular Dr. Deatra Ina due for repeat colonoscopy wants to go locally    Review of Systems  Constitutional: Positive for weight loss.       Down 2 lbs was down to 174 but wt increased    HENT: Negative for hearing loss.   Eyes: Negative for blurred vision.  Respiratory: Negative for shortness of breath.   Cardiovascular: Negative for chest pain.  Gastrointestinal: Negative for abdominal pain.  Musculoskeletal: Negative for falls.  Skin: Positive for rash.  Neurological: Negative for headaches.  Psychiatric/Behavioral: Negative for depression.   Past Medical History:  Diagnosis Date  . Allergy   . Hypertension   . Menopausal state    Past Surgical History:  Procedure Laterality Date  . arm surgery Left    plate and screw  . [place]OS[place]  ?   [place]  . DILATION AND CURETTAGE OF UTERUS    . polypectomy     Family History  Problem Relation Age of Onset  . Hypertension Mother   . Hyperlipidemia Mother   . Congestive Heart Failure Mother   . Stroke Father   . Diabetes Father   . Hyperlipidemia Father   . Hypertension Father   . Hypertension Sister   . Hyperlipidemia Sister   . Diabetes Sister   . Kidney disease Sister        on HD  . Kidney disease Brother        kidney transplant   . Hypertension Brother   . Hyperlipidemia Brother   . Diabetes Brother   . Colon cancer Neg Hx   . Stomach cancer Neg Hx   . Breast cancer Neg Hx    Social History   Socioeconomic History  . Marital status: Married    Spouse name: Not on file  . Number of children: Not on file  . Years of education: Not on file  . Highest education level: Not on file  Occupational History  .  Occupation: retired  Scientific laboratory technician  . Financial resource strain: Not on file  . Food insecurity:    Worry: Not on file    Inability: Not on file  . Transportation needs:    Medical: Not on file    Non-medical: Not on file  Tobacco Use  . Smoking status: Never Smoker  . Smokeless tobacco: Never Used  Substance and Sexual Activity  . Alcohol use: Yes    Comment: wine occasional  . Drug use: No  . Sexual activity: Yes    Partners: Male  Lifestyle  . Physical activity:    Days per week: Not on file    Minutes per session: Not on file  . Stress: Not on file  Relationships  . Social connections:    Talks on phone: Not on file    Gets together: Not on file    Attends religious service: Not on file    Active member of club or organization: Not on file    Attends meetings of clubs or organizations: Not on file    Relationship status: Not on file  . Intimate partner violence:    Fear of current or  ex partner: Not on file    Emotionally abused: Not on file    Physically abused: Not on file    Forced sexual activity: Not on file  Other Topics Concern  . Not on file  Social History Narrative   Retired [employer]    Married    From [place]    Likes to travel    Current Meds  Medication Sig  . aspirin 81 MG tablet Take 81 mg by mouth 3 (three) times a week.  Marland Kitchen atorvastatin (LIPITOR) 10 MG tablet Take 1 tablet (10 mg total) by mouth daily at 6 PM.  . Calcium Carbonate-Vit D-Min (CALCIUM 1200 PO) Take by mouth daily.  . clobetasol cream (TEMOVATE) 0.27 % Apply 1 application topically 2 (two) times daily. Left elbow  . fluticasone (FLONASE) 50 MCG/ACT nasal spray One spray in each nostril twice a day, use left hand for right nostril, and right hand for left nostril.  . Glucosamine-Chondroitin (GLUCOSAMINE CHONDR COMPLEX PO) Take 1,300 mg by mouth daily.  Marland Kitchen loratadine (CLARITIN) 10 MG tablet Take 1 tablet (10 mg total) by mouth daily.  Marland Kitchen losartan-hydrochlorothiazide (HYZAAR)  100-25 MG tablet Take 1 tablet by mouth daily.  . montelukast (SINGULAIR) 10 MG tablet Take 1 tablet (10 mg total) by mouth at bedtime.  . Multiple Vitamins-Minerals (MULTIVITAMIN ADULT PO) Take by mouth.  . triamcinolone cream (KENALOG) 0.1 % Apply 1 application topically 2 (two) times daily. Left elbow   Allergies  Allergen Reactions  . Hydrocodone Swelling   No results found for this or any previous visit (from the past 2160 hour(s)). Objective  Body mass index is 35.04 kg/m. Wt Readings from Last 3 Encounters:  09/22/18 179 lb 6.4 oz (81.4 kg)  04/24/18 181 lb 12.8 oz (82.5 kg)  01/16/18 181 lb (82.1 kg)   Temp Readings from Last 3 Encounters:  09/22/18 97.6 F (36.4 C) (Oral)  04/24/18 98.2 F (36.8 C) (Oral)  01/16/18 98.3 F (36.8 C) (Oral)   BP Readings from Last 3 Encounters:  09/22/18 132/86  04/24/18 122/78  01/16/18 110/78   Pulse Readings from Last 3 Encounters:  09/22/18 76  04/24/18 83  01/16/18 79    Physical Exam  Constitutional: She is oriented to person, place, and time. Vital signs are normal. She appears well-developed and well-nourished. She is cooperative.  HENT:  Head: Normocephalic and atraumatic.  Mouth/Throat: Oropharynx is clear and moist and mucous membranes are normal.  Eyes: Pupils are equal, round, and reactive to light. Conjunctivae are normal.  Cardiovascular: Normal rate, regular rhythm and normal heart sounds.  Pulmonary/Chest: Effort normal and breath sounds normal. She exhibits no mass and no tenderness. Right breast exhibits no inverted nipple, no mass, no nipple discharge, no skin change and no tenderness. Left breast exhibits no inverted nipple, no mass, no nipple discharge, no skin change and no tenderness. No breast swelling, tenderness, discharge or bleeding. Breasts are symmetrical.  Neurological: She is alert and oriented to person, place, and time. Gait normal.  Skin: Skin is warm and dry. Rash noted.     Psychiatric: She  has a normal mood and affect. Her speech is normal and behavior is normal. Judgment and thought content normal. Cognition and memory are normal.  Nursing note and vitals reviewed.   Assessment   1. annual 2. HTN/HLD  3. C/w tinea legs and right upper arm  Dermatitis vs psoriasis to b/l elbows  Plan   1.  sch labs upcoming  rec healthy  diet choices and exercise  Flu shot had 09/16/18  Tdap had zoster had Given info about shingrix and prevnar and pna 23prior visit pt wants to hold on vaccine for now -disc again prevnar and pna 23 in futuer  Hep c, hiv neg   Pap 01/16/18 neg neg HPV dexa 2014 negrec D3 1000 IU qdshe is taking D3 800 in MVT. Of note also on glucosamine chrondroitin 1300 mg qd and calcium 1200 mg qd  mammo sch 06/08/18 neg  Colonoscopy 07/2013 tubular polyp x 1 repeat 10/2019referred today to Dr. Allen Norris or other [facility] in practice pt does not want to go back to [place] where prior colonoscopy done -referred Dr. Vicente Males locally  2. Cont meds  3. Miconazole legs and right upper arm  Cont clobetasol to elbows b/l   [facility] 10/26/2018 eye exam cataract stable OU suspect glaucoma f/u in 2-3 months    Provider: Dr. Olivia Mackie McLean-Scocuzza-Internal Medicine

## 2018-09-22 NOTE — Progress Notes (Signed)
Pre visit review using our clinic review tool, if applicable. No additional management support is needed unless otherwise documented below in the visit note. 

## 2018-09-22 NOTE — Patient Instructions (Addendum)
clobetasol 2x per day to elbows   Miconazole cream antifungal to legs and right upper arm    Body Ringworm Body ringworm is an infection of the skin that often causes a ring-shaped rash. Body ringworm can affect any part of your skin. It can spread easily to others. Body ringworm is also called tinea corporis. What are the causes? This condition is caused by funguses called dermatophytes. The condition develops when these funguses grow out of control on the skin. You can get this condition if you touch a person or animal that has it. You can also get it if you share clothing, bedding, towels, or any other object with an infected person or pet. What increases the risk? This condition is more likely to develop in:  Athletes who often make skin-to-skin contact with other athletes, such as wrestlers.  People who share equipment and mats.  People with a weakened immune system.  What are the signs or symptoms? Symptoms of this condition include:  Itchy, raised red spots and bumps.  Red scaly patches.  A ring-shaped rash. The rash may have: ? A clear center. ? Scales or red bumps at its center. ? Redness near its borders. ? Dry and scaly skin on or around it.  How is this diagnosed? This condition can usually be diagnosed with a skin exam. A skin scraping may be taken from the affected area and examined under a microscope to see if the fungus is present. How is this treated? This condition may be treated with:  An antifungal cream or ointment.  An antifungal shampoo.  Antifungal medicines. These may be prescribed if your ringworm is severe, keeps coming back, or lasts a long time.  Follow these instructions at home:  Take over-the-counter and prescription medicines only as told by your health care provider.  If you were given an antifungal cream or ointment: ? Use it as told by your health care provider. ? Wash the infected area and dry it completely before applying the  cream or ointment.  If you were given an antifungal shampoo: ? Use it as told by your health care provider. ? Leave the shampoo on your body for 3-5 minutes before rinsing.  While you have a rash: ? Wear loose clothing to stop clothes from rubbing and irritating it. ? Wash or change your bed sheets every night.  If your pet has the same infection, take your pet to see a Animal nutritionist. How is this prevented?  Practice good hygiene.  Wear sandals or shoes in public places and showers.  Do not share personal items with others.  Avoid touching red patches of skin on other people.  Avoid touching pets that have bald spots.  If you touch an animal that has a bald spot, wash your hands. Contact a health care provider if:  Your rash continues to spread after 7 days of treatment.  Your rash is not gone in 4 weeks.  The area around your rash gets red, warm, tender, and swollen. This information is not intended to replace advice given to you by your health care provider. Make sure you discuss any questions you have with your health care provider. Document Released: 09/27/2000 Document Revised: 03/07/2016 Document Reviewed: 07/27/2015 Elsevier Interactive Patient Education  [employer].

## 2018-09-23 NOTE — Addendum Note (Signed)
Addended by: Orland Mustard on: 09/23/2018 12:52 PM   Modules accepted: Level of Service

## 2018-10-26 DIAGNOSIS — H40023 Open angle with borderline findings, high risk, bilateral: Secondary | ICD-10-CM | POA: Diagnosis not present

## 2018-10-26 DIAGNOSIS — H524 Presbyopia: Secondary | ICD-10-CM | POA: Diagnosis not present

## 2018-10-26 DIAGNOSIS — H2513 Age-related nuclear cataract, bilateral: Secondary | ICD-10-CM | POA: Diagnosis not present

## 2018-10-26 DIAGNOSIS — H52223 Regular astigmatism, bilateral: Secondary | ICD-10-CM | POA: Diagnosis not present

## 2018-10-26 DIAGNOSIS — H5213 Myopia, bilateral: Secondary | ICD-10-CM | POA: Diagnosis not present

## 2018-11-02 ENCOUNTER — Encounter: Payer: Self-pay | Admitting: *Deleted

## 2018-12-18 ENCOUNTER — Ambulatory Visit (INDEPENDENT_AMBULATORY_CARE_PROVIDER_SITE_OTHER): Payer: Medicare HMO | Admitting: Internal Medicine

## 2018-12-18 ENCOUNTER — Encounter: Payer: Self-pay | Admitting: Internal Medicine

## 2018-12-18 VITALS — BP 102/72 | HR 91 | Temp 97.8°F | Ht 60.0 in | Wt 180.6 lb

## 2018-12-18 DIAGNOSIS — Z6835 Body mass index (BMI) 35.0-35.9, adult: Secondary | ICD-10-CM | POA: Diagnosis not present

## 2018-12-18 DIAGNOSIS — I10 Essential (primary) hypertension: Secondary | ICD-10-CM

## 2018-12-18 DIAGNOSIS — Z1231 Encounter for screening mammogram for malignant neoplasm of breast: Secondary | ICD-10-CM

## 2018-12-18 DIAGNOSIS — E6609 Other obesity due to excess calories: Secondary | ICD-10-CM | POA: Diagnosis not present

## 2018-12-18 DIAGNOSIS — N898 Other specified noninflammatory disorders of vagina: Secondary | ICD-10-CM | POA: Diagnosis not present

## 2018-12-18 DIAGNOSIS — R21 Rash and other nonspecific skin eruption: Secondary | ICD-10-CM | POA: Diagnosis not present

## 2018-12-18 MED ORDER — FLUCONAZOLE 150 MG PO TABS: 150.0000 mg | ORAL_TABLET | Freq: Once | ORAL | 0 refills | Status: AC

## 2018-12-18 MED ORDER — ESTRADIOL 0.1 MG/GM VA CREA: 1.0000 | TOPICAL_CREAM | VAGINAL | 12 refills | Status: DC

## 2018-12-18 NOTE — Patient Instructions (Signed)
Dermatology [phone]  Dr. Kellie Moor  [place] due 05/2019   Atrophic Vaginitis  Atrophic vaginitis is a condition in which the tissues that line the vagina become dry and thin. This condition is most common in women who have stopped having regular menstrual periods (are in menopause). This usually starts when a woman is 47-67 years old. That is the time when a woman's estrogen levels begin to drop (decrease). Estrogen is a female hormone. It helps to keep the tissues of the vagina moist. It stimulates the vagina to produce a clear fluid that lubricates the vagina for sexual intercourse. This fluid also protects the vagina from infection. Lack of estrogen can cause the lining of the vagina to get thinner and dryer. The vagina may also shrink in size. It may become less elastic. Atrophic vaginitis tends to get worse over time as a woman's estrogen level drops. What are the causes? This condition is caused by the normal drop in estrogen that happens around the time of menopause. What increases the risk? Certain conditions or situations may lower a woman's estrogen level, leading to a higher risk for atrophic vaginitis. You are more likely to develop this condition if:  You are taking medicines that block estrogen.  You have had your ovaries removed.  You are being treated for cancer with X-ray (radiation) or medicines (chemotherapy).  You have given birth or are breastfeeding.  You are older than age 38.  You smoke. What are the signs or symptoms? Symptoms of this condition include:  Pain, soreness, or bleeding during sexual intercourse (dyspareunia).  Vaginal burning, irritation, or itching.  Pain or bleeding when a speculum is used in a vaginal exam (pelvic exam).  Having burning pain when passing urine.  Vaginal discharge that is brown or yellow. In some cases, there are no symptoms. How is this diagnosed? This condition is diagnosed by taking a medical  history and doing a physical exam. This will include a pelvic exam that checks the vaginal tissues. Though rare, you may also have other tests, including:  A urine test.  A test that checks the acid balance in your vagina (acid balance test). How is this treated? Treatment for this condition depends on how severe your symptoms are. Treatment may include:  Using an over-the-counter vaginal lubricant before sex.  Using a long-acting vaginal moisturizer.  Using low-dose vaginal estrogen for moderate to severe symptoms that do not respond to other treatments. Options include creams, tablets, and inserts (vaginal rings). Before you use a vaginal estrogen, tell your health care provider if you have a history of: ? Breast cancer. ? Endometrial cancer. ? Blood clots. If you are not sexually active and your symptoms are very mild, you may not need treatment. Follow these instructions at home: Medicines  Take over-the-counter and prescription medicines only as told by your health care provider. Do not use herbal or alternative medicines unless your health care provider says that you can.  Use over-the-counter creams, lubricants, or moisturizers for dryness only as directed by your health care provider. General instructions  If your atrophic vaginitis is caused by menopause, discuss all of your menopause symptoms and treatment options with your health care provider.  Do not douche.  Do not use products that can make your vagina dry. These include: ? Scented feminine sprays. ? Scented tampons. ? Scented soaps.  Vaginal intercourse can help to improve blood flow and elasticity of vaginal tissue. If it hurts to have  sex, try using a lubricant or moisturizer just before having intercourse. Contact a health care provider if:  Your discharge looks different than normal.  Your vagina has an unusual smell.  You have new symptoms.  Your symptoms do not improve with treatment.  Your symptoms  get worse. Summary  Atrophic vaginitis is a condition in which the tissues that line the vagina become dry and thin. It is most common in women who have stopped having regular menstrual periods (are in menopause).  Treatment options include using vaginal lubricants and low-dose vaginal estrogen.  Contact a health care provider if your vagina has an unusual smell, or if your symptoms get worse or do not improve after treatment. This information is not intended to replace advice given to you by your health care provider. Make sure you discuss any questions you have with your health care provider. Document Released: 02/14/2015 Document Revised: 06/26/2017 Document Reviewed: 06/26/2017 Elsevier Interactive Patient Education  2019 [employer].

## 2018-12-18 NOTE — Progress Notes (Signed)
Pre visit review using our clinic review tool, if applicable. No additional management support is needed unless otherwise documented below in the visit note. 

## 2018-12-18 NOTE — Progress Notes (Addendum)
Chief Complaint  Patient presents with  . Rash   F/u  1. HTN controlled on hyzaar 100/25 mg qd  2. C/o still rash since 09/2018 on arms and legs red, scaly using micatin 2% cream bid and not helping. Rash is itchy at times. She also has rougher thicker rash to b/l elbows which is better with temovate 3. C/o vaginal itching w/o discharge and vaginal dryness   Review of Systems  Constitutional: Negative for weight loss.  HENT: Negative for hearing loss.   Eyes: Negative for blurred vision.  Respiratory: Negative for shortness of breath.   Cardiovascular: Negative for chest pain.  Gastrointestinal: Negative for abdominal pain.  Skin: Positive for itching and rash.  Neurological: Negative for headaches.  Psychiatric/Behavioral: Negative for depression and memory loss.   Past Medical History:  Diagnosis Date  . Allergy   . Hypertension   . Menopausal state    Past Surgical History:  Procedure Laterality Date  . arm surgery Left    plate and screw  . [place]OS[place]  ?   [place]  . DILATION AND CURETTAGE OF UTERUS    . polypectomy     Family History  Problem Relation Age of Onset  . Hypertension Mother   . Hyperlipidemia Mother   . Congestive Heart Failure Mother   . Stroke Father   . Diabetes Father   . Hyperlipidemia Father   . Hypertension Father   . Hypertension Sister   . Hyperlipidemia Sister   . Diabetes Sister   . Kidney disease Sister        on HD  . Kidney disease Brother        kidney transplant   . Hypertension Brother   . Hyperlipidemia Brother   . Diabetes Brother   . Colon cancer Neg Hx   . Stomach cancer Neg Hx   . Breast cancer Neg Hx    Social History   Socioeconomic History  . Marital status: Married    Spouse name: Not on file  . Number of children: Not on file  . Years of education: Not on file  . Highest education level: Not on file  Occupational History  . Occupation: retired  Scientific laboratory technician  . Financial resource strain: Not on file   . Food insecurity:    Worry: Not on file    Inability: Not on file  . Transportation needs:    Medical: Not on file    Non-medical: Not on file  Tobacco Use  . Smoking status: Never Smoker  . Smokeless tobacco: Never Used  Substance and Sexual Activity  . Alcohol use: Yes    Comment: wine occasional  . Drug use: No  . Sexual activity: Yes    Partners: Male  Lifestyle  . Physical activity:    Days per week: Not on file    Minutes per session: Not on file  . Stress: Not on file  Relationships  . Social connections:    Talks on phone: Not on file    Gets together: Not on file    Attends religious service: Not on file    Active member of club or organization: Not on file    Attends meetings of clubs or organizations: Not on file    Relationship status: Not on file  . Intimate partner violence:    Fear of current or ex partner: Not on file    Emotionally abused: Not on file    Physically abused: Not on file  Forced sexual activity: Not on file  Other Topics Concern  . Not on file  Social History Narrative   Retired [employer]    Married    From [place]    Likes to travel    Current Meds  Medication Sig  . aspirin 81 MG tablet Take 81 mg by mouth 3 (three) times a week.  Marland Kitchen atorvastatin (LIPITOR) 10 MG tablet Take 1 tablet (10 mg total) by mouth daily at 6 PM.  . Calcium Carbonate-Vit D-Min (CALCIUM 1200 PO) Take by mouth daily.  . clobetasol cream (TEMOVATE) 6.14 % Apply 1 application topically 2 (two) times daily. Left elbow  . fluticasone (FLONASE) 50 MCG/ACT nasal spray One spray in each nostril twice a day, use left hand for right nostril, and right hand for left nostril.  . Glucosamine-Chondroitin (GLUCOSAMINE CHONDR COMPLEX PO) Take 1,300 mg by mouth daily.  Marland Kitchen loratadine (CLARITIN) 10 MG tablet Take 1 tablet (10 mg total) by mouth daily.  Marland Kitchen losartan-hydrochlorothiazide (HYZAAR) 100-25 MG tablet Take 1 tablet by mouth daily.  . miconazole (MICATIN) 2 % cream  Apply 1 application topically 2 (two) times daily. To legs and upper arm  . montelukast (SINGULAIR) 10 MG tablet Take 1 tablet (10 mg total) by mouth at bedtime.  . Multiple Vitamins-Minerals (MULTIVITAMIN ADULT PO) Take by mouth.   Allergies  Allergen Reactions  . Hydrocodone Swelling   No results found for this or any previous visit (from the past 2160 hour(s)). Objective  Body mass index is 35.27 kg/m. Wt Readings from Last 3 Encounters:  12/18/18 180 lb 9.6 oz (81.9 kg)  09/22/18 179 lb 6.4 oz (81.4 kg)  04/24/18 181 lb 12.8 oz (82.5 kg)   Temp Readings from Last 3 Encounters:  12/18/18 97.8 F (36.6 C) (Oral)  09/22/18 97.6 F (36.4 C) (Oral)  04/24/18 98.2 F (36.8 C) (Oral)   BP Readings from Last 3 Encounters:  12/18/18 102/72  09/22/18 132/86  04/24/18 122/78   Pulse Readings from Last 3 Encounters:  12/18/18 91  09/22/18 76  04/24/18 83    Physical Exam Vitals signs and nursing note reviewed.  Constitutional:      Appearance: Normal appearance. She is well-developed and well-groomed. She is obese.  HENT:     Head: Normocephalic and atraumatic.     Nose: Nose normal.     Mouth/Throat:     Mouth: Mucous membranes are moist.     Pharynx: Oropharynx is clear.  Eyes:     Conjunctiva/sclera: Conjunctivae normal.     Pupils: Pupils are equal, round, and reactive to light.  Cardiovascular:     Rate and Rhythm: Normal rate and regular rhythm.     Heart sounds: Normal heart sounds.  Pulmonary:     Effort: Pulmonary effort is normal.     Breath sounds: Normal breath sounds.  Skin:    General: Skin is warm and dry.     Findings: Rash present.     Comments: annualar scaly red rash to legs and arms  Hyperkeratotic rash to b/l elbows with scale    Neurological:     General: No focal deficit present.     Mental Status: She is alert and oriented to person, place, and time. Mental status is at baseline.     Gait: Gait normal.  Psychiatric:        Attention  and Perception: Attention and perception normal.        Mood and Affect: Mood and affect normal.  Speech: Speech normal.        Behavior: Behavior normal. Behavior is cooperative.        Thought Content: Thought content normal.        Cognition and Memory: Cognition and memory normal.        Judgment: Judgment normal.     Assessment   1. HTN  2. Annual rash to b/l arms and legs and hyperkeratotic rash to elbows b/l L>R  3. Vaginal atrophy and itching  4. HM Plan   1. Cont meds  2. Refer dermatology  rec use temovate to elbows  Micatin arms and legs, trial of diflucan x 1 if rash to arms/legs is tinea Refer to derm further recs  3. Can try estrace to vaginal region 2-3 x per week prn and or temovate prn sparingly  4.  Will need fasting labs in future orders in   Flu shot had 09/16/18  Tdap had zoster had Given info about shingrix and prevnar and pna 23prior visit pt wants to hold on vaccine for now -rec prevnar and pna 23 in future  Hep c, hiv neg   Pap4/5/19 neg neg HPV dexa 2014 negrec D3 1000 IU qdshe is taking D3 800 in [facility]. Of note also on glucosamine chrondroitin 1300 mg qd and calcium 1200 mg qd mammo sch 06/08/18 neg ordered again today  Colonoscopy 07/2013 tubular polyp x 1 repeat 10/2019referred today to Dr. Allen Norris or other Mds in practice pt does not want to go back to [place] where prior colonoscopy done -referred Dr. Vicente Males locally they were unable to contact her as of 09/2018 colonoscopy due   Dermatology Dr. Evorn Gong saw 12/29/18 given Clobetasol oint rec topical otc urea 40% cream to improve penetration of topical steroid on elbows f/u prn   Provider: Dr. Olivia Mackie McLean-[facility]

## 2018-12-29 DIAGNOSIS — L400 Psoriasis vulgaris: Secondary | ICD-10-CM | POA: Diagnosis not present

## 2019-01-08 ENCOUNTER — Encounter: Payer: Self-pay | Admitting: Internal Medicine

## 2019-01-12 ENCOUNTER — Telehealth: Payer: Self-pay | Admitting: Internal Medicine

## 2019-01-12 ENCOUNTER — Other Ambulatory Visit: Payer: Self-pay | Admitting: Internal Medicine

## 2019-01-12 DIAGNOSIS — I10 Essential (primary) hypertension: Secondary | ICD-10-CM

## 2019-01-12 DIAGNOSIS — E785 Hyperlipidemia, unspecified: Secondary | ICD-10-CM

## 2019-01-12 MED ORDER — ATORVASTATIN CALCIUM 10 MG PO TABS: 10.0000 mg | ORAL_TABLET | Freq: Every day | ORAL | 3 refills | Status: DC

## 2019-01-12 MED ORDER — LOSARTAN POTASSIUM-HCTZ 100-25 MG PO TABS: 1.0000 | ORAL_TABLET | Freq: Every day | ORAL | 3 refills | Status: DC

## 2019-03-30 ENCOUNTER — Encounter: Payer: Self-pay | Admitting: Sports Medicine

## 2019-03-30 ENCOUNTER — Ambulatory Visit (INDEPENDENT_AMBULATORY_CARE_PROVIDER_SITE_OTHER): Payer: Medicare HMO | Admitting: Sports Medicine

## 2019-03-30 ENCOUNTER — Telehealth: Payer: Self-pay

## 2019-03-30 VITALS — BP 119/79 | HR 76 | Ht 60.0 in | Wt 179.0 lb

## 2019-03-30 DIAGNOSIS — Z0000 Encounter for general adult medical examination without abnormal findings: Secondary | ICD-10-CM | POA: Diagnosis not present

## 2019-03-30 DIAGNOSIS — L409 Psoriasis, unspecified: Secondary | ICD-10-CM

## 2019-03-30 DIAGNOSIS — E6609 Other obesity due to excess calories: Secondary | ICD-10-CM

## 2019-03-30 DIAGNOSIS — R7303 Prediabetes: Secondary | ICD-10-CM | POA: Diagnosis not present

## 2019-03-30 DIAGNOSIS — E559 Vitamin D deficiency, unspecified: Secondary | ICD-10-CM | POA: Diagnosis not present

## 2019-03-30 DIAGNOSIS — E66812 Obesity, class 2: Secondary | ICD-10-CM

## 2019-03-30 DIAGNOSIS — D649 Anemia, unspecified: Secondary | ICD-10-CM | POA: Diagnosis not present

## 2019-03-30 DIAGNOSIS — Z6835 Body mass index (BMI) 35.0-35.9, adult: Secondary | ICD-10-CM | POA: Diagnosis not present

## 2019-03-30 DIAGNOSIS — Z23 Encounter for immunization: Secondary | ICD-10-CM | POA: Diagnosis not present

## 2019-03-30 DIAGNOSIS — E7800 Pure hypercholesterolemia, unspecified: Secondary | ICD-10-CM

## 2019-03-30 DIAGNOSIS — I10 Essential (primary) hypertension: Secondary | ICD-10-CM

## 2019-03-30 MED ORDER — COAL TAR 20 % SOLN: 1.0000 "application " | Freq: Two times a day (BID) | 3 refills | Status: DC

## 2019-03-30 MED ORDER — CALCIPOTRIENE 0.005 % EX CREA: TOPICAL_CREAM | Freq: Two times a day (BID) | CUTANEOUS | 3 refills | Status: DC

## 2019-03-30 NOTE — Patient Instructions (Signed)
Medications if failure of nutrition therapy would be Saxenda or phentermine

## 2019-03-30 NOTE — Progress Notes (Addendum)
Subjective:   Glenda Burke is a 68 y.o. female who presents for Medicare Annual (Subsequent) preventive examination.  Review of Systems:  Comprehensive, negative except as noted below.  Objective:     Vitals: BP 119/79   Pulse 76   Ht 5' (1.524 m)   Wt 179 lb (81.2 kg)   BMI 34.96 kg/m   Body mass index is 34.96 kg/m.  Advanced Directives 12/12/2015 12/07/2014  Does Patient Have a Medical Advance Directive? Yes Yes  Does patient want to make changes to medical advance directive? - No - Patient declined  Copy of [place] in Chart? No - copy requested -    Tobacco Social History   Tobacco Use  Smoking Status Never Smoker  Smokeless Tobacco Never Used     Counseling given: Not Answered   Clinical Intake:  Hypertension: Well-controlled.  Hyperlipidemia: Historically stable.  Skin rash: Present on the elbows, extensor surfaces of the shins, has a previous diagnosis of psoriasis, insufficient improvement with topical clobetasol.   Past Medical History:  Diagnosis Date  . Allergy   . Hypertension   . Menopausal state    Past Surgical History:  Procedure Laterality Date  . arm surgery Left    plate and screw  . [place]ONOSCOPY  ?   [place]  . DILATION AND CURETTAGE OF UTERUS    . polypectomy     Family History  Problem Relation Age of Onset  . Hypertension Mother   . Hyperlipidemia Mother   . Congestive Heart Failure Mother   . Stroke Father   . Diabetes Father   . Hyperlipidemia Father   . Hypertension Father   . Hypertension Sister   . Hyperlipidemia Sister   . Diabetes Sister   . Kidney disease Sister        on HD  . Kidney disease Brother        kidney transplant   . Hypertension Brother   . Hyperlipidemia Brother   . Diabetes Brother   . Colon cancer Neg Hx   . Stomach cancer Neg Hx   . Breast cancer Neg Hx    Social History   Socioeconomic History  . Marital status: Married    Spouse name: Not on file  .  Number of children: Not on file  . Years of education: Not on file  . Highest education level: Not on file  Occupational History  . Occupation: retired  Scientific laboratory technician  . Financial resource strain: Not on file  . Food insecurity    Worry: Not on file    Inability: Not on file  . Transportation needs    Medical: Not on file    Non-medical: Not on file  Tobacco Use  . Smoking status: Never Smoker  . Smokeless tobacco: Never Used  Substance and Sexual Activity  . Alcohol use: Yes    Comment: wine occasional  . Drug use: No  . Sexual activity: Yes    Partners: Male  Lifestyle  . Physical activity    Days per week: Not on file    Minutes per session: Not on file  . Stress: Not on file  Relationships  . Social Herbalist on phone: Not on file    Gets together: Not on file    Attends religious service: Not on file    Active member of club or organization: Not on file    Attends meetings of clubs or organizations: Not  on file    Relationship status: Not on file  Other Topics Concern  . Not on file  Social History Narrative   Retired [employer]    Married    From [place]    Likes to travel     Outpatient Encounter Medications as of 03/30/2019  Medication Sig  . aspirin 81 MG tablet Take 81 mg by mouth 3 (three) times a week.  Marland Kitchen atorvastatin (LIPITOR) 10 MG tablet Take 1 tablet (10 mg total) by mouth daily at 6 PM.  . calcipotriene (DOVONOX) 0.005 % cream Apply topically 2 (two) times daily.  . Calcium Carbonate-Vit D-Min (CALCIUM 1200 PO) Take by mouth daily.  . clobetasol cream (TEMOVATE) 9.74 % Apply 1 application topically 2 (two) times daily. Left elbow  . Coal Tar 20 % SOLN 1 application by Does not apply route 2 (two) times daily.  Marland Kitchen estradiol (ESTRACE VAGINAL) 0.1 MG/GM vaginal cream Place 1 Applicatorful vaginally 3 (three) times a week.  . Glucosamine-Chondroitin (GLUCOSAMINE CHONDR COMPLEX PO) Take 1,300 mg by mouth daily.  Marland Kitchen  losartan-hydrochlorothiazide (HYZAAR) 100-25 MG tablet Take 1 tablet by mouth daily.  . [DISCONTINUED] fluticasone (FLONASE) 50 MCG/ACT nasal spray One spray in each nostril twice a day, use left hand for right nostril, and right hand for left nostril.  . [DISCONTINUED] loratadine (CLARITIN) 10 MG tablet Take 1 tablet (10 mg total) by mouth daily.  . [DISCONTINUED] miconazole (MICATIN) 2 % cream Apply 1 application topically 2 (two) times daily. To legs and upper arm  . [DISCONTINUED] montelukast (SINGULAIR) 10 MG tablet Take 1 tablet (10 mg total) by mouth at bedtime.  . [DISCONTINUED] Multiple Vitamins-Minerals (MULTIVITAMIN ADULT PO) Take by mouth.   No facility-administered encounter medications on file as of 03/30/2019.     Activities of Daily Living No flowsheet data found.  Patient Care Team: Silverio Decamp, MD as PCP - General (Sports Medicine)    Assessment:   This is a routine wellness examination for Glenda Burke.  Exercise Activities and Dietary recommendations   I placed a referral for medical nutrition therapy.  Goals   None     Fall Risk Fall Risk  04/24/2018 01/16/2018 12/11/2017  Falls in the past year? No No No   Is the patient's home free of loose throw rugs in walkways, pet beds, electrical cords, etc?   yes      Grab bars in the bathroom? no      Handrails on the stairs?   yes      Adequate lighting?   yes  Timed Get Up and Go performed: Not needed  Depression Screen PHQ 2/9 Scores 04/24/2018 01/16/2018 12/11/2017  PHQ - 2 Score 0 0 0     Cognitive Function        Immunization History  Administered Date(s) Administered  . Influenza, High Dose Seasonal PF 09/16/2018  . Influenza,inj,Quad PF,6+ Mos 08/29/2015  . Pneumococcal Polysaccharide-23 03/30/2019  . Tdap 08/28/2016  . Zoster 08/28/2016    Qualifies for Shingles Vaccine? Done  Screening Tests Health Maintenance  Topic Date Due  . COLONOSCOPY  07/16/2018  . PNA vac Low Risk Adult (2 of 2  - PCV13) 03/29/2020  . MAMMOGRAM  06/08/2020  . TETANUS/TDAP  08/28/2026  . DEXA SCAN  Completed  . Hepatitis C Screening  Completed    Cancer Screenings: Lung: Low Dose CT Chest recommended if Age 72-80 years, 30 pack-year currently smoking OR have quit w/in 15years. Patient does not qualify. Breast:  Up to date on Mammogram? No   Up to date of Bone Density/Dexa? Yes Colorectal: Ordered  General: Well Developed, well nourished, and in no acute distress.  Neuro: Alert and oriented x3, extra-ocular muscles intact, sensation grossly intact. Cranial nerves II through XII are intact, motor, sensory, and coordinative functions are all intact. HEENT: Normocephalic, atraumatic, pupils equal round reactive to light, neck supple, no masses, no lymphadenopathy, thyroid nonpalpable. Oropharynx, nasopharynx, external ear canals are unremarkable. Skin: Warm and dry, no rashes noted.  Cardiac: Regular rate and rhythm, no murmurs rubs or gallops.  Respiratory: Clear to auscultation bilaterally. Not using accessory muscles, speaking in full sentences.  Abdominal: Soft, nontender, nondistended, positive bowel sounds, no masses, no organomegaly.  Musculoskeletal: Shoulder, elbow, wrist, hip, knee, ankle stable, and with full range of motion.    Plan:   Healthy female, see below  Annual physical exam Routine physical as above. Pneumococcal vaccine today. She is due for colonoscopy. She plans to schedule.  Essential hypertension Well-controlled, no changes.  Hyperlipidemia Rechecking lipids.  Obesity Referral to nutritionist. If insufficient weight loss we can consider Saxenda versus phentermine.  Psoriasis Insufficient relief with clobetasol. Adding topical coal tar. If this fails we will try calcipotriene.  Coal tar is too expensive, switching to calcipotriene.  Normocytic anemia Adding iron indices, reticulocyte count, hemoglobinopathy evaluation, B12 levels.  Normal iron indices,  B12, folate, retic all normal.  This is not iron deficiency.  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    I have personally reviewed and noted the following in the patient's chart:   . Medical and social history . Use of alcohol, tobacco or illicit drugs  . Current medications and supplements . Functional ability and status . Nutritional status . Physical activity . Advanced directives . List of other physicians . Hospitalizations, surgeries, and ER visits in previous 12 months . Vitals . Screenings to include cognitive, depression, and falls . Referrals and appointments  In addition, I have reviewed and discussed with patient certain preventive protocols, quality metrics, and best practice recommendations. A written personalized care plan for preventive services as well as general preventive health recommendations were provided to patient.     Aundria Mems, MD  06/08/2019

## 2019-03-30 NOTE — Assessment & Plan Note (Signed)
Rechecking lipids. 

## 2019-03-30 NOTE — Addendum Note (Signed)
Addended by: Silverio Decamp on: 03/30/2019 02:04 PM   Modules accepted: Orders

## 2019-03-30 NOTE — Assessment & Plan Note (Signed)
Routine physical as above. Pneumococcal vaccine today. She is due for colonoscopy. She plans to schedule.

## 2019-03-30 NOTE — Telephone Encounter (Signed)
[facility] sent [facility] for Calcipotriene 0.005%  Key: [id number]

## 2019-03-30 NOTE — Assessment & Plan Note (Signed)
Referral to nutritionist. If insufficient weight loss we can consider Saxenda versus phentermine.

## 2019-03-30 NOTE — Assessment & Plan Note (Addendum)
Insufficient relief with clobetasol. Adding topical coal tar. If this fails we will try calcipotriene.  Coal tar is too expensive, switching to calcipotriene.

## 2019-03-30 NOTE — Assessment & Plan Note (Signed)
Well controlled, no changes 

## 2019-03-31 ENCOUNTER — Telehealth: Payer: Self-pay | Admitting: Internal Medicine

## 2019-03-31 DIAGNOSIS — D649 Anemia, unspecified: Secondary | ICD-10-CM | POA: Insufficient documentation

## 2019-03-31 LAB — COMPLETE METABOLIC PANEL WITH GFR
AG Ratio: 1.3 (calc) (ref 1.0–2.5)
ALT: 29 U/L (ref 6–29)
AST: 26 U/L (ref 10–35)
Albumin: 3.8 g/dL (ref 3.6–5.1)
Alkaline phosphatase (APISO): 114 U/L (ref 37–153)
BUN: 19 mg/dL (ref 7–25)
CO2: 27 mmol/L (ref 20–32)
Calcium: 9.1 mg/dL (ref 8.6–10.4)
Chloride: 104 mmol/L (ref 98–110)
Creat: 0.83 mg/dL (ref 0.50–0.99)
GFR, Est African American: 85 mL/min/{1.73_m2} (ref >=60)
GFR, Est Non African American: 73 mL/min/{1.73_m2} (ref >=60)
Globulin: 3 g/dL (calc) (ref 1.9–3.7)
Glucose, Bld: 96 mg/dL (ref 65–99)
Potassium: 3.7 mmol/L (ref 3.5–5.3)
Sodium: 140 mmol/L (ref 135–146)
Total Bilirubin: 0.6 mg/dL (ref 0.2–1.2)
Total Protein: 6.8 g/dL (ref 6.1–8.1)

## 2019-03-31 LAB — CBC
HCT: 34.9 % — ABNORMAL LOW (ref 35.0–45.0)
Hemoglobin: 11.1 g/dL — ABNORMAL LOW (ref 11.7–15.5)
MCH: 28.2 pg (ref 27.0–33.0)
MCHC: 31.8 g/dL — ABNORMAL LOW (ref 32.0–36.0)
MCV: 88.6 fL (ref 80.0–100.0)
MPV: 11.7 fL (ref 7.5–12.5)
Platelets: 208 10*3/uL (ref 140–400)
RBC: 3.94 10*6/uL (ref 3.80–5.10)
RDW: 13.6 % (ref 11.0–15.0)
WBC: 5.8 10*3/uL (ref 3.8–10.8)

## 2019-03-31 LAB — LIPID PANEL W/REFLEX DIRECT LDL
Cholesterol: 139 mg/dL (ref <200)
HDL: 56 mg/dL (ref >=50)
LDL Cholesterol (Calc): 68 mg/dL (calc)
Non-HDL Cholesterol (Calc): 83 mg/dL (calc) (ref <130)
Total CHOL/HDL Ratio: 2.5 (calc) (ref <5.0)
Triglycerides: 71 mg/dL (ref <150)

## 2019-03-31 LAB — TSH: TSH: 1.57 mIU/L (ref 0.40–4.50)

## 2019-03-31 LAB — VITAMIN D 25 HYDROXY (VIT D DEFICIENCY, FRACTURES): Vit D, 25-Hydroxy: 34 ng/mL (ref 30–100)

## 2019-03-31 LAB — HEMOGLOBIN A1C
Hgb A1c MFr Bld: 6 % of total Hgb — ABNORMAL HIGH (ref <5.7)
Mean Plasma Glucose: 126 (calc)
eAG (mmol/L): 7 (calc)

## 2019-03-31 NOTE — Assessment & Plan Note (Addendum)
Adding iron indices, reticulocyte count, hemoglobinopathy evaluation, B12 levels.  Normal iron indices, B12, folate, retic all normal.  This is not iron deficiency.  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

## 2019-03-31 NOTE — Addendum Note (Signed)
Addended by: Silverio Decamp on: 03/31/2019 09:29 AM   Modules accepted: Orders

## 2019-03-31 NOTE — Telephone Encounter (Signed)
Received fax from [place] that Calcipotriene solution was approved from 10/12/2012 through 10/14/2019. Pharmacy notified and forms sent to scan.   Reference ID: [id number]

## 2019-03-31 NOTE — Telephone Encounter (Signed)
Pt called stating her insurance will allow Dr. Dianah Field to be her PCP again and she has changed back to him.

## 2019-03-31 NOTE — Telephone Encounter (Signed)
Who is she following now is Dr. Olivia Mackie her PCP ? Someone listed another provider in the chart   [facility]

## 2019-03-31 NOTE — Assessment & Plan Note (Signed)
>>  ASSESSMENT AND PLAN FOR NORMOCYTIC ANEMIA WRITTEN ON 06/08/2019 10:40 AM BY THEKKEKANDAM, THOMAS J  Adding iron indices, reticulocyte count, hemoglobinopathy evaluation, B12 levels.  Normal iron indices, B12, folate, retic all normal.  This is not iron deficiency.  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

## 2019-03-31 NOTE — Telephone Encounter (Signed)
Information has been sent to insurance and waiting on a response.   

## 2019-03-31 NOTE — Telephone Encounter (Signed)
Left message for patient to return call back. [facility] may obtain information.  

## 2019-04-05 NOTE — Progress Notes (Signed)
Spoken to patient. Dr Olivia Mackie is no longer PCP please see telephone note.

## 2019-05-13 ENCOUNTER — Telehealth: Payer: Self-pay | Admitting: Sports Medicine

## 2019-05-13 NOTE — Telephone Encounter (Signed)
Pt called. She is very frustrated. Someone left vm stating that she needed  to have bloodwork done. Glenda Burke states she called back to find out what  the bloodwork was for(she wants to be an informed customer). She said she left vm for  [name], Triage , front office. She left vm on Friday for front office and left messages as well last week for Triage and  [name]  and nobody returned her call. She states she had  never had this problem before and wanted to know what  number  can she  call to get through. Both she and her husband has been experiencing the same problem.

## 2019-05-18 NOTE — Telephone Encounter (Signed)
Thank you :)

## 2019-05-31 ENCOUNTER — Encounter: Payer: Self-pay | Admitting: Sports Medicine

## 2019-06-03 ENCOUNTER — Other Ambulatory Visit: Payer: Self-pay

## 2019-06-03 DIAGNOSIS — Z20822 Contact with and (suspected) exposure to covid-19: Secondary | ICD-10-CM

## 2019-06-04 ENCOUNTER — Encounter: Payer: Self-pay | Admitting: Gastroenterology

## 2019-06-04 LAB — NOVEL CORONAVIRUS, NAA: SARS-CoV-2, NAA: NOT DETECTED

## 2019-06-04 NOTE — Telephone Encounter (Signed)
err

## 2019-06-07 DIAGNOSIS — D649 Anemia, unspecified: Secondary | ICD-10-CM | POA: Diagnosis not present

## 2019-06-08 ENCOUNTER — Telehealth: Payer: Self-pay | Admitting: *Deleted

## 2019-06-08 DIAGNOSIS — R7303 Prediabetes: Secondary | ICD-10-CM

## 2019-06-08 NOTE — Telephone Encounter (Signed)
Done

## 2019-06-08 NOTE — Telephone Encounter (Signed)
Pt stopped me in the office and said she needs a new referral for a nutritionist sent to [facility] per her insurance.

## 2019-06-09 LAB — HEMOGLOBINOPATHY EVALUATION
Fetal Hemoglobin Testing: <1 % (ref 0.0–1.9)
HCT: 35.3 % (ref 35.0–45.0)
Hemoglobin A2 - HGBRFX: 2.1 % (ref 1.8–3.5)
Hemoglobin: 11.3 g/dL — ABNORMAL LOW (ref 11.7–15.5)
Hgb A: 96.9 % (ref >96.0)
MCH: 28.8 pg (ref 27.0–33.0)
MCV: 89.8 fL (ref 80.0–100.0)
RBC: 3.93 10*6/uL (ref 3.80–5.10)
RDW: 13.1 % (ref 11.0–15.0)

## 2019-06-09 LAB — B12 AND FOLATE PANEL
Folate: 10.6 ng/mL
Vitamin B-12: 668 pg/mL (ref 200–1100)

## 2019-06-09 LAB — RETICULOCYTES
ABS Retic: 34470 cells/uL (ref 20000–8000)
Retic Ct Pct: 0.9 %

## 2019-06-09 LAB — IRON,TIBC AND FERRITIN PANEL
%SAT: 37 % (calc) (ref 16–45)
Ferritin: 64 ng/mL (ref 16–288)
Iron: 103 ug/dL (ref 45–160)
TIBC: 281 mcg/dL (calc) (ref 250–450)

## 2019-06-24 ENCOUNTER — Encounter: Payer: Self-pay | Admitting: Gastroenterology

## 2019-06-24 ENCOUNTER — Ambulatory Visit (INDEPENDENT_AMBULATORY_CARE_PROVIDER_SITE_OTHER): Payer: Medicare HMO

## 2019-06-24 ENCOUNTER — Ambulatory Visit (AMBULATORY_SURGERY_CENTER): Payer: Self-pay

## 2019-06-24 ENCOUNTER — Other Ambulatory Visit: Payer: Self-pay

## 2019-06-24 VITALS — Ht 62.0 in | Wt 180.4 lb

## 2019-06-24 DIAGNOSIS — Z1231 Encounter for screening mammogram for malignant neoplasm of breast: Secondary | ICD-10-CM | POA: Diagnosis not present

## 2019-06-24 DIAGNOSIS — Z0000 Encounter for general adult medical examination without abnormal findings: Secondary | ICD-10-CM

## 2019-06-24 DIAGNOSIS — Z86010 Personal history of colonic polyps: Secondary | ICD-10-CM

## 2019-06-24 MED ORDER — PEG 3350-KCL-NA BICARB-NACL 420 G PO SOLR: 4000.0000 mL | Freq: Once | ORAL | 0 refills | Status: AC

## 2019-06-24 NOTE — Progress Notes (Signed)
Denies allergies to eggs or soy products. Denies complication of anesthesia or sedation. Denies use of weight loss medication. Denies use of O2.   Emmi instructions given for colonoscopy.  Patient states that she was concerned about the cost of the prep so instead of giving her Suprep I gave her Golytely. I spent an hour with the patient in Pre-Visit.

## 2019-06-25 ENCOUNTER — Other Ambulatory Visit: Payer: Self-pay | Admitting: Sports Medicine

## 2019-06-25 DIAGNOSIS — R928 Other abnormal and inconclusive findings on diagnostic imaging of breast: Secondary | ICD-10-CM

## 2019-07-08 ENCOUNTER — Telehealth: Payer: Self-pay

## 2019-07-08 NOTE — Telephone Encounter (Signed)
Covid-19 screening questions   Do you now or have you had a fever in the last 14 days? NO   Do you have any respiratory symptoms of shortness of breath or cough now or in the last 14 days? NO  Do you have any family members or close contacts with diagnosed or suspected Covid-19 in the past 14 days? NO  Have you been tested for Covid-19 and found to be positive? NO        

## 2019-07-09 ENCOUNTER — Other Ambulatory Visit: Payer: Self-pay

## 2019-07-09 ENCOUNTER — Encounter: Payer: Self-pay | Admitting: Gastroenterology

## 2019-07-09 ENCOUNTER — Ambulatory Visit (AMBULATORY_SURGERY_CENTER): Payer: Medicare HMO | Admitting: Gastroenterology

## 2019-07-09 VITALS — BP 126/78 | HR 75 | Temp 97.8°F | Resp 18 | Ht 62.0 in | Wt 180.0 lb

## 2019-07-09 DIAGNOSIS — Z86010 Personal history of colonic polyps: Secondary | ICD-10-CM

## 2019-07-09 DIAGNOSIS — Z1211 Encounter for screening for malignant neoplasm of colon: Secondary | ICD-10-CM | POA: Diagnosis not present

## 2019-07-09 DIAGNOSIS — D123 Benign neoplasm of transverse colon: Secondary | ICD-10-CM | POA: Diagnosis not present

## 2019-07-09 MED ORDER — SODIUM CHLORIDE 0.9 % IV SOLN: 500.0000 mL | INTRAVENOUS | Status: DC

## 2019-07-09 NOTE — Progress Notes (Signed)
A and O x3. Report to RN. Tolerated MAC anesthesia well.

## 2019-07-09 NOTE — Patient Instructions (Signed)
Impression/Recommendations:  Polyp handout given to patient. Diverticulosis handout given to patient. Hemorrhoid handout given to patient.  Resume previous diet. Continue present medications. Await pathology results.  Repeat colonoscopy in 5-10 years for surveillance.  Date to be determined after pathology results reviewed.  YOU HAD AN ENDOSCOPIC PROCEDURE TODAY AT [facility]:   Refer to the procedure report that was given to you for any specific questions about what was found during the examination.  If the procedure report does not answer your questions, please call your gastroenterologist to clarify.  If you requested that your care partner not be given the details of your procedure findings, then the procedure report has been included in a sealed envelope for you to review at your convenience later.  YOU SHOULD EXPECT: Some feelings of bloating in the abdomen. Passage of more gas than usual.  Walking can help get rid of the air that was put into your GI tract during the procedure and reduce the bloating. If you had a lower endoscopy (such as a colonoscopy or flexible sigmoidoscopy) you may notice spotting of blood in your stool or on the toilet paper. If you underwent a bowel prep for your procedure, you may not have a normal bowel movement for a few days.  Please Note:  You might notice some irritation and congestion in your nose or some drainage.  This is from the oxygen used during your procedure.  There is no need for concern and it should clear up in a day or so.  SYMPTOMS TO REPORT IMMEDIATELY:   Following lower endoscopy (colonoscopy or flexible sigmoidoscopy):  Excessive amounts of blood in the stool  Significant tenderness or worsening of abdominal pains  Swelling of the abdomen that is new, acute  Fever of 100F or higher For urgent or emergent issues, a gastroenterologist can be reached at any hour by calling [phone].   DIET:  We do recommend a  small meal at first, but then you may proceed to your regular diet.  Drink plenty of fluids but you should avoid alcoholic beverages for 24 hours.  ACTIVITY:  You should plan to take it easy for the rest of today and you should NOT DRIVE or use heavy machinery until tomorrow (because of the sedation medicines used during the test).    FOLLOW UP: Our staff will call the number listed on your records 48-72 hours following your procedure to check on you and address any questions or concerns that you may have regarding the information given to you following your procedure. If we do not reach you, we will leave a message.  We will attempt to reach you two times.  During this call, we will ask if you have developed any symptoms of COVID 19. If you develop any symptoms (ie: fever, flu-like symptoms, shortness of breath, cough etc.) before then, please call [phone].  If you test positive for Covid 19 in the 2 weeks post procedure, please call and report this information to [place].    If any biopsies were taken you will be contacted by phone or by letter within the next 1-3 weeks.  Please call us at [phone] if you have not heard about the biopsies in 3 weeks.    SIGNATURES/CONFIDENTIALITY: You and/or your care partner have signed paperwork which will be entered into your electronic medical record.  These signatures attest to the fact that that the information above on your After Visit Summary has been reviewed and is understood.  Full responsibility of the confidentiality of this discharge information lies with you and/or your care-partner.

## 2019-07-09 NOTE — Progress Notes (Signed)
Called to room to assist during endoscopic procedure.  Patient ID and intended procedure confirmed with present staff. Received instructions for my participation in the procedure from the performing physician.  

## 2019-07-09 NOTE — Op Note (Addendum)
[place] Patient Name: Glenda Burke Procedure Date: 07/09/2019 11:15 AM MRN: [id number] Endoscopist: Mauri Pole , MD Age: 68 Referring MD:  Date of Birth: [birth year] Gender: Female Account #: [id number] Procedure:                Colonoscopy Indications:              High risk colon cancer surveillance: Personal                            history of colonic polyps Medicines:                Monitored Anesthesia Care Procedure:                Pre-Anesthesia Assessment:                           - Prior to the procedure, a History and Physical                            was performed, and patient medications and                            allergies were reviewed. The patient's tolerance of                            previous anesthesia was also reviewed. The risks                            and benefits of the procedure and the sedation                            options and risks were discussed with the patient.                            All questions were answered, and informed consent                            was obtained. Prior Anticoagulants: The patient has                            taken no previous anticoagulant or antiplatelet                            agents. ASA Grade Assessment: II - A patient with                            mild systemic disease. After reviewing the risks                            and benefits, the patient was deemed in                            satisfactory condition to undergo the procedure.  After obtaining informed consent, the colonoscope                            was passed under direct vision. Throughout the                            procedure, the patient's blood pressure, pulse, and                            oxygen saturations were monitored continuously. The                            Colonoscope was introduced through the anus and                            advanced to the the terminal  ileum, with                            identification of the appendiceal orifice and IC                            valve. The colonoscopy was performed without                            difficulty. The patient tolerated the procedure                            well. The quality of the bowel preparation was                            good. The terminal ileum, ileocecal valve,                            appendiceal orifice, and rectum were photographed. Scope In: 11:18:58 AM Scope Out: 11:32:38 AM Scope Withdrawal Time: 0 hours 9 minutes 26 seconds  Total Procedure Duration: 0 hours 13 minutes 40 seconds  Findings:                 The perianal and digital rectal examinations were                            normal.                           Two sessile polyps were found in the hepatic                            flexure. The polyps were 1 to 2 mm in size. These                            polyps were removed with a cold biopsy forceps.                            Resection and retrieval were complete.  Scattered small-mouthed diverticula were found in                            the sigmoid colon, descending colon, transverse                            colon and ascending colon.                           Non-bleeding internal hemorrhoids were found during                            retroflexion. The hemorrhoids were small. Complications:            No immediate complications. Estimated Blood Loss:     Estimated blood loss was minimal. Impression:               - Two 1 to 2 mm polyps at the hepatic flexure,                            removed with a cold biopsy forceps. Resected and                            retrieved.                           - Diverticulosis in the sigmoid colon, in the                            descending colon, in the transverse colon and in                            the ascending colon.                           - Non-bleeding internal  hemorrhoids. Recommendation:           - Patient has a contact number available for                            emergencies. The signs and symptoms of potential                            delayed complications were discussed with the                            patient. Return to normal activities tomorrow.                            Written discharge instructions were provided to the                            patient.                           - Resume previous diet.                           -  Continue present medications.                           - Await pathology results.                           - Repeat colonoscopy in 5-10 years for surveillance                            based on pathology results. Mauri Pole, MD 07/09/2019 11:38:04 AM This report has been signed electronically.

## 2019-07-09 NOTE — Progress Notes (Signed)
Pt's states no medical or surgical changes since previsit or office visit.  Vs by [place] by [employer]

## 2019-07-13 ENCOUNTER — Telehealth: Payer: Self-pay

## 2019-07-13 NOTE — Telephone Encounter (Signed)
First attempt follow up call to pt, LM on VM

## 2019-07-13 NOTE — Telephone Encounter (Signed)
Called [phone] and the phone rang multiple times.  No answer and no answering machine picked up either.  Unable to leave a message for follow up call. maw

## 2019-07-16 ENCOUNTER — Encounter: Payer: Self-pay | Admitting: Gastroenterology

## 2019-07-22 ENCOUNTER — Other Ambulatory Visit: Payer: Self-pay

## 2019-07-22 ENCOUNTER — Ambulatory Visit
Admission: RE | Admit: 2019-07-22 | Discharge: 2019-07-22 | Disposition: A | Discharge status: Home / Self Care | Payer: Medicare HMO | Source: Ambulatory Visit | Attending: Sports Medicine | Admitting: Sports Medicine

## 2019-07-22 DIAGNOSIS — R928 Other abnormal and inconclusive findings on diagnostic imaging of breast: Secondary | ICD-10-CM

## 2019-07-22 DIAGNOSIS — N6001 Solitary cyst of right breast: Secondary | ICD-10-CM | POA: Diagnosis not present

## 2019-07-26 ENCOUNTER — Ambulatory Visit (INDEPENDENT_AMBULATORY_CARE_PROVIDER_SITE_OTHER): Payer: Medicare HMO | Admitting: Sports Medicine

## 2019-07-26 ENCOUNTER — Encounter: Payer: Self-pay | Admitting: Sports Medicine

## 2019-07-26 DIAGNOSIS — L409 Psoriasis, unspecified: Secondary | ICD-10-CM | POA: Diagnosis not present

## 2019-07-26 MED ORDER — CLOBETASOL PROPIONATE 0.05 % EX OINT: 1.0000 "application " | TOPICAL_OINTMENT | Freq: Two times a day (BID) | CUTANEOUS | 11 refills | Status: DC

## 2019-07-26 NOTE — Progress Notes (Signed)
Subjective:    CC: Follow-up  HPI: Psoriasis: Fantastic improvement with topical calcipotriene, still has some rash on both elbows, Burke legs.  She also has an itchy rash under Burke right bra strap.  I reviewed the past medical history, family history, social history, surgical history, and allergies today and no changes were needed.  Please see the problem list section below in epic for further details.  Past Medical History: Past Medical History:  Diagnosis Date  . Allergy   . Anemia   . Arthritis   . Cataract   . Hyperlipidemia   . Hypertension   . Menopausal state    Past Surgical History: Past Surgical History:  Procedure Laterality Date  . arm surgery Left    plate and screw  . [place]OS[place]  ?   [place] of [place]  . DILATION AND CURETTAGE OF UTERUS    . polypectomy     Social History: Social History   Socioeconomic History  . Marital status: Married    Spouse name: Not on file  . Number of children: Not on file  . Years of education: Not on file  . Highest education level: Not on file  Occupational History  . Occupation: retired  Scientific laboratory technician  . Financial resource strain: Not on file  . Food insecurity    Worry: Not on file    Inability: Not on file  . Transportation needs    Medical: Not on file    Non-medical: Not on file  Tobacco Use  . Smoking status: Never Smoker  . Smokeless tobacco: Never Used  Substance and Sexual Activity  . Alcohol use: Yes    Comment: wine occasional  . Drug use: No  . Sexual activity: Yes    Partners: Male  Lifestyle  . Physical activity    Days per week: Not on file    Minutes per session: Not on file  . Stress: Not on file  Relationships  . Social Herbalist on phone: Not on file    Gets together: Not on file    Attends religious service: Not on file    Active member of club or organization: Not on file    Attends meetings of clubs or organizations: Not on file    Relationship status: Not on file  Other  Topics Concern  . Not on file  Social History Narrative   Retired [employer]    Married    From [place]    Likes to travel    Family History: Family History  Problem Relation Age of Onset  . Hypertension Mother   . Hyperlipidemia Mother   . Congestive Heart Failure Mother   . Stroke Father   . Diabetes Father   . Hyperlipidemia Father   . Hypertension Father   . Hypertension Sister   . Hyperlipidemia Sister   . Diabetes Sister   . Kidney disease Sister        on HD  . Kidney disease Brother        kidney transplant   . Hypertension Brother   . Hyperlipidemia Brother   . Diabetes Brother   . Colon cancer Neg Hx   . Stomach cancer Neg Hx   . Breast cancer Neg Hx   . Esophageal cancer Neg Hx   . Rectal cancer Neg Hx    Allergies: Allergies  Allergen Reactions  . Hydrocodone Swelling   Medications: See med rec.  Review of Systems: No fevers, chills, night sweats,  weight loss, chest pain, or shortness of breath.   Objective:    General: Well Developed, well nourished, and in no acute distress.  Neuro: Alert and oriented x3, extra-ocular muscles intact, sensation grossly intact.  HEENT: Normocephalic, atraumatic, pupils equal round reactive to light, neck supple, no masses, no lymphadenopathy, thyroid nonpalpable.  There appears to be an ingrown hair/small epidermoid cyst behind Burke right ear. Skin: Warm and dry, still has a erythematous, scaly psoriatic rash on both elbows, there is a slight scaly rash under Burke right bra strap, she also has some roughness on the lateral aspect of Burke shins. Cardiac: Regular rate and rhythm, no murmurs rubs or gallops, no lower extremity edema.  Respiratory: Clear to auscultation bilaterally. Not using accessory muscles, speaking in full sentences.  Impression and Recommendations:    Psoriasis Significant improvement with calcipotriene, we are going to add clobetasol ointment rather than cream to be used twice daily with the  calcipotriene over Burke elbows, as well as clobetasol behind Burke bra strap on the right. She also has a small asymptomatic ingrown hair/sebaceous cyst behind Burke right ear, no further evaluation needed. Return as needed for this.   ___________________________________________ Glenda Burke. Dianah Field, M.D., ABFM., CAQSM. Primary Care and Sports Medicine [facility]  Adjunct Professor of [employer]

## 2019-07-26 NOTE — Assessment & Plan Note (Signed)
Significant improvement with calcipotriene, we are going to add clobetasol ointment rather than cream to be used twice daily with the calcipotriene over her elbows, as well as clobetasol behind her bra strap on the right. She also has a small asymptomatic ingrown hair/sebaceous cyst behind her right ear, no further evaluation needed. Return as needed for this.

## 2019-11-07 ENCOUNTER — Other Ambulatory Visit: Payer: Self-pay

## 2019-11-07 DIAGNOSIS — Z20822 Contact with and (suspected) exposure to covid-19: Secondary | ICD-10-CM | POA: Diagnosis not present

## 2019-11-08 LAB — NOVEL CORONAVIRUS, NAA: SARS-CoV-2, NAA: NOT DETECTED

## 2019-12-06 ENCOUNTER — Ambulatory Visit: Payer: Medicare HMO | Attending: Internal Medicine

## 2019-12-06 DIAGNOSIS — Z23 Encounter for immunization: Secondary | ICD-10-CM

## 2019-12-06 NOTE — Progress Notes (Signed)
   Covid-19 Vaccination Clinic  Name:  Glenda Burke    MRN: [id number] DOB: 1950-12-10  12/06/2019  [name] was observed post Covid-19 immunization for 15 minutes without incidence. She was provided with Vaccine Information Sheet and instruction to access the V-Safe system.   [name] was instructed to call 911 with any severe reactions post vaccine: Marland Kitchen Difficulty breathing  . Swelling of your face and throat  . A fast heartbeat  . A bad rash all over your body  . Dizziness and weakness    Immunizations Administered    Name Date Dose VIS Date Route   Pfizer COVID-19 Vaccine 12/06/2019  9:33 AM 0.3 mL 09/24/2019 Intramuscular   Manufacturer: [place]   Lot: [id number]   [place]: [id number]

## 2019-12-09 ENCOUNTER — Ambulatory Visit: Payer: Medicare HMO

## 2019-12-28 ENCOUNTER — Other Ambulatory Visit: Payer: Self-pay | Admitting: Sports Medicine

## 2019-12-28 ENCOUNTER — Ambulatory Visit: Payer: Medicare HMO | Attending: Internal Medicine

## 2019-12-28 DIAGNOSIS — Z23 Encounter for immunization: Secondary | ICD-10-CM

## 2019-12-28 DIAGNOSIS — E785 Hyperlipidemia, unspecified: Secondary | ICD-10-CM

## 2019-12-28 DIAGNOSIS — I10 Essential (primary) hypertension: Secondary | ICD-10-CM

## 2019-12-28 MED ORDER — LOSARTAN POTASSIUM-HCTZ 100-25 MG PO TABS: 1.0000 | ORAL_TABLET | Freq: Every day | ORAL | 3 refills | Status: DC

## 2019-12-28 MED ORDER — ATORVASTATIN CALCIUM 10 MG PO TABS: 10.0000 mg | ORAL_TABLET | Freq: Every day | ORAL | 3 refills | Status: DC

## 2019-12-28 NOTE — Progress Notes (Signed)
   Covid-19 Vaccination Clinic  Name:  Crystalin Metze    MRN: [id number] DOB: 02-13-1951  12/28/2019  [name] was observed post Covid-19 immunization for 15 minutes without incident. She was provided with Vaccine Information Sheet and instruction to access the V-Safe system.   Ms. Benninger was instructed to call 911 with any severe reactions post vaccine: Marland Kitchen Difficulty breathing  . Swelling of face and throat  . A fast heartbeat  . A bad rash all over body  . Dizziness and weakness   Immunizations Administered    Name Date Dose VIS Date Route   Pfizer COVID-19 Vaccine 12/28/2019 10:08 AM 0.3 mL 09/24/2019 Intramuscular   Manufacturer: [place]   Lot: [id number]   [place]: [id number]

## 2020-03-23 DIAGNOSIS — H5213 Myopia, bilateral: Secondary | ICD-10-CM | POA: Diagnosis not present

## 2020-04-19 DIAGNOSIS — Z0100 Encounter for examination of eyes and vision without abnormal findings: Secondary | ICD-10-CM | POA: Diagnosis not present

## 2020-04-28 ENCOUNTER — Encounter: Payer: Medicare HMO | Attending: Sports Medicine

## 2020-07-13 ENCOUNTER — Encounter: Payer: Self-pay | Admitting: Nurse Practitioner

## 2020-07-13 ENCOUNTER — Telehealth (INDEPENDENT_AMBULATORY_CARE_PROVIDER_SITE_OTHER): Payer: Medicare HMO | Admitting: Nurse Practitioner

## 2020-07-13 DIAGNOSIS — J0110 Acute frontal sinusitis, unspecified: Secondary | ICD-10-CM | POA: Diagnosis not present

## 2020-07-13 MED ORDER — AZITHROMYCIN 250 MG PO TABS: ORAL_TABLET | ORAL | 0 refills | Status: DC

## 2020-07-13 NOTE — Patient Instructions (Signed)
I strongly recommend that you get tested for COVID-19.  We are definitely seeing symptoms consistent with an upper respiratory infection or sinusitis in patients who are positive for COVID-19, especially older individuals who have been vaccinated against the virus.  I do recommend that you quarantine yourself for at least 10 days after the first day of symptoms so that you can avoid spreading this virus to others.  Please watch her husband closely and if he begins to develop symptoms I recommend that he receive testing immediately and you can let us know.  It  If you do get tested for COVID-19 please let us know the results immediately so we can update our records.  You would be eligible for a treatment with monoclonal antibodies for COVID-19 if you to come back as positive.  This treatment is an IV infusion that is done on the outpatient setting in [facility] hospital and is provided for patients who are considered high risk including those over the age of 17 and with certain conditions.  This one-time treatment approximately 2 hours from start to finish and you are allowed to go home afterwards.  It has shown to significantly reduce symptoms and improve recovery rate and prevent hospitalization in older adults.  If your symptoms are not improving or they get any worse please let us know immediately so that we can reassess.  Please continue the Mucinex and Flonase as directed.  You may also continue using the lozenges for your sore throat. I have called in a prescription for azithromycin for 5-day treatment.  Please complete all the antibiotic even if you feel better prior to the completion of all of the doses so that we can be assured that all of the bacteria has been eliminated.

## 2020-07-13 NOTE — Progress Notes (Signed)
Virtual Video Visit via MyChart Note-- CONVERTED TO TELEPHONE DUE TO TECHNICAL DIFFICULTIES ON THE PATIENT END  I connected with  Glenda Burke on 07/13/20 at  9:50 AM EDT by the video enabled telemedicine application for , MyChart, and verified that I am speaking with the correct person using two identifiers.   I introduced myself as a Designer, jewellery with the practice. We discussed the limitations of evaluation and management by telemedicine and the availability of in person appointments. The patient expressed understanding and agreed to proceed.  The patient is: at home  I am: in the office  Subjective:    CC:  Chief Complaint  Patient presents with  . URI    onset 3 days ago, fever up to 100.4 yesterday, non-productive cough, BA at onset but has improved, fatigue, diarrhea at onset which has resolved, chest congestion, decreased appetite, sinus congestion, PND, has been taking Mucinex, throat lozenges, flonase with minimal benefit    HPI: Glenda Burke is a 69 y.o. y/o female presenting via [place] today for symptoms of fever, non-productive cough, body aches, fatigue, diarrhea, chest congestion, decreased appetite, sinus congestion, and post nasal drip that have been present since this past weekend.   She reports that she has a long history of chest colds for which she is treated with azithromycin and Mucinex with complete relief.   She has been vaccinated against COVID-19.   She has been taking Mucinex and flonase in addition to lozenges with some relief.   She denies shortness of breath, wheezing, chest pain, nausea, vomiting, loss of taste, loss of smell.  She has not been around anyone who is ill and her husband who lives with her does not have any symptoms.   Past medical history, Surgical history, Family history not pertinant except as noted below, Social history, Allergies, and medications have been entered into the medical record, reviewed, and corrections made.    Review of Systems:  See HPI for pertinent positive and negatives  Objective:    General: Speaking clearly in complete sentences without any shortness of breath.   Alert and oriented x3.   Normal judgment.  No apparent acute distress. She is audibly congested and does have a dry sounding cough.   Impression and Recommendations:    1. Acute non-recurrent frontal sinusitis Symptoms and presentation consistent with acute nonrecurrent frontal sinusitis. Given the recent pandemic with COVID-19 and the symptom presentation in vaccinated individuals I do strongly recommend that she receive COVID-19 testing as soon as possible to rule this out. Recommend quarantine for at least 10 days past the first day of symptoms or once she has received a negative COVID-19 result to reduce the possibility of passing the virus to others. She would be eligible for monoclonal antibody infusion given her age and past medical history if she is positive for COVID-19. We will treat at this time with over-the-counter Mucinex and Flonase.  May continue usage of the throat lozenges for cough.  We will also send in a prescription for a Z-Pak which has been shown to be effective for her in the past. Follow-up if symptoms worsen or fail to improve with antibiotic therapy. - azithromycin (ZITHROMAX) 250 MG tablet; Take 2 tabs (500 mg) together on the first day, then 1 tab (250 mg) daily until prescription complete.  Dispense: 10 tablet; Refill: 0     I discussed the assessment and treatment plan with the patient. The patient was provided an opportunity to ask questions and all were answered.  The patient agreed with the plan and demonstrated an understanding of the instructions.   The patient was advised to call back or seek an in-person evaluation if the symptoms worsen or if the condition fails to improve as anticipated.  I provided 20 minutes of non-face-to-face interaction with this [place] visit including intake,  same-day documentation, and chart review. --Video visit converted to telephone visit as patient was unable to connect to my chart.  Orma Render, NP

## 2020-08-22 ENCOUNTER — Ambulatory Visit (INDEPENDENT_AMBULATORY_CARE_PROVIDER_SITE_OTHER): Payer: Medicare HMO | Admitting: Sports Medicine

## 2020-08-22 ENCOUNTER — Ambulatory Visit (INDEPENDENT_AMBULATORY_CARE_PROVIDER_SITE_OTHER): Payer: Medicare HMO

## 2020-08-22 ENCOUNTER — Other Ambulatory Visit: Payer: Self-pay | Admitting: Sports Medicine

## 2020-08-22 ENCOUNTER — Other Ambulatory Visit: Payer: Self-pay

## 2020-08-22 ENCOUNTER — Encounter: Payer: Self-pay | Admitting: Sports Medicine

## 2020-08-22 VITALS — BP 95/62 | HR 77 | Ht 62.0 in | Wt 172.0 lb

## 2020-08-22 DIAGNOSIS — M19012 Primary osteoarthritis, left shoulder: Secondary | ICD-10-CM | POA: Diagnosis not present

## 2020-08-22 DIAGNOSIS — M25512 Pain in left shoulder: Secondary | ICD-10-CM

## 2020-08-22 DIAGNOSIS — M19011 Primary osteoarthritis, right shoulder: Secondary | ICD-10-CM | POA: Diagnosis not present

## 2020-08-22 DIAGNOSIS — L409 Psoriasis, unspecified: Secondary | ICD-10-CM

## 2020-08-22 DIAGNOSIS — Z1231 Encounter for screening mammogram for malignant neoplasm of breast: Secondary | ICD-10-CM

## 2020-08-22 DIAGNOSIS — Z23 Encounter for immunization: Secondary | ICD-10-CM

## 2020-08-22 DIAGNOSIS — M25511 Pain in right shoulder: Secondary | ICD-10-CM

## 2020-08-22 DIAGNOSIS — G8929 Other chronic pain: Secondary | ICD-10-CM

## 2020-08-22 DIAGNOSIS — M180 Bilateral primary osteoarthritis of first carpometacarpal joints: Secondary | ICD-10-CM | POA: Diagnosis not present

## 2020-08-22 MED ORDER — DICLOFENAC SODIUM 2 % EX SOLN: 2.0000 | Freq: Two times a day (BID) | CUTANEOUS | 0 refills | Status: DC

## 2020-08-22 MED ORDER — CALCIPOTRIENE 0.005 % EX CREA: TOPICAL_CREAM | Freq: Two times a day (BID) | CUTANEOUS | 3 refills | Status: DC

## 2020-08-22 NOTE — Progress Notes (Signed)
    Procedures performed today:    None.  Independent interpretation of notes and tests performed by another provider:   None.  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 Adding rotator cuff rehab exercises, bilateral x-rays, return to see me in 6 weeks, formal physical therapy if no better.  Primary osteoarthritis of both first carpometacarpal joints Starting with topical Pennsaid (diclofenac 2% topical), return as needed for this, we may do injections in the future.    ___________________________________________ Gwen Her. Dianah Field, M.D., ABFM., CAQSM. Primary Care and [place] Instructor of [place] of [facility] of Medicine

## 2020-08-22 NOTE — Assessment & Plan Note (Addendum)
Starting with topical Pennsaid (diclofenac 2% topical), return as needed for this, we may do injections in the future.

## 2020-08-22 NOTE — Assessment & Plan Note (Signed)
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 Adding rotator cuff rehab exercises, bilateral x-rays, return to see me in 6 weeks, formal physical therapy if no better.

## 2020-09-07 ENCOUNTER — Other Ambulatory Visit: Payer: Self-pay | Admitting: Sports Medicine

## 2020-09-07 DIAGNOSIS — I10 Essential (primary) hypertension: Secondary | ICD-10-CM

## 2020-10-23 ENCOUNTER — Encounter: Payer: Medicare HMO | Admitting: Sports Medicine

## 2020-10-25 ENCOUNTER — Ambulatory Visit (INDEPENDENT_AMBULATORY_CARE_PROVIDER_SITE_OTHER): Payer: Medicare HMO | Admitting: Sports Medicine

## 2020-10-25 ENCOUNTER — Other Ambulatory Visit: Payer: Self-pay

## 2020-10-25 ENCOUNTER — Encounter: Payer: Self-pay | Admitting: Sports Medicine

## 2020-10-25 ENCOUNTER — Ambulatory Visit (INDEPENDENT_AMBULATORY_CARE_PROVIDER_SITE_OTHER): Payer: Medicare HMO

## 2020-10-25 VITALS — BP 106/72 | HR 79 | Ht 62.0 in | Wt 173.0 lb

## 2020-10-25 DIAGNOSIS — M170 Bilateral primary osteoarthritis of knee: Secondary | ICD-10-CM

## 2020-10-25 DIAGNOSIS — Z0000 Encounter for general adult medical examination without abnormal findings: Secondary | ICD-10-CM

## 2020-10-25 DIAGNOSIS — L650 Telogen effluvium: Secondary | ICD-10-CM | POA: Diagnosis not present

## 2020-10-25 DIAGNOSIS — I10 Essential (primary) hypertension: Secondary | ICD-10-CM

## 2020-10-25 DIAGNOSIS — L409 Psoriasis, unspecified: Secondary | ICD-10-CM

## 2020-10-25 DIAGNOSIS — Z1231 Encounter for screening mammogram for malignant neoplasm of breast: Secondary | ICD-10-CM | POA: Diagnosis not present

## 2020-10-25 MED ORDER — CLOBETASOL PROPIONATE 0.05 % EX OINT: 1.0000 "application " | TOPICAL_OINTMENT | Freq: Two times a day (BID) | CUTANEOUS | 11 refills | Status: DC

## 2020-10-25 MED ORDER — MINOXIDIL 5 % EX FOAM: 1.0000 "application " | Freq: Two times a day (BID) | CUTANEOUS | 11 refills | Status: DC

## 2020-10-25 MED ORDER — CALCIPOTRIENE 0.005 % EX CREA: TOPICAL_CREAM | Freq: Two times a day (BID) | CUTANEOUS | 3 refills | Status: DC

## 2020-10-25 NOTE — Assessment & Plan Note (Signed)
Noted psoriatic plaques on elbows, legs. She did have improvement with clobetasol and calcipotriene, refilling calcipotriene per her request.

## 2020-10-25 NOTE — Assessment & Plan Note (Signed)
Annual physical as above, we went over her depression screening, she does not need a PHQ-9. Mammogram today. We will consider pneumococcal 13 at the follow-up visit.

## 2020-10-25 NOTE — Assessment & Plan Note (Signed)
Known bilateral knee osteoarthritis, mild, she will do arthritis from Tylenol twice daily to 3 times daily.

## 2020-10-25 NOTE — Assessment & Plan Note (Signed)
Mild hair loss adding minoxidil. Pull test revealed about 5 strands of hair.

## 2020-10-25 NOTE — Progress Notes (Signed)
Subjective:    CC: Annual Physical Exam  HPI:  This patient is here for their annual physical  I reviewed the past medical history, family history, social history, surgical history, and allergies today and no changes were needed.  Please see the problem list section below in epic for further details.  Past Medical History: Past Medical History:  Diagnosis Date  . Allergy   . Anemia   . Arthritis   . Cataract   . Hyperlipidemia   . Hypertension   . Menopausal state    Past Surgical History: Past Surgical History:  Procedure Laterality Date  . arm surgery Left    plate and screw  . [place]ONOSCOPY  ?   [place] of [place]  . DILATION AND CURETTAGE OF UTERUS    . polypectomy     Social History: Social History   Socioeconomic History  . Marital status: Married    Spouse name: Not on file  . Number of children: Not on file  . Years of education: Not on file  . Highest education level: Not on file  Occupational History  . Occupation: retired  Tobacco Use  . Smoking status: Never Smoker  . Smokeless tobacco: Never Used  Vaping Use  . Vaping Use: Never used  Substance and Sexual Activity  . Alcohol use: Yes    Comment: wine occasional  . Drug use: No  . Sexual activity: Yes    Partners: Male  Other Topics Concern  . Not on file  Social History Narrative   Retired [employer]    Married    From [place]    Likes to travel    Social Determinants of Radio broadcast assistant Strain: Not on [employer] Insecurity: Not on file  Transportation Needs: Not on file  Physical Activity: Not on file  Stress: Not on file  Social Connections: Not on file   Family History: Family History  Problem Relation Age of Onset  . Hypertension Mother   . Hyperlipidemia Mother   . Congestive Heart Failure Mother   . Stroke Father   . Diabetes Father   . Hyperlipidemia Father   . Hypertension Father   . Hypertension Sister   . Hyperlipidemia Sister   . Diabetes Sister   .  Kidney disease Sister        on HD  . Kidney disease Brother        kidney transplant   . Hypertension Brother   . Hyperlipidemia Brother   . Diabetes Brother   . Colon cancer Neg Hx   . Stomach cancer Neg Hx   . Breast cancer Neg Hx   . Esophageal cancer Neg Hx   . Rectal cancer Neg Hx    Allergies: Allergies  Allergen Reactions  . Hydrocodone Swelling   Medications: See med rec.  Review of Systems: No headache, visual changes, nausea, vomiting, diarrhea, constipation, dizziness, abdominal pain, skin rash, fevers, chills, night sweats, swollen lymph nodes, weight loss, chest pain, body aches, joint swelling, muscle aches, shortness of breath, mood changes, visual or auditory hallucinations.  Objective:    General: Well Developed, well nourished, and in no acute distress.  Neuro: Alert and oriented x3, extra-ocular muscles intact, sensation grossly intact. Cranial nerves II through XII are intact, motor, sensory, and coordinative functions are all intact. HEENT: Normocephalic, atraumatic, pupils equal round reactive to light, neck supple, no masses, no lymphadenopathy, thyroid nonpalpable. Oropharynx, nasopharynx, external ear canals are unremarkable. Skin: Warm and dry, no  rashes noted.  Cardiac: Regular rate and rhythm, no murmurs rubs or gallops.  Respiratory: Clear to auscultation bilaterally. Not using accessory muscles, speaking in full sentences.  Abdominal: Soft, nontender, nondistended, positive bowel sounds, no masses, no organomegaly.  Musculoskeletal: Shoulder, elbow, wrist, hip, knee, ankle stable, and with full range of motion.  Impression and Recommendations:    The patient was counselled, risk factors were discussed, anticipatory guidance given.  Telogen effluvium Mild hair loss adding minoxidil. Pull test revealed about 5 strands of hair.  Psoriasis Noted psoriatic plaques on elbows, legs. She did have improvement with clobetasol and calcipotriene,  refilling calcipotriene per her request.  Primary osteoarthritis of both knees Known bilateral knee osteoarthritis, mild, she will do arthritis from Tylenol twice daily to 3 times daily.  Annual physical exam Annual physical as above, we went over her depression screening, she does not need a PHQ-9. Mammogram today. We will consider pneumococcal 13 at the follow-up visit.   ___________________________________________ Gwen Her. Dianah Field, M.D., ABFM., CAQSM. Primary Care and Sports Medicine [facility]  Adjunct Professor of [place] of [facility] of Medicine

## 2020-10-26 LAB — CBC
HCT: 37 % (ref 35.0–45.0)
Hemoglobin: 11.9 g/dL (ref 11.7–15.5)
MCH: 28.9 pg (ref 27.0–33.0)
MCHC: 32.2 g/dL (ref 32.0–36.0)
MCV: 89.8 fL (ref 80.0–100.0)
MPV: 11.6 fL (ref 7.5–12.5)
Platelets: 215 10*3/uL (ref 140–400)
RBC: 4.12 10*6/uL (ref 3.80–5.10)
RDW: 13.6 % (ref 11.0–15.0)
WBC: 7.3 10*3/uL (ref 3.8–10.8)

## 2020-10-26 LAB — COMPREHENSIVE METABOLIC PANEL
AG Ratio: 1.3 (calc) (ref 1.0–2.5)
ALT: 29 U/L (ref 6–29)
AST: 23 U/L (ref 10–35)
Albumin: 4.1 g/dL (ref 3.6–5.1)
Alkaline phosphatase (APISO): 129 U/L (ref 37–153)
BUN: 25 mg/dL (ref 7–25)
CO2: 27 mmol/L (ref 20–32)
Calcium: 9.6 mg/dL (ref 8.6–10.4)
Chloride: 101 mmol/L (ref 98–110)
Creat: 0.83 mg/dL (ref 0.50–0.99)
Globulin: 3.2 g/dL (calc) (ref 1.9–3.7)
Glucose, Bld: 97 mg/dL (ref 65–99)
Potassium: 3.8 mmol/L (ref 3.5–5.3)
Sodium: 139 mmol/L (ref 135–146)
Total Bilirubin: 0.8 mg/dL (ref 0.2–1.2)
Total Protein: 7.3 g/dL (ref 6.1–8.1)

## 2020-10-26 LAB — HEMOGLOBIN A1C
Hgb A1c MFr Bld: 6 % of total Hgb — ABNORMAL HIGH (ref <5.7)
Mean Plasma Glucose: 126 mg/dL
eAG (mmol/L): 7 mmol/L

## 2020-10-26 LAB — LIPID PANEL
Cholesterol: 144 mg/dL (ref <200)
HDL: 65 mg/dL (ref >=50)
LDL Cholesterol (Calc): 66 mg/dL (calc)
Non-HDL Cholesterol (Calc): 79 mg/dL (calc) (ref <130)
Total CHOL/HDL Ratio: 2.2 (calc) (ref <5.0)
Triglycerides: 58 mg/dL (ref <150)

## 2020-10-26 LAB — TSH: TSH: 1.64 mIU/L (ref 0.40–4.50)

## 2021-01-18 ENCOUNTER — Other Ambulatory Visit: Payer: Self-pay | Admitting: Sports Medicine

## 2021-01-18 DIAGNOSIS — E785 Hyperlipidemia, unspecified: Secondary | ICD-10-CM

## 2021-04-02 DIAGNOSIS — Z0100 Encounter for examination of eyes and vision without abnormal findings: Secondary | ICD-10-CM | POA: Diagnosis not present

## 2021-04-02 DIAGNOSIS — H524 Presbyopia: Secondary | ICD-10-CM | POA: Diagnosis not present

## 2021-04-24 ENCOUNTER — Ambulatory Visit: Payer: Medicare HMO | Admitting: Sports Medicine

## 2021-05-09 ENCOUNTER — Other Ambulatory Visit: Payer: Self-pay

## 2021-05-09 ENCOUNTER — Ambulatory Visit: Payer: Medicare HMO | Admitting: Sports Medicine

## 2021-05-09 ENCOUNTER — Ambulatory Visit (INDEPENDENT_AMBULATORY_CARE_PROVIDER_SITE_OTHER): Payer: Medicare HMO | Admitting: Sports Medicine

## 2021-05-09 ENCOUNTER — Other Ambulatory Visit: Payer: Self-pay | Admitting: Sports Medicine

## 2021-05-09 ENCOUNTER — Ambulatory Visit (INDEPENDENT_AMBULATORY_CARE_PROVIDER_SITE_OTHER): Payer: Medicare HMO

## 2021-05-09 DIAGNOSIS — M170 Bilateral primary osteoarthritis of knee: Secondary | ICD-10-CM

## 2021-05-09 DIAGNOSIS — N898 Other specified noninflammatory disorders of vagina: Secondary | ICD-10-CM

## 2021-05-09 DIAGNOSIS — Z0000 Encounter for general adult medical examination without abnormal findings: Secondary | ICD-10-CM

## 2021-05-09 DIAGNOSIS — L409 Psoriasis, unspecified: Secondary | ICD-10-CM

## 2021-05-09 MED ORDER — CLOBETASOL PROPIONATE 0.05 % EX OINT: 1.0000 "application " | TOPICAL_OINTMENT | Freq: Two times a day (BID) | CUTANEOUS | 11 refills | Status: DC

## 2021-05-09 MED ORDER — ESTRADIOL 0.1 MG/GM VA CREA: 1.0000 | TOPICAL_CREAM | VAGINAL | 12 refills | Status: DC

## 2021-05-09 MED ORDER — ACETAMINOPHEN ER 650 MG PO TBCR: EXTENDED_RELEASE_TABLET | ORAL | 3 refills | Status: AC

## 2021-05-09 MED ORDER — ESTRADIOL 0.1 MG/GM VA CREA
1.0000 | TOPICAL_CREAM | VAGINAL | 12 refills | Status: AC
Start: 2021-05-09 — End: ?

## 2021-05-09 NOTE — Assessment & Plan Note (Signed)
It does look like Zemirah is due for Prevnar 20 and Shingrix, she will look into these and let me know. If she would like to do it I will send it to her pharmacy.

## 2021-05-09 NOTE — Progress Notes (Signed)
    Procedures performed today:    None.  Independent interpretation of notes and tests performed by another provider:   None.  Brief History, Exam, Impression, and Recommendations:    Primary osteoarthritis of both knees Glenda Burke is a pleasant 70 year old female, known bilateral knee osteoarthritis, more recently she was on the elliptical and started to feel some pain at the medial joint line, really no swelling or mechanical symptoms. She has not been taking any Tylenol so she will restart arthritis strength Tylenol, I would like her to do some knee conditioning exercises, return to see me if no better in a month, at which point we can consider an unloader brace versus an injection. I would like some updated x-rays. Of note she does have psoriasis, and has a few psoriatic plaques on her elbows and fingers, if all of the above fails we can certainly consider Otezla.  Annual physical exam It does look like Glenda Burke is due for Prevnar 20 and Shingrix, she will look into these and let me know. If she would like to do it I will send it to her pharmacy.    ___________________________________________ Gwen Her. Dianah Field, M.D., ABFM., CAQSM. Primary Care and [place] Instructor of [place] of [facility] of Medicine

## 2021-05-09 NOTE — Assessment & Plan Note (Signed)
Glenda Burke is a pleasant 70 year old female, known bilateral knee osteoarthritis, more recently she was on the elliptical and started to feel some pain at the medial joint line, really no swelling or mechanical symptoms. She has not been taking any Tylenol so she will restart arthritis strength Tylenol, I would like her to do some knee conditioning exercises, return to see me if no better in a month, at which point we can consider an unloader brace versus an injection. I would like some updated x-rays. Of note she does have psoriasis, and has a few psoriatic plaques on her elbows and fingers, if all of the above fails we can certainly consider Otezla.

## 2021-10-24 ENCOUNTER — Ambulatory Visit (INDEPENDENT_AMBULATORY_CARE_PROVIDER_SITE_OTHER): Payer: PPO | Admitting: Sports Medicine

## 2021-10-24 ENCOUNTER — Other Ambulatory Visit: Payer: Self-pay

## 2021-10-24 DIAGNOSIS — L409 Psoriasis, unspecified: Secondary | ICD-10-CM

## 2021-10-24 DIAGNOSIS — I10 Essential (primary) hypertension: Secondary | ICD-10-CM | POA: Diagnosis not present

## 2021-10-24 DIAGNOSIS — E785 Hyperlipidemia, unspecified: Secondary | ICD-10-CM | POA: Diagnosis not present

## 2021-10-24 DIAGNOSIS — R0989 Other specified symptoms and signs involving the circulatory and respiratory systems: Secondary | ICD-10-CM | POA: Diagnosis not present

## 2021-10-24 MED ORDER — CLOBETASOL PROPIONATE 0.05 % EX OINT: 1.0000 "application " | TOPICAL_OINTMENT | Freq: Two times a day (BID) | CUTANEOUS | 11 refills | Status: DC

## 2021-10-24 MED ORDER — AZITHROMYCIN 250 MG PO TABS: ORAL_TABLET | ORAL | 0 refills | Status: DC

## 2021-10-24 MED ORDER — LOSARTAN POTASSIUM-HCTZ 100-25 MG PO TABS: 1.0000 | ORAL_TABLET | Freq: Every day | ORAL | 3 refills | Status: DC

## 2021-10-24 MED ORDER — CALCIPOTRIENE 0.005 % EX CREA: TOPICAL_CREAM | Freq: Two times a day (BID) | CUTANEOUS | 3 refills | Status: DC

## 2021-10-24 MED ORDER — ATORVASTATIN CALCIUM 10 MG PO TABS: 10.0000 mg | ORAL_TABLET | Freq: Every day | ORAL | 3 refills | Status: DC

## 2021-10-24 NOTE — Progress Notes (Signed)
° ° °  Procedures performed today:    None.  Independent interpretation of notes and tests performed by another provider:   None.  Brief History, Exam, Impression, and Recommendations:    Psoriasis Stable psoriasis on legs and elbows, continue calcipotriene and clobetasol, may consider dermatology referral when she comes back.  Throat clearing Excessive throat clearing in the mornings since Christmas, she did have an upper respiratory type infection. She will do some over-the-counter Flonase, if this does not help we can switch to acid blockade. Does not eat within 2 hours of laying in bed.    ___________________________________________ Gwen Her. Dianah Field, M.D., ABFM., CAQSM. Primary Care and [place] Instructor of [place] of [facility] of Medicine

## 2021-10-24 NOTE — Assessment & Plan Note (Signed)
Stable psoriasis on legs and elbows, continue calcipotriene and clobetasol, may consider dermatology referral when she comes back.

## 2021-10-24 NOTE — Assessment & Plan Note (Signed)
Excessive throat clearing in the mornings since Christmas, she did have an upper respiratory type infection. She will do some over-the-counter Flonase, if this does not help we can switch to acid blockade. Does not eat within 2 hours of laying in bed.

## 2021-10-26 ENCOUNTER — Ambulatory Visit: Payer: Medicare HMO | Admitting: Sports Medicine

## 2021-11-13 ENCOUNTER — Other Ambulatory Visit: Payer: Self-pay | Admitting: Sports Medicine

## 2021-11-13 DIAGNOSIS — I10 Essential (primary) hypertension: Secondary | ICD-10-CM

## 2021-12-26 ENCOUNTER — Ambulatory Visit (INDEPENDENT_AMBULATORY_CARE_PROVIDER_SITE_OTHER): Payer: PPO

## 2021-12-26 ENCOUNTER — Ambulatory Visit (INDEPENDENT_AMBULATORY_CARE_PROVIDER_SITE_OTHER): Payer: PPO | Admitting: Sports Medicine

## 2021-12-26 ENCOUNTER — Other Ambulatory Visit: Payer: Self-pay

## 2021-12-26 ENCOUNTER — Encounter: Payer: Self-pay | Admitting: Sports Medicine

## 2021-12-26 VITALS — BP 128/75 | HR 78

## 2021-12-26 DIAGNOSIS — M25512 Pain in left shoulder: Secondary | ICD-10-CM

## 2021-12-26 DIAGNOSIS — M170 Bilateral primary osteoarthritis of knee: Secondary | ICD-10-CM | POA: Diagnosis not present

## 2021-12-26 DIAGNOSIS — G8929 Other chronic pain: Secondary | ICD-10-CM

## 2021-12-26 DIAGNOSIS — M25511 Pain in right shoulder: Secondary | ICD-10-CM | POA: Diagnosis not present

## 2021-12-26 DIAGNOSIS — E785 Hyperlipidemia, unspecified: Secondary | ICD-10-CM | POA: Diagnosis not present

## 2021-12-26 NOTE — Assessment & Plan Note (Signed)
Pleasant 71 year old female, known bilateral knee osteoarthritis, historically did okay with some Tylenol, unfortunately having worsening of pain, swelling. ?She also has psoriasis, had a few psoriatic plaques on her elbows and fingers so we may certainly be dealing with a component of psoriatic arthritis as well, due to effusion and severe pain we did an aspiration and injection today. ?Return to see me in 6 weeks. ?

## 2021-12-26 NOTE — Progress Notes (Signed)
? ? ?  Procedures performed today:   ? ?Procedure: Real-time Ultrasound Guided aspiration/injection of right knee ?Device: Samsung HS60  ?Verbal informed consent obtained.  ?Time-out conducted.  ?Noted no overlying erythema, induration, or other signs of local infection.  ?Skin prepped in a sterile fashion.  ?Local anesthesia: Topical Ethyl chloride.  ?With sterile technique and under real time ultrasound guidance: Noted effusion, 18-gauge needle advanced into the suprapatellar recess, aspirated 20 mL of clear, straw-colored fluid, syringe switched and 1 cc Kenalog 40, 2 cc lidocaine, 2 cc bupivacaine injected easily ?Completed without difficulty  ?Advised to call if fevers/chills, erythema, induration, drainage, or persistent bleeding.  ?Images permanently stored and available for review in PACS.  ?Impression: Technically successful ultrasound guided aspiration/injection. ? ?Independent interpretation of notes and tests performed by another provider:  ? ?None. ? ?Brief History, Exam, Impression, and Recommendations:   ? ?Primary osteoarthritis of both knees ?Pleasant 71 year old female, known bilateral knee osteoarthritis, historically did okay with some Tylenol, unfortunately having worsening of pain, swelling. ?She also has psoriasis, had a few psoriatic plaques on her elbows and fingers so we may certainly be dealing with a component of psoriatic arthritis as well, due to effusion and severe pain we did an aspiration and injection today. ?Return to see me in 6 weeks. ? ?Bilateral shoulder pain ?Glenda Burke has a recurrence of right shoulder pain, we initially treated her for this in November 2019 with conservative treatment. ?She did well. ?Unfortunately she is having increasing impingement symptoms, weakness to abduction consistent with a rotator cuff tear. ?We will hit her again with the [place] exercises, she can see me back in about 6 weeks, we will consider MRI and referral for repair if not  better. ? ? ? ?___________________________________________ ?Glenda Her. Dianah Field, M.D., ABFM., CAQSM. ?Primary Care and Sports Medicine ?[place] ? ?Adjunct Instructor of Family Medicine  ?[employer] ?

## 2021-12-26 NOTE — Assessment & Plan Note (Signed)
Glenda Burke has a recurrence of right shoulder pain, we initially treated her for this in November 2019 with conservative treatment. ?She did well. ?Unfortunately she is having increasing impingement symptoms, weakness to abduction consistent with a rotator cuff tear. ?We will hit her again with the [place] exercises, she can see me back in about 6 weeks, we will consider MRI and referral for repair if not better. ?

## 2021-12-27 LAB — LIPID PANEL
Cholesterol: 154 mg/dL (ref <200)
HDL: 67 mg/dL (ref >=50)
LDL Cholesterol (Calc): 72 mg/dL (calc)
Non-HDL Cholesterol (Calc): 87 mg/dL (calc) (ref <130)
Total CHOL/HDL Ratio: 2.3 (calc) (ref <5.0)
Triglycerides: 74 mg/dL (ref <150)

## 2021-12-27 LAB — CBC
HCT: 38.2 % (ref 35.0–45.0)
Hemoglobin: 12.2 g/dL (ref 11.7–15.5)
MCH: 28.5 pg (ref 27.0–33.0)
MCHC: 31.9 g/dL — ABNORMAL LOW (ref 32.0–36.0)
MCV: 89.3 fL (ref 80.0–100.0)
MPV: 11.5 fL (ref 7.5–12.5)
Platelets: 229 10*3/uL (ref 140–400)
RBC: 4.28 10*6/uL (ref 3.80–5.10)
RDW: 13.4 % (ref 11.0–15.0)
WBC: 7.6 10*3/uL (ref 3.8–10.8)

## 2021-12-27 LAB — COMPREHENSIVE METABOLIC PANEL
AG Ratio: 1.3 (calc) (ref 1.0–2.5)
ALT: 16 U/L (ref 6–29)
AST: 17 U/L (ref 10–35)
Albumin: 4.1 g/dL (ref 3.6–5.1)
Alkaline phosphatase (APISO): 120 U/L (ref 37–153)
BUN: 17 mg/dL (ref 7–25)
CO2: 31 mmol/L (ref 20–32)
Calcium: 9.6 mg/dL (ref 8.6–10.4)
Chloride: 101 mmol/L (ref 98–110)
Creat: 0.93 mg/dL (ref 0.50–1.05)
Globulin: 3.2 g/dL (calc) (ref 1.9–3.7)
Glucose, Bld: 103 mg/dL — ABNORMAL HIGH (ref 65–99)
Potassium: 3.4 mmol/L — ABNORMAL LOW (ref 3.5–5.3)
Sodium: 141 mmol/L (ref 135–146)
Total Bilirubin: 0.6 mg/dL (ref 0.2–1.2)
Total Protein: 7.3 g/dL (ref 6.1–8.1)

## 2021-12-27 LAB — TSH: TSH: 2.03 mIU/L (ref 0.40–4.50)

## 2021-12-31 ENCOUNTER — Other Ambulatory Visit: Payer: Self-pay | Admitting: Sports Medicine

## 2021-12-31 DIAGNOSIS — I10 Essential (primary) hypertension: Secondary | ICD-10-CM

## 2022-01-23 ENCOUNTER — Ambulatory Visit (INDEPENDENT_AMBULATORY_CARE_PROVIDER_SITE_OTHER): Payer: PPO | Admitting: Sports Medicine

## 2022-01-23 ENCOUNTER — Telehealth: Payer: Self-pay | Admitting: Sports Medicine

## 2022-01-23 VITALS — BP 149/79 | HR 78 | Ht 62.0 in | Wt 183.0 lb

## 2022-01-23 DIAGNOSIS — I10 Essential (primary) hypertension: Secondary | ICD-10-CM

## 2022-01-23 DIAGNOSIS — M170 Bilateral primary osteoarthritis of knee: Secondary | ICD-10-CM

## 2022-01-23 DIAGNOSIS — L409 Psoriasis, unspecified: Secondary | ICD-10-CM

## 2022-01-23 DIAGNOSIS — Z0000 Encounter for general adult medical examination without abnormal findings: Secondary | ICD-10-CM | POA: Diagnosis not present

## 2022-01-23 DIAGNOSIS — E785 Hyperlipidemia, unspecified: Secondary | ICD-10-CM | POA: Diagnosis not present

## 2022-01-23 MED ORDER — CLOBETASOL PROPIONATE 0.05 % EX OINT: 1.0000 "application " | TOPICAL_OINTMENT | Freq: Two times a day (BID) | CUTANEOUS | 11 refills | Status: DC

## 2022-01-23 MED ORDER — LOSARTAN POTASSIUM-HCTZ 100-25 MG PO TABS: 1.0000 | ORAL_TABLET | Freq: Every day | ORAL | 3 refills | Status: DC

## 2022-01-23 MED ORDER — ATORVASTATIN CALCIUM 10 MG PO TABS: 10.0000 mg | ORAL_TABLET | Freq: Every day | ORAL | 3 refills | Status: DC

## 2022-01-23 NOTE — Telephone Encounter (Signed)
[employer]Vis[employer] paperwork faxed to [employer] at [phone] ?Request is for Orthovisc or Monovisc ?Pt's insurance prefers Orthovisc ?Fax confirmation receipt received  ?

## 2022-01-23 NOTE — Assessment & Plan Note (Signed)
Annual physical as above, she would like to defer Shingrix and pneumococcal 20 till next year. ?Routine labs were obtained earlier and were normal. ?

## 2022-01-23 NOTE — Assessment & Plan Note (Signed)
Got some kind of homeopathic treatment in [place] and has had improvement in her psoriasis, I did inform her that this would likely return, but happy that things are gone for now. ?

## 2022-01-23 NOTE — Assessment & Plan Note (Signed)
Persistent right knee pain in spite of injection at the last visit. ?Adding some physical therapy as she is still having significant pain and we will also get her approved for viscosupplementation. ?

## 2022-01-23 NOTE — Telephone Encounter (Signed)
Please work on [employer] or any other viscosupplement, right knee, failed steroid injections, home physical therapy, oral analgesics for greater than 6 weeks. ?

## 2022-01-23 NOTE — Progress Notes (Signed)
?Subjective:   ? ?CC: Annual Physical Exam ? ?HPI:  ?This patient is here for their annual physical ? ?I reviewed the past medical history, family history, social history, surgical history, and allergies today and no changes were needed.  Please see the problem list section below in epic for further details. ? ?Past Medical History: ?Past Medical History:  ?Diagnosis Date  ? Allergy   ? Anemia   ? Arthritis   ? Cataract   ? Hyperlipidemia   ? Hypertension   ? Menopausal state   ? ?Past Surgical History: ?Past Surgical History:  ?Procedure Laterality Date  ? arm surgery Left   ? plate and screw  ? [place]OS[place]  ?  [place]  ? DILATION AND CURETTAGE OF UTERUS    ? polypectomy    ? ?Social History: ?Social History  ? ?Socioeconomic History  ? Marital status: Married  ?  Spouse name: Not on file  ? Number of children: Not on file  ? Years of education: Not on file  ? Highest education level: Not on file  ?Occupational History  ? Occupation: retired  ?Tobacco Use  ? Smoking status: Never  ? Smokeless tobacco: Never  ?Vaping Use  ? Vaping Use: Never used  ?Substance and Sexual Activity  ? Alcohol use: Yes  ?  Comment: wine occasional  ? Drug use: No  ? Sexual activity: Yes  ?  Partners: Male  ?Other Topics Concern  ? Not on file  ?Social History Narrative  ? Retired [employer]   ? Married   ? From [place]   ? Likes to travel   ? ?Social Determinants of Health  ? ?Financial Resource Strain: Not on file  ?Food Insecurity: Not on file  ?Transportation Needs: Not on file  ?Physical Activity: Not on file  ?Stress: Not on file  ?Social Connections: Not on file  ? ?Family History: ?Family History  ?Problem Relation Age of Onset  ? Hypertension Mother   ? Hyperlipidemia Mother   ? Congestive Heart Failure Mother   ? Stroke Father   ? Diabetes Father   ? Hyperlipidemia Father   ? Hypertension Father   ? Hypertension Sister   ? Hyperlipidemia Sister   ? Diabetes Sister   ? Kidney disease Sister   ?     on HD  ? Kidney  disease Brother   ?     kidney transplant   ? Hypertension Brother   ? Hyperlipidemia Brother   ? Diabetes Brother   ? Colon cancer Neg Hx   ? Stomach cancer Neg Hx   ? Breast cancer Neg Hx   ? Esophageal cancer Neg Hx   ? Rectal cancer Neg Hx   ? ?Allergies: ?Allergies  ?Allergen Reactions  ? Hydrocodone Swelling  ? ?Medications: See med rec. ? ?Review of Systems: No headache, visual changes, nausea, vomiting, diarrhea, constipation, dizziness, abdominal pain, skin rash, fevers, chills, night sweats, swollen lymph nodes, weight loss, chest pain, body aches, joint swelling, muscle aches, shortness of breath, mood changes, visual or auditory hallucinations. ? ?Objective:   ? ?General: Well Developed, well nourished, and in no acute distress.  ?Neuro: Alert and oriented x3, extra-ocular muscles intact, sensation grossly intact. Cranial nerves II through XII are intact, motor, sensory, and coordinative functions are all intact. ?HEENT: Normocephalic, atraumatic, pupils equal round reactive to light, neck supple, no masses, no lymphadenopathy, thyroid nonpalpable. Oropharynx, nasopharynx, external ear canals are unremarkable. ?Skin: Warm and dry, no rashes noted.  ?  Cardiac: Regular rate and rhythm, no murmurs rubs or gallops.  ?Respiratory: Clear to auscultation bilaterally. Not using accessory muscles, speaking in full sentences.  ?Abdominal: Soft, nontender, nondistended, positive bowel sounds, no masses, no organomegaly.  ?Musculoskeletal: Shoulder, elbow, wrist, hip, knee, ankle stable, and with full range of motion. ? ?Impression and Recommendations:   ? ?The patient was counselled, risk factors were discussed, anticipatory guidance given. ? ?Annual physical exam ?Annual physical as above, she would like to defer Shingrix and pneumococcal 20 till next year. ?Routine labs were obtained earlier and were normal. ? ?Primary osteoarthritis of both knees ?Persistent right knee pain in spite of injection at the last  visit. ?Adding some physical therapy as she is still having significant pain and we will also get her approved for viscosupplementation. ? ?Psoriasis ?Got some kind of homeopathic treatment in [place] and has had improvement in her psoriasis, I did inform her that this would likely return, but happy that things are gone for now. ? ? ?___________________________________________ ?Gwen Her. Dianah Field, M.D., ABFM., CAQSM. ?Primary Care and Sports Medicine ?[place] ? ?Adjunct Professor of Family Medicine  ?[employer] ?

## 2022-01-24 NOTE — Telephone Encounter (Signed)
Benefits Investigation Details received from [employer] ?Injection: Orthovisc ? ?Medical: Deductible does not apply. Once the OOP has been met, patient is covered at 100%. Prior Authorization is not required.  ?PA required: No ? ? ?Pharmacy: The product is not covered under the pharmacy plan.  ? ? ?May fill through: [place] ?OV Copay/Coinsurance: $25 ?Product Copay: 20% approx. $140 ?Administration Coinsurance: 20% approx. $26  ?Administration Copay:  ? ?Out of Pocket Max: $3200 (met: $41.82)   ? ?Spoke with patient, who stated that she was going to try some PT prior to doing the visco injections. Advised her to let us know when she is ready to proceed with treatment. Patient requested a letter be mailed to her home address with the benefits and cost information. This was prepared and mailed today.  ?

## 2022-01-30 ENCOUNTER — Ambulatory Visit: Payer: PPO | Attending: Sports Medicine | Admitting: Physical Therapy

## 2022-01-30 ENCOUNTER — Other Ambulatory Visit: Payer: Self-pay

## 2022-01-30 DIAGNOSIS — R262 Difficulty in walking, not elsewhere classified: Secondary | ICD-10-CM | POA: Insufficient documentation

## 2022-01-30 DIAGNOSIS — M25561 Pain in right knee: Secondary | ICD-10-CM | POA: Insufficient documentation

## 2022-01-30 DIAGNOSIS — M170 Bilateral primary osteoarthritis of knee: Secondary | ICD-10-CM | POA: Diagnosis present

## 2022-01-30 DIAGNOSIS — G8929 Other chronic pain: Secondary | ICD-10-CM | POA: Diagnosis present

## 2022-01-30 NOTE — Therapy (Signed)
?OUTPATIENT PHYSICAL THERAPY LOWER EXTREMITY EVALUATION ? ? ?Patient Name: Glenda Burke ?MRN: [id number] ?DOB:06-Nov-1950, 71 y.o., female ?Today's Date: 01/31/2022 ? ? PT End of Session - 01/31/22 1254   ? ? Visit Number 1   ? Number of Visits 16   ? Date for PT Re-Evaluation 03/28/22   ? PT Start Time 1330   ? PT Stop Time 1420   ? PT Time Calculation (min) 50 min   ? Activity Tolerance Patient tolerated treatment well   ? Behavior During Therapy [facility] for tasks assessed/performed   ? ?  ?  ? ?  ? ? ?Past Medical History:  ?Diagnosis Date  ? Allergy   ? Anemia   ? Arthritis   ? Cataract   ? Hyperlipidemia   ? Hypertension   ? Menopausal state   ? ?Past Surgical History:  ?Procedure Laterality Date  ? arm surgery Left   ? plate and screw  ? [place]  [place] DILATION AND CURETTAGE OF UTERUS    ? polypectomy    ? ?Patient Active Problem List  ? Diagnosis Date Noted  ? Throat clearing 10/24/2021  ? 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 Primary osteoarthritis of both first carpometacarpal joints 08/22/2020  ? Normocytic anemia 03/31/2019  ? Polyp of colon 09/22/2018  ? Psoriasis 04/24/2018  ? Tubular adenoma of colon 01/16/2018  ? Prediabetes 12/11/2017  ? Primary osteoarthritis of both knees 12/11/2017  ? Trigger thumb of left hand 11/12/2016  ? Bilateral shoulder pain 12/07/2014  ? Hyperlipidemia 02/24/2013  ? Anemia 02/24/2013  ? Essential hypertension 02/18/2013  ? Obesity 02/18/2013  ? Environmental allergies 02/18/2013  ? Annual physical exam 02/18/2013  ? Chronic constipation 02/18/2013  ? ? ?PCP: Silverio Decamp, MD ? ?REFERRING PROVIDER: Silverio Decamp,* ? ?REFERRING DIAG: Bilateral Knee OA  ? ?THERAPY DIAG:  ?Chronic pain of right knee ? ?Difficulty in walking, not elsewhere classified ? ?Primary osteoarthritis of both knees ? ?ONSET DATE: 10/14/2021  ? ?SUBJECTIVE:  ? ?SUBJECTIVE STATEMENT: ?Patient reports that she feels most of her pain after walking for a longer amount of time.  After walking a long time, she begins to limb. She describes feeling heaviness in right leg and that there is swelling above the right knee even after the joint injections. She also struggles to keep it straight in right knee and experiences some pain in the medial part of her right knee when walking up stairs. Has had injections in knees which have helped somewhat with the pain. Pt reported that she started to feel pain in knee while walking on trip back in January.  ? ?PERTINENT HISTORY: ?Primary osteoarthritis of both knees ?Persistent right knee pain in spite of injection at the last visit. ?Adding some physical therapy as she is still having significant pain and we will also get her approved for viscosupplementation. ? ?PAIN:  ?Are you having pain? Yes: NPRS scale: 5/10 ?Pain location: Medial portion of right knee  ?Pain description: Achy ?Aggravating factors: Walking for a long amount of time and going upstairs ?Relieving factors: Basically not walking. Takes tylenol arthritis and diclofenac gel  ? ?PRECAUTIONS: None ? ?WEIGHT BEARING RESTRICTIONS No ? ?FALLS:  ?Has patient fallen in last 6 months? No ? ?LIVING ENVIRONMENT: ?Lives with: lives with their spouse ?Lives in: House/apartment ?Stairs: Yes: Internal: 13 steps; 1 railing on the right side  ?Has following equipment at home: None ? ?OCCUPATION: Retired  ? ?PLOF: Independent ? ?PATIENT GOALS  To feel less pain when walking for a long period of time and when walking upstairs  ? ? ?OBJECTIVE:  ? ?VITALS: BP 133/84 HR 80 Spo2 98 ? ?DIAGNOSTIC FINDINGS:  ? ?CLINICAL DATA:  Primary osteoarthritis of both knees. ?  ?EXAM: ?LEFT KNEE - COMPLETE 4+ VIEW; RIGHT KNEE - COMPLETE 4+ VIEW ?  ?COMPARISON:  Radiograph 06/02/2015 ?  ?FINDINGS: ?Right knee: Moderate medial tibiofemoral joint space narrowing. ?There is lateral patellar tilt and lateral patellofemoral joint ?space narrowing, with slight progression from prior exam. Moderate ?to advanced tricompartmental  peripheral osteophytes. No erosion, ?bone destruction, or focal osseous lesion. Small quadriceps tendon ?enthesophyte. No significant joint effusion. ?  ?Left knee: Minimal medial tibiofemoral joint space narrowing. There ?is lateral patellar tilt. Moderate to large tricompartmental ?osteophytes most prominently affecting the patellofemoral ?compartment. Subchondral cystic change in the lateral proximal tibia ?is unchanged from previous. No erosion, bone destruction or focal ?lesion. No significant joint effusion. Small quadriceps tendon ?enthesophyte. ?  ?IMPRESSION: ?1. Moderate tricompartmental osteoarthritis of both knees, ?patellofemoral predominant. Slight progression of right knee ?degenerative change from 2016 exam. ?2. Lateral patellar tilt bilaterally, right greater than left. ?  ?  ?Electronically Signed ?  By: Keith Rake M.D. ?  On: 05/10/2021 16:35 ? ?PATIENT SURVEYS:  ?FOTO 63/74 ? ?COGNITION: ? Overall cognitive status: Within functional limits for tasks assessed   ?  ?SENSATION: ?WFL ? ?MUSCLE LENGTH: ?Hamstrings: Right 90 deg; Left 90 deg ?Thomas test: Negative  ? ?POSTURE:  ?Normal, no abnormalities noted  ? ?PALPATION: ?Medial portion of right knee  ? ?GIRTH: R 16.5 inch, L 15.5 inch  ? ?LE ROM: ? ?Active ROM Right ?01/30/2022 Left ?01/30/2022  ?Hip flexion 120 120  ?Hip extension 30 30  ?Hip abduction 45 45  ?Hip adduction 30 30  ?Hip internal rotation 45 45  ?Hip external rotation 45 45  ?Knee flexion 135 135  ?Knee extension 0 0  ?Ankle dorsiflexion 20 20  ?Ankle plantarflexion 40 40  ?Ankle inversion 35 35  ?Ankle eversion 15 15  ? (Blank rows = not tested) ? ? ? ?LE MMT: ?                      ?                      ?MMT Right ?01/30/2022 Left ?01/30/2022  ?Hip flexion 5 5  ?Hip extension 5 5  ?Hip abduction 5 5  ?Hip adduction 5 5  ?Hip internal rotation 5 5  ?Hip external rotation 5 5  ?Knee flexion 5 5  ?Knee extension 5 5  ?Ankle dorsiflexion 5 5  ?Ankle plantarflexion 5 5  ?Ankle  inversion 5 5  ?Ankle eversion 5 5  ? (Blank rows = not tested) ? ? ? ? ? ?LOWER EXTREMITY SPECIAL TESTS:  ?Hip special tests: Saralyn Pilar (FABER) test: positive , FADIR negative  ?          Knee special tests: McMurray's test: negative ? ?FUNCTIONAL TESTS:  ?6 minute walk test: NT  ?Squat  No pain indicated  ? ? ? ?TODAY'S TREATMENT: ?01/30/22 ?Sidelying IT band stretch 2 x 60 sec  ?-min VC for body placement on edge of mat ? ? ? ?PATIENT EDUCATION:  ?Education details: form and technique for appropriate exercise. Explanation of OA and relationship to swelling and inflammation.  ?Person educated: Patient ?Education method: Explanation, Demonstration, Verbal cues, and Handouts ?Education comprehension: verbalized understanding, returned demonstration,  verbal cues required, and needs further education ? ? ?HOME EXERCISE PROGRAM: ?Access Code: [email or web address] ?URL: [email or web address] ?Date: 01/30/2022 ?Prepared by: Bradly Chris ? ?Exercises ?- Sidelying ITB Stretch off Table  - 1 x daily - 3 x weekly - 1 sets - 3 reps - 60 hold ? ?ASSESSMENT: ? ?CLINICAL IMPRESSION: ?Patient is a 72 y.o. female who was seen today for physical therapy evaluation and treatment for right knee pain secondary to moderate, tricompartment OA. She presents with limited deficits with main deficit being increased swelling as indicated by girth measurement. Unable to perform 6 mWT because of time, but will perform next session to see if this replicates her concordant pain. PT educated pt on swelling being a symptom of osteoarthritis, and to reach out to PCP for further medical management of pain. She will continue to benefit from skilled PT to decrease right knee pain to improve activity tolerance to resume aerobic activity.  ? ? ?OBJECTIVE IMPAIRMENTS Abnormal gait, difficulty walking, increased edema, obesity, and pain.  ? ?ACTIVITY LIMITATIONS shopping and walking on vaction .  ? ?PERSONAL FACTORS Age and Time since onset of  injury/illness/exacerbation are also affecting patient's functional outcome.  ? ? ?REHAB POTENTIAL: Fair time since onset and failed cortisone injections  ? ?CLINICAL DECISION MAKING: Stable/uncomplicated ? ?EVALUATION COM

## 2022-01-31 ENCOUNTER — Encounter: Payer: Self-pay | Admitting: Physical Therapy

## 2022-02-04 ENCOUNTER — Encounter: Payer: PPO | Admitting: Physical Therapy

## 2022-02-06 ENCOUNTER — Encounter: Payer: PPO | Admitting: Physical Therapy

## 2022-02-11 ENCOUNTER — Encounter: Payer: PPO | Admitting: Physical Therapy

## 2022-02-13 ENCOUNTER — Encounter: Payer: PPO | Admitting: Physical Therapy

## 2022-02-20 ENCOUNTER — Encounter: Payer: PPO | Admitting: Physical Therapy

## 2022-02-25 ENCOUNTER — Encounter: Payer: Self-pay | Admitting: Physical Therapy

## 2022-02-25 ENCOUNTER — Ambulatory Visit: Payer: PPO | Attending: Sports Medicine | Admitting: Physical Therapy

## 2022-02-25 DIAGNOSIS — M25561 Pain in right knee: Secondary | ICD-10-CM | POA: Insufficient documentation

## 2022-02-25 DIAGNOSIS — G8929 Other chronic pain: Secondary | ICD-10-CM | POA: Insufficient documentation

## 2022-02-25 DIAGNOSIS — R262 Difficulty in walking, not elsewhere classified: Secondary | ICD-10-CM | POA: Diagnosis present

## 2022-02-25 DIAGNOSIS — M170 Bilateral primary osteoarthritis of knee: Secondary | ICD-10-CM | POA: Diagnosis present

## 2022-02-25 NOTE — Therapy (Signed)
?OUTPATIENT PHYSICAL THERAPY TREATMENT NOTE ? ? ?Patient Name: Glenda Burke ?MRN: [id number] ?DOB:1951/09/15, 71 y.o., female ?Today's Date: 02/25/2022 ? ?PCP: Dr. Dianah Field  ?REFERRING PROVIDER: Dr. Dianah Field  ? ?END OF SESSION:  ? PT End of Session - 02/25/22 1425   ? ? Visit Number 2   ? Number of Visits 16   ? Date for PT Re-Evaluation 03/28/22   ? PT Start Time 1420   ? PT Stop Time 1500   ? PT Time Calculation (min) 40 min   ? Activity Tolerance Patient tolerated treatment well   ? Behavior During Therapy [facility] for tasks assessed/performed   ? ?  ?  ? ?  ? ? ?Past Medical History:  ?Diagnosis Date  ? Allergy   ? Anemia   ? Arthritis   ? Cataract   ? Hyperlipidemia   ? Hypertension   ? Menopausal state   ? ?Past Surgical History:  ?Procedure Laterality Date  ? arm surgery Left   ? plate and screw  ? [place]  ? DILATION AND CURETTAGE OF UTERUS    ? polypectomy    ? ?Patient Active Problem List  ? Diagnosis Date Noted  ? Throat clearing 10/24/2021  ? 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 Primary osteoarthritis of both first carpometacarpal joints 08/22/2020  ? Normocytic anemia 03/31/2019  ? Polyp of colon 09/22/2018  ? Psoriasis 04/24/2018  ? Tubular adenoma of colon 01/16/2018  ? Prediabetes 12/11/2017  ? Primary osteoarthritis of both knees 12/11/2017  ? Trigger thumb of left hand 11/12/2016  ? Bilateral shoulder pain 12/07/2014  ? Hyperlipidemia 02/24/2013  ? Anemia 02/24/2013  ? Essential hypertension 02/18/2013  ? Obesity 02/18/2013  ? Environmental allergies 02/18/2013  ? Annual physical exam 02/18/2013  ? Chronic constipation 02/18/2013  ? ? ?REFERRING DIAG: Dr. Dianah Field  ? ?THERAPY DIAG:  ?Chronic pain of right knee ? ?Difficulty in walking, not elsewhere classified ? ?Primary osteoarthritis of both knees ? ?PERTINENT HISTORY: Primary osteoarthritis of both knees ?Persistent right knee pain in spite of injection at the last visit. ?Adding some physical therapy as she is still  having significant pain and we will also get her approved for viscosupplementation. ? ?PRECAUTIONS: None  ? ?SUBJECTIVE: Pt reports that she had swelling in her right ankle and knee while on her trip in [place]. She went to doctor who said that it might be vascular and to get her varicose veins checked. Her knee did not hurt while walking on her trip despite walking. Pt feels pain mostly when crossing legs and placing left left on right knee.  ? ?PAIN:  ?Are you having pain? No ? ? ?OBJECTIVE: (objective measures completed at initial evaluation unless otherwise dated) ? ?VITALS: BP 133/84 HR 80 Spo2 98 ?  ?DIAGNOSTIC FINDINGS:  ?  ?CLINICAL DATA:  Primary osteoarthritis of both knees. ?  ?EXAM: ?LEFT KNEE - COMPLETE 4+ VIEW; RIGHT KNEE - COMPLETE 4+ VIEW ?  ?COMPARISON:  Radiograph 06/02/2015 ?  ?FINDINGS: ?Right knee: Moderate medial tibiofemoral joint space narrowing. ?There is lateral patellar tilt and lateral patellofemoral joint ?space narrowing, with slight progression from prior exam. Moderate ?to advanced tricompartmental peripheral osteophytes. No erosion, ?bone destruction, or focal osseous lesion. Small quadriceps tendon ?enthesophyte. No significant joint effusion. ?  ?Left knee: Minimal medial tibiofemoral joint space narrowing. There ?is lateral patellar tilt. Moderate to large tricompartmental ?osteophytes most prominently affecting the patellofemoral ?compartment. Subchondral cystic change in the lateral proximal tibia ?is unchanged  from previous. No erosion, bone destruction or focal ?lesion. No significant joint effusion. Small quadriceps tendon ?enthesophyte. ?  ?IMPRESSION: ?1. Moderate tricompartmental osteoarthritis of both knees, ?patellofemoral predominant. Slight progression of right knee ?degenerative change from 2016 exam. ?2. Lateral patellar tilt bilaterally, right greater than left. ?  ?  ?Electronically Signed ?  By: Keith Rake M.D. ?  On: 05/10/2021 16:35 ?  ?PATIENT SURVEYS:   ?FOTO 63/74 ?  ?COGNITION: ?          Overall cognitive status: Within functional limits for tasks assessed               ?           ?SENSATION: ?WFL ?  ?MUSCLE LENGTH: ?Hamstrings: Right 90 deg; Left 90 deg ?Thomas test: Negative  ?  ?POSTURE:  ?Normal, no abnormalities noted  ?  ?PALPATION: ?Medial portion of right knee  ?  ?GIRTH: R 16.5 inch, L 15.5 inch  ?  ?LE ROM: ?  ?Active ROM Right ?01/30/2022 Left ?01/30/2022  ?Hip flexion 120 120  ?Hip extension 30 30  ?Hip abduction 45 45  ?Hip adduction 30 30  ?Hip internal rotation 45 45  ?Hip external rotation 45 45  ?Knee flexion 135 135  ?Knee extension 0 0  ?Ankle dorsiflexion 20 20  ?Ankle plantarflexion 40 40  ?Ankle inversion 35 35  ?Ankle eversion 15 15  ? (Blank rows = not tested) ?  ?  ?  ?LE MMT: ?                      ?                      ?MMT Right ?01/30/2022 Left ?01/30/2022  ?Hip flexion 5 5  ?Hip extension 5 5  ?Hip abduction 5 5  ?Hip adduction 5 5  ?Hip internal rotation 5 5  ?Hip external rotation 5 5  ?Knee flexion 5 5  ?Knee extension 5 5  ?Ankle dorsiflexion 5 5  ?Ankle plantarflexion 5 5  ?Ankle inversion 5 5  ?Ankle eversion 5 5  ? (Blank rows = not tested) ?  ?  ? ?  ?  ?LOWER EXTREMITY SPECIAL TESTS:  ?Hip special tests: Saralyn Pilar (FABER) test: positive , FADIR negative  ?          Knee special tests: McMurray's test: negative ?  ?FUNCTIONAL TESTS:  ?6 minute walk test: NT  ?Squat  No pain indicated  ?  ?  ?  ?TODAY'S TREATMENT: ?02/25/22 ? ?THEREX: ? ?Nu-Step Resistance 3 with seat level 6  ?66mT: 1,450 ft- NPS 3/10 ? ?Pain focally over right pes anserinus  ? ?Seated Figure 4 Stretch 4 x 30 sec ? ?Supine Right Hip Abduction to Stretch Right Hip Abductors  ? ?MANUAL: ? ?Trigger point release of insertion of pes anserinus with some relief  ?-Pain not triggered with hip adduction ? ?01/30/22 ?Sidelying IT band stretch 2 x 60 sec  ?-min VC for body placement on edge of mat ?  ?  ?  ?PATIENT EDUCATION:  ?Education details: form and technique for  appropriate exercise. Explanation of OA and relationship to swelling and inflammation.  ?Person educated: Patient ?Education method: Explanation, Demonstration, Verbal cues, and Handouts ?Education comprehension: verbalized understanding, returned demonstration, verbal cues required, and needs further education ?  ?  ?HOME EXERCISE PROGRAM: ?Access Code: [email or web address]?URL: [email or web address] ?Date: 02/25/2022 ?Prepared by: DBradly Chris? ?Exercises ?- Seated Hip  External Rotation Stretch  - 1 x daily - 7 x weekly - 1 sets - 3 reps - 30 hold ?  ?ASSESSMENT: ?  ?CLINICAL IMPRESSION: ?Pt demonstrates focal pain over right anserinus indicating right knee pain is more muscular or soft tissue related rather than medial knee arthritis. Unable to complete rule in right adductors as pain generators but pain did relieve with soft tissue massage of muscular insertions. Also possibility of varicose veins, and pt is setting up appointment with PCP for further evaluation of varicose veins that are also located around medial side of right knee. Will continue to work soft tissue of pes anserinus given relief.  ?  ?OBJECTIVE IMPAIRMENTS Abnormal gait, difficulty walking, increased edema, obesity, and pain.  ?  ?ACTIVITY LIMITATIONS shopping and walking on vaction .  ?  ?PERSONAL FACTORS Age and Time since onset of injury/illness/exacerbation are also affecting patient's functional outcome.  ?  ?  ?REHAB POTENTIAL: Fair time since onset and failed cortisone injections  ?  ?CLINICAL DECISION MAKING: Stable/uncomplicated ?  ?EVALUATION COMPLEXITY: Low ?  ?  ?GOALS: ?Goals reviewed with patient? No ?  ?SHORT TERM GOALS: Target date: 02/14/2022 ?  ?Pt will be independent with HEP in order to improve strength and balance in order to decrease fall risk and improve function at home and work. ?Baseline: NT  ?Goal status: INITIAL ?  ?  ?  ?  ?LONG TERM GOALS: Target date: 03/28/2022 ?  ?Patient will have improved function and  activity level as evidenced by an increase in FOTO score by 10 points or more.  ?Baseline: 63/74 ?Goal status: INITIAL ?  ?2.  Patient will decrease right knee swelling as symmetrical knee girth for increased fu

## 2022-02-27 ENCOUNTER — Ambulatory Visit: Payer: PPO | Admitting: Physical Therapy

## 2022-02-27 ENCOUNTER — Encounter: Payer: Self-pay | Admitting: Physical Therapy

## 2022-02-27 DIAGNOSIS — M25561 Pain in right knee: Secondary | ICD-10-CM | POA: Diagnosis not present

## 2022-02-27 DIAGNOSIS — G8929 Other chronic pain: Secondary | ICD-10-CM

## 2022-02-27 DIAGNOSIS — R262 Difficulty in walking, not elsewhere classified: Secondary | ICD-10-CM

## 2022-02-27 DIAGNOSIS — M170 Bilateral primary osteoarthritis of knee: Secondary | ICD-10-CM

## 2022-02-27 NOTE — Therapy (Signed)
?OUTPATIENT PHYSICAL THERAPY TREATMENT NOTE ? ? ?Patient Name: Glenda Burke ?MRN: [id number] ?DOB:1951-05-30, 71 y.o., female ?Today's Date: 02/27/2022 ? ?PCP: Dr. Dianah Field  ?REFERRING PROVIDER: Dr. Dianah Field  ? ?END OF SESSION:  ? PT End of Session - 02/27/22 1337   ? ? Visit Number 3   ? Number of Visits 16   ? Date for PT Re-Evaluation 03/28/22   ? PT Start Time 1335   ? PT Stop Time 5462   ? PT Time Calculation (min) 40 min   ? Activity Tolerance Patient tolerated treatment well   ? Behavior During Therapy [facility] for tasks assessed/performed   ? ?  ?  ? ?  ? ? ?Past Medical History:  ?Diagnosis Date  ? Allergy   ? Anemia   ? Arthritis   ? Cataract   ? Hyperlipidemia   ? Hypertension   ? Menopausal state   ? ?Past Surgical History:  ?Procedure Laterality Date  ? arm surgery Left   ? plate and screw  ? [place]  [place] DILATION AND CURETTAGE OF UTERUS    ? polypectomy    ? ?Patient Active Problem List  ? Diagnosis Date Noted  ? Throat clearing 10/24/2021  ? 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 Primary osteoarthritis of both first carpometacarpal joints 08/22/2020  ? Normocytic anemia 03/31/2019  ? Polyp of colon 09/22/2018  ? Psoriasis 04/24/2018  ? Tubular adenoma of colon 01/16/2018  ? Prediabetes 12/11/2017  ? Primary osteoarthritis of both knees 12/11/2017  ? Trigger thumb of left hand 11/12/2016  ? Bilateral shoulder pain 12/07/2014  ? Hyperlipidemia 02/24/2013  ? Anemia 02/24/2013  ? Essential hypertension 02/18/2013  ? Obesity 02/18/2013  ? Environmental allergies 02/18/2013  ? Annual physical exam 02/18/2013  ? Chronic constipation 02/18/2013  ? ? ?REFERRING DIAG: Dr. Dianah Field  ? ?THERAPY DIAG:  ?Chronic pain of right knee ? ?Difficulty in walking, not elsewhere classified ? ?Primary osteoarthritis of both knees ? ?PERTINENT HISTORY: Primary osteoarthritis of both knees ?Persistent right knee pain in spite of injection at the last visit. ?Adding some physical therapy as she is still  having significant pain and we will also get her approved for viscosupplementation. ? ?PRECAUTIONS: None  ? ?SUBJECTIVE: Pt reports ongoing pain in medial side of right knee with walking especially when walking fast. Despite performing stretches, the pain has not decreased. She experiences more pain with weight bearing positions. Does not feel itchy feeling over medial surface of knee  ? ? ?PAIN:  ?Are you having pain? No ? ? ?OBJECTIVE: (objective measures completed at initial evaluation unless otherwise dated) ? ?VITALS: BP 133/84 HR 80 Spo2 98 ?  ?DIAGNOSTIC FINDINGS:  ?  ?CLINICAL DATA:  Primary osteoarthritis of both knees. ?  ?EXAM: ?LEFT KNEE - COMPLETE 4+ VIEW; RIGHT KNEE - COMPLETE 4+ VIEW ?  ?COMPARISON:  Radiograph 06/02/2015 ?  ?FINDINGS: ?Right knee: Moderate medial tibiofemoral joint space narrowing. ?There is lateral patellar tilt and lateral patellofemoral joint ?space narrowing, with slight progression from prior exam. Moderate ?to advanced tricompartmental peripheral osteophytes. No erosion, ?bone destruction, or focal osseous lesion. Small quadriceps tendon ?enthesophyte. No significant joint effusion. ?  ?Left knee: Minimal medial tibiofemoral joint space narrowing. There ?is lateral patellar tilt. Moderate to large tricompartmental ?osteophytes most prominently affecting the patellofemoral ?compartment. Subchondral cystic change in the lateral proximal tibia ?is unchanged from previous. No erosion, bone destruction or focal ?lesion. No significant joint effusion. Small quadriceps tendon ?enthesophyte. ?  ?  IMPRESSION: ?1. Moderate tricompartmental osteoarthritis of both knees, ?patellofemoral predominant. Slight progression of right knee ?degenerative change from 2016 exam. ?2. Lateral patellar tilt bilaterally, right greater than left. ?  ?  ?Electronically Signed ?  By: Keith Rake M.D. ?  On: 05/10/2021 16:35 ?  ?PATIENT SURVEYS:  ?FOTO 63/74 ?  ?COGNITION: ?          Overall cognitive  status: Within functional limits for tasks assessed               ?           ?SENSATION: ?WFL ?  ?MUSCLE LENGTH: ?Hamstrings: Right 90 deg; Left 90 deg ?Thomas test: Negative  ?  ?POSTURE:  ?Normal, no abnormalities noted  ?  ?PALPATION: ?Medial portion of right knee  ?  ?GIRTH: R 16.5 inch, L 15.5 inch  ?  ?LE ROM: ?  ?Active ROM Right ?01/30/2022 Left ?01/30/2022  ?Hip flexion 120 120  ?Hip extension 30 30  ?Hip abduction 45 45  ?Hip adduction 30 30  ?Hip internal rotation 45 45  ?Hip external rotation 45 45  ?Knee flexion 135 135  ?Knee extension 0 0  ?Ankle dorsiflexion 20 20  ?Ankle plantarflexion 40 40  ?Ankle inversion 35 35  ?Ankle eversion 15 15  ? (Blank rows = not tested) ?  ?  ?  ?LE MMT: ?                      ?                      ?MMT Right ?01/30/2022 Left ?01/30/2022  ?Hip flexion 5 5  ?Hip extension 5 5  ?Hip abduction 5 5  ?Hip adduction 5 5  ?Hip internal rotation 5 5  ?Hip external rotation 5 5  ?Knee flexion 5 5  ?Knee extension 5 5  ?Ankle dorsiflexion 5 5  ?Ankle plantarflexion 5 5  ?Ankle inversion 5 5  ?Ankle eversion 5 5  ? (Blank rows = not tested) ?  ?  ? ?  ?  ?LOWER EXTREMITY SPECIAL TESTS:  ?Hip special tests: Saralyn Pilar (FABER) test: positive , FADIR negative  ?          Knee special tests: McMurray's test: negative ?  ?FUNCTIONAL TESTS:  ?6 minute walk test: NT  ?Squat  No pain indicated  ?  ?  ?  ?TODAY'S TREATMENT: ?02/27/22  ? ?THEREX:  ? ?TM at 2.5 mph for 5 min  ?-Pt reports increased pain along medial joint line of right knee NPS 3/10  ? ?Meniscus Cluster: ?McMurry's - R  ?Thessley's Test  + R  ?Hyperflexion + R  ?Hyperextension - R  ?Apley's Test - R  ?Joint Line Tenderness along medial joint line of right knee  ?R Knee Extension  5  ? ?MANUAL ? ?Soft tissue massage over right pes anserinus and right semimembranosus and semitendinosus tendons and varicose veins.  ?-Noted muscular tension  ?-Pt reports improved pain response to walking after receiving massage   ? ?02/25/22 ? ?THEREX: ? ?Nu-Step Resistance 3 with seat level 6  ?67mT: 1,450 ft- NPS 3/10 ? ?Pain focally over right pes anserinus  ? ?Seated Figure 4 Stretch 4 x 30 sec ? ?Supine Right Hip Abduction to Stretch Right Hip Abductors  ? ?MANUAL: ? ?Trigger point release of insertion of pes anserinus with some relief  ?-Pain not triggered with hip adduction ? ?01/30/22 ?Sidelying IT band stretch  2 x 60 sec  ?-min VC for body placement on edge of mat ?  ?  ?  ?PATIENT EDUCATION:  ?Education details: form and technique for appropriate exercise. Explanation of OA and relationship to swelling and inflammation.  ?Person educated: Patient ?Education method: Explanation, Demonstration, Verbal cues, and Handouts ?Education comprehension: verbalized understanding, returned demonstration, verbal cues required, and needs further education ?  ?  ?HOME EXERCISE PROGRAM: ?Access Code: [email or web address] ?URL: [email or web address] ?Date: 02/25/2022 ?Prepared by: Bradly Chris ? ?Exercises ?- Seated Hip External Rotation Stretch  - 1 x daily - 7 x weekly - 1 sets - 3 reps - 30 hold ?  ?ASSESSMENT: ?  ?CLINICAL IMPRESSION: ?Pt continues to present with ongoing focal pain on medial side of right knee with activity that improved with MFR and soft tissue massage of surrounding tissues to medial joint line of right knee. Meniscus injury not completely ruled out as origin of underlying symptoms, but seems less likely given that she only has two items of cluster. Unable to rule out varicose veins but seems less likely given that pain does not worsen after prolonged sitting or standing and there is no itching sensation over area. Appears that soft tissue is the most likely cause of underlying symptoms given increased muscular tension and relief of pain with massage. However, this does not explain ongoing swelling and increased girth of right knee. Will continue to examine activity response to soft tissue treatment and awaiting more  information from further workup by medical team to rule out other potential medical causes like varicose veins.  ? ? ? ?  ?OBJECTIVE IMPAIRMENTS Abnormal gait, difficulty walking, increased edema, obesity, and pain.  ?  ?ACTIVITY

## 2022-02-28 ENCOUNTER — Ambulatory Visit (INDEPENDENT_AMBULATORY_CARE_PROVIDER_SITE_OTHER): Payer: PPO | Admitting: Sports Medicine

## 2022-02-28 ENCOUNTER — Encounter: Payer: Self-pay | Admitting: Sports Medicine

## 2022-02-28 DIAGNOSIS — M170 Bilateral primary osteoarthritis of knee: Secondary | ICD-10-CM

## 2022-02-28 DIAGNOSIS — I10 Essential (primary) hypertension: Secondary | ICD-10-CM | POA: Diagnosis not present

## 2022-02-28 NOTE — Assessment & Plan Note (Signed)
Well-controlled today, she has noted some elevated readings at home but notes that she checks her blood pressure right when waking up, I have advised her to sit and relax for 15 minutes and then to check her blood pressure. No symptoms. Continue current medications

## 2022-02-28 NOTE — Progress Notes (Signed)
    Procedures performed today:    None.  Independent interpretation of notes and tests performed by another provider:   None.  Brief History, Exam, Impression, and Recommendations:    Primary osteoarthritis of both knees Pleasant 71 year old female, known bilateral knee osteoarthritis, she had an injection back in March and continued to have pain so we worked on getting her approved for viscosupplementation, she was approved. We did decide to have her do some more physical therapy which she did religiously, she really does not have any pain at rest and only has some pain with walking. She did stop her Tylenol. She is curious about next steps. I did advise to get back on the arthritis from Tylenol twice daily, and should she have persistent intolerable discomfort that she cannot live with we could proceed with an MRI to evaluate the degree of meniscal tearing. If significant meniscal tearing and only minimal cartilage loss we would refer for arthroscopy, if no meniscal tearing and only cartilage loss then we would proceed with viscosupplementation. I did explain to her the limitations and lack of success with arthroscopic debridement of meniscal injuries in the setting of underlying osteoarthritis. She will return to see me in about a month, and if insufficient improvement we will proceed with MRI.  Essential hypertension Well-controlled today, she has noted some elevated readings at home but notes that she checks her blood pressure right when waking up, I have advised her to sit and relax for 15 minutes and then to check her blood pressure. No symptoms. Continue current medications    ___________________________________________ Gwen Her. Dianah Field, M.D., ABFM., CAQSM. Primary Care and [place] [employer] of [employer] [facility] [employer]

## 2022-02-28 NOTE — Assessment & Plan Note (Signed)
Pleasant 71 year old female, known bilateral knee osteoarthritis, she had an injection back in March and continued to have pain so we worked on getting her approved for viscosupplementation, she was approved. We did decide to have her do some more physical therapy which she did religiously, she really does not have any pain at rest and only has some pain with walking. She did stop her Tylenol. She is curious about next steps. I did advise to get back on the arthritis from Tylenol twice daily, and should she have persistent intolerable discomfort that she cannot live with we could proceed with an MRI to evaluate the degree of meniscal tearing. If significant meniscal tearing and only minimal cartilage loss we would refer for arthroscopy, if no meniscal tearing and only cartilage loss then we would proceed with viscosupplementation. I did explain to her the limitations and lack of success with arthroscopic debridement of meniscal injuries in the setting of underlying osteoarthritis. She will return to see me in about a month, and if insufficient improvement we will proceed with MRI.

## 2022-03-04 ENCOUNTER — Ambulatory Visit: Payer: PPO | Admitting: Physical Therapy

## 2022-03-04 ENCOUNTER — Encounter: Payer: Self-pay | Admitting: Physical Therapy

## 2022-03-04 DIAGNOSIS — M170 Bilateral primary osteoarthritis of knee: Secondary | ICD-10-CM

## 2022-03-04 DIAGNOSIS — M25561 Pain in right knee: Secondary | ICD-10-CM | POA: Diagnosis not present

## 2022-03-04 DIAGNOSIS — G8929 Other chronic pain: Secondary | ICD-10-CM

## 2022-03-04 DIAGNOSIS — R262 Difficulty in walking, not elsewhere classified: Secondary | ICD-10-CM

## 2022-03-04 NOTE — Therapy (Signed)
OUTPATIENT PHYSICAL THERAPY TREATMENT NOTE   Patient Name: Glenda Burke MRN: [id number] DOB:01/09/1951, 71 y.o., female Today's Date: 03/04/2022  PCP: Dr. Dianah Field  REFERRING PROVIDER: Dr. Dianah Field   END OF SESSION:   PT End of Session - 03/04/22 1552     Visit Number 4    Number of Visits 16    Date for PT Re-Evaluation 03/28/22    PT Start Time 1550    PT Stop Time 1630    PT Time Calculation (min) 40 min    Activity Tolerance Patient tolerated treatment well    Behavior During Therapy [facility] for tasks assessed/performed             Past Medical History:  Diagnosis Date   Allergy    Anemia    Arthritis    Cataract    Hyperlipidemia    Hypertension    Menopausal state    Past Surgical History:  Procedure Laterality Date   arm surgery Left    plate and screw   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 Dianah Field   THERAPY DIAG:  Chronic pain of right knee  Difficulty in walking, not elsewhere classified  Primary osteoarthritis of both knees  PERTINENT HISTORY: Primary osteoarthritis of both knees Persistent right knee pain in spite of injection at the last visit. Adding some physical therapy as she is still  having significant pain and we will also get her approved for viscosupplementation.  PRECAUTIONS: None   SUBJECTIVE: Pt reports that she is feeling better today even after walking for prolonged distances. She recently saw her PCP for eval and treatment of her right medial knee pain which he believes is meniscal and he will likely order MRI after she returns from vacation.    PAIN:  Are you having pain? No   OBJECTIVE: (objective measures completed at initial evaluation unless otherwise dated)  VITALS: BP 133/84 HR 80 Spo2 98   DIAGNOSTIC FINDINGS:    CLINICAL DATA:  Primary osteoarthritis of both knees.   EXAM: LEFT KNEE - COMPLETE 4+ VIEW; RIGHT KNEE - COMPLETE 4+ VIEW   COMPARISON:  Radiograph 06/02/2015   FINDINGS: Right knee: Moderate medial tibiofemoral joint space narrowing. There is lateral patellar tilt and lateral patellofemoral joint space narrowing, with slight progression from prior exam. Moderate to advanced tricompartmental peripheral osteophytes. No erosion, bone destruction, or focal osseous lesion. Small quadriceps tendon enthesophyte. No significant joint effusion.   Left knee: Minimal medial tibiofemoral joint space narrowing. There is lateral patellar tilt. Moderate to large tricompartmental osteophytes most prominently affecting the patellofemoral compartment. Subchondral cystic change in the lateral proximal tibia is unchanged from previous. No erosion, bone destruction or focal lesion. No significant joint effusion. Small quadriceps tendon  enthesophyte.   IMPRESSION: 1. Moderate tricompartmental osteoarthritis of both knees, patellofemoral predominant. Slight progression of right knee degenerative change from 2016 exam. 2. Lateral patellar tilt bilaterally, right greater than left.     Electronically Signed   By: Keith Rake M.D.   On: 05/10/2021 16:35   PATIENT SURVEYS:  FOTO 63/74   COGNITION:           Overall cognitive status:  Within functional limits for tasks assessed                          SENSATION: WFL   MUSCLE LENGTH: Hamstrings: Right 90 deg; Left 90 deg Thomas test: Negative    POSTURE:  Normal, no abnormalities noted    PALPATION: Medial portion of right knee    GIRTH: R 16.5 inch, L 15.5 inch    LE ROM:   Active ROM Right 01/30/2022 Left 01/30/2022  Hip flexion 120 120  Hip extension 30 30  Hip abduction 45 45  Hip adduction 30 30  Hip internal rotation 45 45  Hip external rotation 45 45  Knee flexion 135 135  Knee extension 0 0  Ankle dorsiflexion 20 20  Ankle plantarflexion 40 40  Ankle inversion 35 35  Ankle eversion 15 15   (Blank rows = not tested)       LE MMT:                                             MMT Right 01/30/2022 Left 01/30/2022  Hip flexion 5 5  Hip extension 5 5  Hip abduction 5 5  Hip adduction 5 5  Hip internal rotation 5 5  Hip external rotation 5 5  Knee flexion 5 5  Knee extension 5 5  Ankle dorsiflexion 5 5  Ankle plantarflexion 5 5  Ankle inversion 5 5  Ankle eversion 5 5   (Blank rows = not tested)          LOWER EXTREMITY SPECIAL TESTS:  Hip special tests: Saralyn Pilar (FABER) test: positive , FADIR negative            Knee special tests: McMurray's test: negative   FUNCTIONAL TESTS:  6 minute walk test: NT  Squat  No pain indicated        TODAY'S TREATMENT: 03/01/22              THEREX:  TM 2.5 mph for 5 min   Prone quad stretch 4 x 30 sec   Mini-Squats 3 x 10 -min VC maintain toes behind knees and shift weight back onto heels   Side lying hip abduction 3 x 10  -min VC to maintain neutral hip  MANUAL   Myofascial release of medial side of right knee and glute med of right hip   02/27/22   THEREX:   TM at 2.5 mph for 5 min  -Pt reports increased pain along medial joint line of right knee NPS 3/10   Meniscus Cluster: McMurry's - R  Thessley's Test  + R  Hyperflexion + R  Hyperextension - R  Apley's Test - R   Joint Line Tenderness along medial joint line of right knee  R Knee Extension  5   MANUAL  Soft tissue massage over right pes anserinus and right semimembranosus and semitendinosus tendons and varicose veins.  -  Noted muscular tension  -Pt reports improved pain response to walking after receiving massage   02/25/22  THEREX:  Nu-Step Resistance 3 with seat level 6  20mT: 1,450 ft- NPS 3/10  Pain focally over right pes anserinus   Seated Figure 4 Stretch 4 x 30 sec  Supine Right Hip Abduction to Stretch Right Hip Abductors   MANUAL:  Trigger point release of insertion of pes anserinus with some relief  -Pain not triggered with hip adduction  01/30/22 Sidelying IT band stretch 2 x 60 sec  -min VC for body placement on edge of mat       PATIENT EDUCATION:  Education details: form and technique for appropriate exercise. Explanation of OA and relationship to swelling and inflammation.  Person educated: Patient Education method: Explanation, Demonstration, Verbal cues, and Handouts Education comprehension: verbalized understanding, returned demonstration, verbal cues required, and needs further education     HOME EXERCISE PROGRAM: Access Code: [email or web address]: [email or web address] Date: 03/04/2022 Prepared by: DBradly Chris Exercises - Seated Hip External Rotation Stretch  - 1 x daily - 7 x weekly - 1 sets - 3 reps - 30 hold - Sidelying Hip Abduction at Wall  - 1 x daily - 3 x weekly - 3 sets - 10 reps - Mini Squat  - 1 x daily - 3 x weekly - 3 sets - 10 reps - Prone Quadriceps Stretch with Strap  - 1 x daily - 7 x weekly - 1 sets - 3 reps - 30 hold   ASSESSMENT:   CLINICAL IMPRESSION:  Pt continues to experience right medial knee pain with walking and other weight bearing activity that is relieved with soft tissue massage. She also exhibits trigger point in right hip located in glute med that are relieved with trigger point release. She was able to  complete all exercises without an increase in her pain and she was given hip and knee strengthening exercises to prevent ongoing weak and tight muscles that result in trigger points.       OBJECTIVE IMPAIRMENTS Abnormal gait, difficulty walking, increased edema, obesity, and pain.    ACTIVITY LIMITATIONS shopping and walking on vaction .    PERSONAL FACTORS Age and Time since onset of injury/illness/exacerbation are also affecting patient's functional outcome.      REHAB POTENTIAL: Fair time since onset and failed cortisone injections    CLINICAL DECISION MAKING: Stable/uncomplicated   EVALUATION COMPLEXITY: Low     GOALS: Goals reviewed with patient? No   SHORT TERM GOALS: Target date: 02/14/2022   Pt will be independent with HEP in order to improve strength and balance in order to decrease fall risk and improve function at home and work. Baseline: NT  Goal status: INITIAL         LONG TERM GOALS: Target date: 03/28/2022   Patient will have improved function and activity level as evidenced by an increase in FOTO score by 10 points or more.  Baseline: 63/74 Goal status: INITIAL   2.  Patient will decrease right knee swelling as symmetrical knee girth for increased function.  Baseline: R/L Knee Girth 16.5", 15.5"  Goal status: INITIAL   3.  Pt will increase 6MWT by at least [id number] order to demonstrate clinically significant improvement in cardiopulmonary endurance and community ambulation Baseline:  NT  Goal status: INITIAL         PLAN: PT FREQUENCY: 1-2x/week   PT DURATION: 8 weeks   PLANNED INTERVENTIONS: Therapeutic exercises, Therapeutic activity, Neuromuscular  re-education, Balance training, Gait training, Patient/Family education, Joint mobilization, Stair training, DME instructions, Aquatic Therapy, Dry Needling, Spinal manipulation, Spinal mobilization, Cryotherapy, Moist heat, Traction, and Manual therapy   PLAN FOR NEXT SESSION: Continued manual  therapy and pain response while walking quickly, prone hamstring MMT, self-MFR of right hip using tennis ball   Bradly Chris PT, DPT  03/04/2022, 3:53 PM

## 2022-03-06 ENCOUNTER — Ambulatory Visit: Payer: PPO | Admitting: Physical Therapy

## 2022-03-06 ENCOUNTER — Encounter: Payer: Self-pay | Admitting: Physical Therapy

## 2022-03-06 DIAGNOSIS — R262 Difficulty in walking, not elsewhere classified: Secondary | ICD-10-CM

## 2022-03-06 DIAGNOSIS — M170 Bilateral primary osteoarthritis of knee: Secondary | ICD-10-CM

## 2022-03-06 DIAGNOSIS — G8929 Other chronic pain: Secondary | ICD-10-CM

## 2022-03-06 DIAGNOSIS — M25561 Pain in right knee: Secondary | ICD-10-CM | POA: Diagnosis not present

## 2022-03-06 NOTE — Therapy (Signed)
OUTPATIENT PHYSICAL THERAPY TREATMENT NOTE   Patient Name: Glenda Burke MRN: [id number] DOB:May 04, 1951, 71 y.o., female Today's Date: 03/06/2022  PCP: Dr. Dianah Field  REFERRING PROVIDER: Dr. Dianah Field   END OF SESSION:   PT End of Session - 03/06/22 1338     Visit Number 5    Number of Visits 16    Date for PT Re-Evaluation 03/28/22    PT Start Time 1335    PT Stop Time 1415    PT Time Calculation (min) 40 min    Activity Tolerance Patient tolerated treatment well    Behavior During Therapy [facility] for tasks assessed/performed             Past Medical History:  Diagnosis Date   Allergy    Anemia    Arthritis    Cataract    Hyperlipidemia    Hypertension    Menopausal state    Past Surgical History:  Procedure Laterality Date   arm surgery Left    plate and screw   [place]OS[place]  [place]   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 Dianah Field   THERAPY DIAG:  Chronic pain of right knee  Difficulty in walking, not elsewhere classified  Primary osteoarthritis of both knees  PERTINENT HISTORY: Primary osteoarthritis of both knees Persistent right knee pain in spite of injection at the last visit. Adding some physical therapy as she is still  having significant pain and we will also get her approved for viscosupplementation.  PRECAUTIONS: None   SUBJECTIVE: Pt reports that her knee pain has remained about the same since last visit. She still feels slight discomfort while walking in the medial part of her right knee.    Pt reports that she is feeling better today even after walking for prolonged distances. She recently saw her PCP for eval and treatment of her right medial knee pain which he believes is meniscal and he will likely order MRI after she returns from vacation.    PAIN:  Are you having pain? No   OBJECTIVE: (objective measures completed at initial evaluation unless otherwise dated)  VITALS: BP 133/84 HR 80 Spo2 98   DIAGNOSTIC FINDINGS:    CLINICAL DATA:  Primary osteoarthritis of both knees.   EXAM: LEFT KNEE - COMPLETE 4+ VIEW; RIGHT KNEE - COMPLETE 4+ VIEW   COMPARISON:  Radiograph 06/02/2015   FINDINGS: Right knee: Moderate medial tibiofemoral joint space narrowing. There is lateral patellar tilt and lateral patellofemoral joint space narrowing, with slight progression from prior exam. Moderate to advanced tricompartmental peripheral osteophytes. No erosion, bone destruction, or focal osseous lesion. Small quadriceps tendon enthesophyte. No significant joint effusion.   Left knee: Minimal medial tibiofemoral joint space narrowing. There is lateral patellar tilt. Moderate to large tricompartmental osteophytes  most prominently affecting the patellofemoral compartment. Subchondral cystic change in the lateral proximal tibia is unchanged from previous. No erosion, bone destruction or focal lesion. No significant joint effusion. Small quadriceps tendon enthesophyte.   IMPRESSION: 1. Moderate tricompartmental osteoarthritis of both knees, patellofemoral predominant. Slight progression of right knee degenerative change from 2016 exam. 2. Lateral patellar tilt bilaterally, right greater than left.      Electronically Signed   By: Keith Rake M.D.   On: 05/10/2021 16:35   PATIENT SURVEYS:  FOTO 63/74   COGNITION:           Overall cognitive status: Within functional limits for tasks assessed                          SENSATION: WFL   MUSCLE LENGTH: Hamstrings: Right 90 deg; Left 90 deg Thomas test: Negative    POSTURE:  Normal, no abnormalities noted    PALPATION: Medial portion of right knee    GIRTH: R 16.5 inch, L 15.5 inch    LE ROM:   Active ROM Right 01/30/2022 Left 01/30/2022  Hip flexion 120 120  Hip extension 30 30  Hip abduction 45 45  Hip adduction 30 30  Hip internal rotation 45 45  Hip external rotation 45 45  Knee flexion 135 135  Knee extension 0 0  Ankle dorsiflexion 20 20  Ankle plantarflexion 40 40  Ankle inversion 35 35  Ankle eversion 15 15   (Blank rows = not tested)       LE MMT:                                             MMT Right 01/30/2022 Left 01/30/2022  Hip flexion 5 5  Hip extension 5 5  Hip abduction 5 5  Hip adduction 5 5  Hip internal rotation 5 5  Hip external rotation 5 5  Knee flexion 5 5  Knee extension 5 5  Ankle dorsiflexion 5 5  Ankle plantarflexion 5 5  Ankle inversion 5 5  Ankle eversion 5 5   (Blank rows = not tested)          LOWER EXTREMITY SPECIAL TESTS:  Hip special tests: Saralyn Pilar (FABER) test: positive , FADIR negative            Knee special tests: McMurray's test: negative   FUNCTIONAL TESTS:  6 minute walk test: NT  Squat  No pain indicated        TODAY'S TREATMENT: 03/06/22  THEREX   TM 2.5 mph for 6 min   Forward Lunges 3 x 10   Bridges with Marches 3 x 10  -min VC to maintain bridge while marching   Sidelying Hip Abduction with Yellow TB 3 x 10  -min VC to maintian neutral hip rotation  Sidelying Hip Adduction 3 x 10  -Educated pt on crossing leg and uncrossing leg   MANUAL  Soft tissue mobilization of medial side of right knee  03/01/22               THEREX:  TM 2.5 mph for 5 min   Prone quad stretch 4 x 30 sec   Mini-Squats 3 x 10 -min VC maintain toes behind knees and shift weight back onto heels   Side lying hip abduction 3 x 10  -min VC  to maintain neutral hip  MANUAL   Myofascial release of medial side of right knee and glute med of right hip   02/27/22   THEREX:   TM at 2.5 mph for 5 min  -Pt reports increased pain along medial joint line of right knee NPS 3/10   Meniscus Cluster: McMurry's - R  Thessley's Test  + R  Hyperflexion + R  Hyperextension - R  Apley's Test - R  Joint Line Tenderness along medial joint line of right knee  R Knee Extension  5   MANUAL  Soft tissue massage over right pes anserinus and right semimembranosus and semitendinosus tendons and varicose veins.  -Noted muscular tension  -Pt reports improved pain response to walking after receiving massage   02/25/22  THEREX:  Nu-Step Resistance 3 with seat level 6  61mT: 1,450 ft- NPS 3/10  Pain focally over right pes anserinus   Seated Figure 4 Stretch 4 x 30 sec  Supine Right Hip Abduction to Stretch Right Hip Abductors   MANUAL:  Trigger point release of insertion of pes anserinus with some relief  -Pain not triggered with hip adduction         PATIENT EDUCATION:  Education details: form and technique for appropriate exercise. Explanation of OA and relationship to swelling and inflammation.  Person educated: Patient Education method: Explanation, Demonstration, Verbal cues, and Handouts Education comprehension: verbalized understanding, returned demonstration, verbal cues required, and needs further education     HOME EXERCISE PROGRAM: Access Code: [email or web address]: [email or web address] Date: 03/06/2022 Prepared by: DBradly Chris Exercises - Seated Hip External Rotation Stretch  - 1 x daily - 7 x weekly - 1 sets - 3 reps - 30 hold - Prone Quadriceps Stretch with Strap  - 1 x daily - 7 x weekly - 1  sets - 3 reps - 30 hold - Shoulder Bridge Prep with Continuous Marching  - 1 x daily - 3 x weekly - 3 sets - 10 reps - Mini Lunge  - 1 x daily - 3 x weekly - 3 sets - 10 reps - Sidelying Hip Abduction  - 1 x daily - 3 x weekly - 3 sets - 10 reps - Sidelying Hip Adduction  - 1 x daily - 3 x weekly - 3 sets - 10 reps   ASSESSMENT:   CLINICAL IMPRESSION: Pt able to tolerate all exercises without an increase in her right knee pain with the exception of walking on TM. HEP modified to include progressions of non-weight bearing exercises so that pt can perform in case that her knee pain worsens while she is on her vacation where she will be walking throughout the day. Soft tissue mobilization continues to offer patient short term pain relief but does not result in long lasting relief.       OBJECTIVE IMPAIRMENTS Abnormal gait, difficulty walking, increased edema, obesity, and pain.    ACTIVITY LIMITATIONS shopping and walking on vaction .    PERSONAL FACTORS Age and Time since onset of injury/illness/exacerbation are also affecting patient's functional outcome.      REHAB POTENTIAL: Fair time since onset and failed cortisone injections    CLINICAL DECISION MAKING: Stable/uncomplicated   EVALUATION COMPLEXITY: Low     GOALS: Goals reviewed with patient? No   SHORT TERM GOALS: Target date: 02/14/2022   Pt will be independent with HEP in order to improve strength and balance in order to decrease fall risk and improve function at home and work. Baseline: NT  Goal status: INITIAL         LONG TERM GOALS: Target date: 03/28/2022   Patient will have improved function and activity level as evidenced by an increase in FOTO score by 10 points or more.  Baseline: 63/74 Goal status: INITIAL   2.  Patient will decrease right knee swelling as symmetrical knee girth for increased function.  Baseline: R/L Knee Girth 16.5", 15.5"  Goal status: INITIAL   3.  Pt will increase 6MWT by at least  47m(1672f in order to demonstrate clinically significant improvement in cardiopulmonary endurance and community ambulation Baseline:  NT  Goal status: INITIAL         PLAN: PT FREQUENCY: 1-2x/week   PT DURATION: 8 weeks   PLANNED INTERVENTIONS: Therapeutic exercises, Therapeutic activity, Neuromuscular re-education, Balance training, Gait training, Patient/Family education, Joint mobilization, Stair training, DME instructions, Aquatic Therapy, Dry Needling, Spinal manipulation, Spinal mobilization, Cryotherapy, Moist heat, Traction, and Manual therapy   PLAN FOR NEXT SESSION: 6 mWT, Continued manual therapy and pain response while walking quickly, prone hamstring MMT, self-MFR of right hip using tennis ball, progress hip and knee strengthening exercises    DaBradly ChrisT, DPT  03/06/2022, 2:28 PM

## 2022-03-12 ENCOUNTER — Encounter: Payer: PPO | Admitting: Physical Therapy

## 2022-03-14 ENCOUNTER — Encounter: Payer: PPO | Admitting: Physical Therapy

## 2022-03-18 ENCOUNTER — Encounter: Payer: PPO | Admitting: Physical Therapy

## 2022-03-20 ENCOUNTER — Encounter: Payer: PPO | Admitting: Physical Therapy

## 2022-03-25 ENCOUNTER — Encounter: Payer: PPO | Admitting: Physical Therapy

## 2022-03-27 ENCOUNTER — Encounter: Payer: PPO | Admitting: Physical Therapy

## 2022-04-01 ENCOUNTER — Encounter: Payer: PPO | Admitting: Physical Therapy

## 2022-04-03 ENCOUNTER — Encounter: Payer: PPO | Admitting: Physical Therapy

## 2022-04-08 ENCOUNTER — Encounter: Payer: Self-pay | Admitting: Sports Medicine

## 2022-04-08 ENCOUNTER — Ambulatory Visit (INDEPENDENT_AMBULATORY_CARE_PROVIDER_SITE_OTHER): Payer: PPO | Admitting: Sports Medicine

## 2022-04-08 ENCOUNTER — Encounter: Payer: PPO | Admitting: Physical Therapy

## 2022-04-08 VITALS — BP 130/79 | HR 89 | Ht 62.0 in | Wt 184.0 lb

## 2022-04-08 DIAGNOSIS — S83241D Other tear of medial meniscus, current injury, right knee, subsequent encounter: Secondary | ICD-10-CM

## 2022-04-08 DIAGNOSIS — M170 Bilateral primary osteoarthritis of knee: Secondary | ICD-10-CM | POA: Diagnosis not present

## 2022-04-08 DIAGNOSIS — I10 Essential (primary) hypertension: Secondary | ICD-10-CM

## 2022-04-08 NOTE — Assessment & Plan Note (Signed)
 Pleasant 71 year old female, bilateral knee osteoarthritis, she had an injection back in March, continue to have pain so we were getting her approved for viscosupplementation which is approved. She has done physical therapy, she has had x-rays that show arthritis. For insurance company purposes she has medial joint line pain with locking, catching, popping, positive McMurray's sign with swelling. Proceeding with MRI, if she has mostly osteoarthritis we will proceed with viscosupplementation, if she does have significant meniscal tearing we will refer for arthroscopy.

## 2022-04-08 NOTE — Assessment & Plan Note (Signed)
Glenda Burke also has trace lower extremity edema, bilateral and symmetric negative Homans signs. Normal renal function, normal hepatic function, no symptoms or signs of heart failure. She does note worsening edema when on her feet for an extended period of time, also worse with sodium intake, she will decrease sodium intake, elevate feet when possible, and consider lower extremity compression hose, we did discuss the anatomy and pathophysiology, return as needed for this.

## 2022-04-08 NOTE — Progress Notes (Signed)
    Procedures performed today:    None.  Independent interpretation of notes and tests performed by another provider:   None.  Brief History, Exam, Impression, and Recommendations:    Primary osteoarthritis of both knees Pleasant 71 year old female, bilateral knee osteoarthritis, she had an injection back in March, continue to have pain so we were getting her approved for viscosupplementation which is approved. She has done physical therapy, she has had x-rays that show arthritis. For insurance company purposes she has medial joint line pain with locking, catching, popping, positive McMurray's sign with swelling. Proceeding with MRI, if she has mostly osteoarthritis we will proceed with viscosupplementation, if she does have significant meniscal tearing we will refer for arthroscopy.  Essential hypertension Glenda Burke also has trace lower extremity edema, bilateral and symmetric negative Homans signs. Normal renal function, normal hepatic function, no symptoms or signs of heart failure. She does note worsening edema when on her feet for an extended period of time, also worse with sodium intake, she will decrease sodium intake, elevate feet when possible, and consider lower extremity compression hose, we did discuss the anatomy and pathophysiology, return as needed for this.    ___________________________________________ Glenda Burke, M.D., ABFM., CAQSM. Primary Care and Sports Medicine [place] [facility]  Adjunct Instructor of Family Medicine  [employer]

## 2022-04-10 ENCOUNTER — Encounter: Payer: PPO | Admitting: Physical Therapy

## 2022-04-10 ENCOUNTER — Telehealth: Payer: Self-pay

## 2022-04-10 NOTE — Telephone Encounter (Signed)
Patient called to inquire about her MRI that was ordered on Monday. Please check the status and let patient know.

## 2022-04-10 NOTE — Telephone Encounter (Signed)
Patient advised the imaging will call soon.

## 2022-04-15 ENCOUNTER — Ambulatory Visit (INDEPENDENT_AMBULATORY_CARE_PROVIDER_SITE_OTHER): Payer: PPO

## 2022-04-15 DIAGNOSIS — M25561 Pain in right knee: Secondary | ICD-10-CM | POA: Diagnosis not present

## 2022-04-15 DIAGNOSIS — S83241D Other tear of medial meniscus, current injury, right knee, subsequent encounter: Secondary | ICD-10-CM | POA: Diagnosis not present

## 2022-04-17 ENCOUNTER — Telehealth: Payer: Self-pay | Admitting: Sports Medicine

## 2022-04-17 NOTE — Telephone Encounter (Signed)
[employer] approval please, right knee osteoarthritis, failed injections, analgesics, therapy.

## 2022-04-18 ENCOUNTER — Other Ambulatory Visit: Payer: Self-pay | Admitting: Sports Medicine

## 2022-04-18 DIAGNOSIS — M170 Bilateral primary osteoarthritis of knee: Secondary | ICD-10-CM

## 2022-04-18 NOTE — Telephone Encounter (Signed)
[employer] paperwork faxed to [employer] at [phone] Request is for Orthovisc Pt's insurance prefers Orthovisc Fax confirmation receipt received  

## 2022-04-23 NOTE — Telephone Encounter (Signed)
Benefits Investigation Details received from [employer] Injection: Orthovisc  Medical: Deductible does not apply. Once the OOP has been met, patient is covered at 100%/. Only one copy applies per date of service.  PA required: No Pharmacy: Product is not covered under the pharmacy plan  Specialty Pharmacy: Rx Benefits  May fill through: [place] Copay/Coinsurance:  Product Copay: 20% ($140) Administration Coinsurance:  Administration Copay: $25 Deductible:  Out of Pocket Max: $3200 (met: $116.62)    Left msg for a return call from patient regarding gel injections.

## 2022-04-24 ENCOUNTER — Encounter: Payer: Self-pay | Admitting: Sports Medicine

## 2022-04-24 ENCOUNTER — Ambulatory Visit (INDEPENDENT_AMBULATORY_CARE_PROVIDER_SITE_OTHER): Payer: PPO | Admitting: Sports Medicine

## 2022-04-24 DIAGNOSIS — M170 Bilateral primary osteoarthritis of knee: Secondary | ICD-10-CM | POA: Diagnosis not present

## 2022-04-24 NOTE — Telephone Encounter (Signed)
Spoke with patient during an OV regarding her cost for the orthovisc injections. She stated that she would be willing to pay for the injections but the plan is for her to have a knee scope first prior to visco treatment.

## 2022-04-24 NOTE — Progress Notes (Signed)
    Procedures performed today:    None.  Independent interpretation of notes and tests performed by another provider:   None.  Brief History, Exam, Impression, and Recommendations:    Primary osteoarthritis of both knees Pleasant 71 year old female, bilateral knee osteoarthritis, had an injection in March without significant improvement so we worked on [place], she had significant popping, catching so we did obtain MRI that did show significant meniscal tearing. I would like her to discuss arthroscopic debridement of the meniscal tear before we start viscosupplementation. Of note she is approved for Orthovisc and is agreeable to pay the out-of-pocket cost. We can start the injections after she either sees Ortho and is not a candidate for scope or his postarthroscopy.    ____________________________________________ Gwen Her. Dianah Field, M.D., ABFM., CAQSM., AME. Primary Care and Sports Medicine [place] [facility]  Adjunct Professor of [place] of [facility] of Medicine  Risk manager

## 2022-04-24 NOTE — Assessment & Plan Note (Signed)
Pleasant 71 year old female, bilateral knee osteoarthritis, had an injection in March without significant improvement so we worked on [place], she had significant popping, catching so we did obtain MRI that did show significant meniscal tearing. I would like her to discuss arthroscopic debridement of the meniscal tear before we start viscosupplementation. Of note she is approved for Orthovisc and is agreeable to pay the out-of-pocket cost. We can start the injections after she either sees Ortho and is not a candidate for scope or his postarthroscopy.

## 2022-04-29 DIAGNOSIS — M170 Bilateral primary osteoarthritis of knee: Secondary | ICD-10-CM | POA: Diagnosis not present

## 2022-05-09 ENCOUNTER — Ambulatory Visit: Payer: PPO | Admitting: Sports Medicine

## 2022-05-21 ENCOUNTER — Ambulatory Visit (INDEPENDENT_AMBULATORY_CARE_PROVIDER_SITE_OTHER): Payer: PPO

## 2022-05-21 ENCOUNTER — Ambulatory Visit: Payer: PPO | Admitting: Sports Medicine

## 2022-05-21 DIAGNOSIS — M170 Bilateral primary osteoarthritis of knee: Secondary | ICD-10-CM

## 2022-05-21 MED ORDER — MELOXICAM 15 MG PO TABS: ORAL_TABLET | ORAL | 3 refills | Status: DC

## 2022-05-21 NOTE — Progress Notes (Signed)
    Procedures performed today:    Procedure: Real-time Ultrasound Guided aspiration/injection of the right knee Device: Samsung HS60  Verbal informed consent obtained.  Time-out conducted.  Noted no overlying erythema, induration, or other signs of local infection.  Skin prepped in a sterile fashion.  Local anesthesia: Topical Ethyl chloride.  With sterile technique and under real time ultrasound guidance: Mild effusion noted, aspirated 20 mils of clear, straw-colored fluid, syringe switched and, 30 mg/2 mL of OrthoVisc (sodium hyaluronate) in a prefilled syringe was injected easily into the knee through an 18-gauge needle. Completed without difficulty  Advised to call if fevers/chills, erythema, induration, drainage, or persistent bleeding.  Images permanently stored and available for review in PACS.  Impression: Technically successful ultrasound guided aspiration/injection.  Independent interpretation of notes and tests performed by another provider:   None.  Brief History, Exam, Impression, and Recommendations:    Primary osteoarthritis of both knees Pleasant 71 year old female returns, bilateral knee osteoarthritis, steroid injections ineffective, MRI did show significant meniscal tearing, she did discuss this with orthopedic surgery who had suggested to proceed with viscosupplementation first before considering arthroscopy versus arthroplasty. Today we did Orthovisc injection #104, return in 1 week for #2 of 4 right knee.    ____________________________________________ Gwen Her. Dianah Field, M.D., ABFM., CAQSM., AME. Primary Care and Sports Medicine [place] [facility]  Adjunct Professor of [place] [employer] [employer] of Medicine  Risk manager

## 2022-05-21 NOTE — Assessment & Plan Note (Signed)
Pleasant 71 year old female returns, bilateral knee osteoarthritis, steroid injections ineffective, MRI did show significant meniscal tearing, she did discuss this with orthopedic surgery who had suggested to proceed with viscosupplementation first before considering arthroscopy versus arthroplasty. Today we did Orthovisc injection #104, return in 1 week for #2 of 4 right knee.

## 2022-05-28 ENCOUNTER — Ambulatory Visit (INDEPENDENT_AMBULATORY_CARE_PROVIDER_SITE_OTHER): Payer: PPO

## 2022-05-28 ENCOUNTER — Ambulatory Visit: Payer: PPO | Admitting: Sports Medicine

## 2022-05-28 DIAGNOSIS — M170 Bilateral primary osteoarthritis of knee: Secondary | ICD-10-CM

## 2022-05-28 NOTE — Progress Notes (Signed)
    Procedures performed today:    Procedure: Real-time Ultrasound Guided aspiration/injection of the right knee Device: Samsung HS60  Verbal informed consent obtained.  Time-out conducted.  Noted no overlying erythema, induration, or other signs of local infection.  Skin prepped in a sterile fashion.  Local anesthesia: Topical Ethyl chloride.  With sterile technique and under real time ultrasound guidance: Mild effusion noted, aspirated 183mls of clear, straw-colored fluid, syringe switched and, 30 mg/2 mL of OrthoVisc (sodium hyaluronate) in a prefilled syringe was injected easily into the knee through an 18-gauge needle. Completed without difficulty  Advised to call if fevers/chills, erythema, induration, drainage, or persistent bleeding.  Images permanently stored and available for review in PACS.  Impression: Technically successful ultrasound guided aspiration/injection.  Independent interpretation of notes and tests performed by another provider:   None.  Brief History, Exam, Impression, and Recommendations:    Primary osteoarthritis of both knees Aspiration and Orthovisc injection #2 of 4 right knee. Return in 1 week for #3 of 4 right knee, of note I would recommend using a spinal needle for future injections due to body habitus. Of note her orthopedic surgeon had suggested to proceed with viscosupplementation before proceeding with arthroscopy versus arthroplasty.    ____________________________________________ TGwen Her TDianah Field M.D., ABFM., CAQSM., AME. Primary Care and Sports Medicine [place] [facility] Adjunct Professor of [employer] [facility] [employer]  FRisk manager

## 2022-05-28 NOTE — Assessment & Plan Note (Addendum)
Aspiration and Orthovisc injection #2 of 4 right knee. Return in 1 week for #3 of 4 right knee, of note I would recommend using a spinal needle for future injections due to body habitus. Of note her orthopedic surgeon had suggested to proceed with viscosupplementation before proceeding with arthroscopy versus arthroplasty.

## 2022-06-04 ENCOUNTER — Ambulatory Visit: Payer: PPO | Admitting: Sports Medicine

## 2022-06-04 ENCOUNTER — Ambulatory Visit (INDEPENDENT_AMBULATORY_CARE_PROVIDER_SITE_OTHER): Payer: PPO

## 2022-06-04 DIAGNOSIS — M170 Bilateral primary osteoarthritis of knee: Secondary | ICD-10-CM

## 2022-06-04 NOTE — Assessment & Plan Note (Signed)
Orthovisc injection 3 of 4 right knee with a spinal needle due to habitus. Return to see me in 1 week for #4 of 4.

## 2022-06-04 NOTE — Progress Notes (Signed)
    Procedures performed today:    Procedure: Real-time Ultrasound Guided injection of the right knee Device: Samsung HS60  Verbal informed consent obtained.  Time-out conducted.  Noted no overlying erythema, induration, or other signs of local infection.  Skin prepped in a sterile fashion.  Local anesthesia: Topical Ethyl chloride.  With sterile technique and under real time ultrasound guidance: Trace effusion noted, 30 mg/2 mL of OrthoVisc (sodium hyaluronate) in a prefilled syringe was injected easily into the knee through a 22-gauge spinal needle, we then flushed the needle with 1 mL of lidocaine 1% no epi. Completed without difficulty  Advised to call if fevers/chills, erythema, induration, drainage, or persistent bleeding.  Images permanently stored and available for review in PACS.  Impression: Technically successful ultrasound guided injection.  Independent interpretation of notes and tests performed by another provider:   None.  Brief History, Exam, Impression, and Recommendations:    Primary osteoarthritis of both knees Orthovisc injection 3 of 4 right knee with a spinal needle due to habitus. Return to see me in 1 week for #4 of 4.    ____________________________________________ Gwen Her. Dianah Field, M.D., ABFM., CAQSM., AME. Primary Care and Sports Medicine [place] [facility]  Adjunct Professor of [place] of [facility] of Medicine  Risk manager

## 2022-06-05 ENCOUNTER — Encounter: Payer: Self-pay | Admitting: General Practice

## 2022-06-11 ENCOUNTER — Ambulatory Visit (INDEPENDENT_AMBULATORY_CARE_PROVIDER_SITE_OTHER): Payer: PPO | Admitting: Sports Medicine

## 2022-06-11 ENCOUNTER — Ambulatory Visit: Payer: Self-pay

## 2022-06-11 DIAGNOSIS — M170 Bilateral primary osteoarthritis of knee: Secondary | ICD-10-CM

## 2022-06-11 NOTE — Progress Notes (Signed)
    Procedures performed today:    Procedure: Real-time Ultrasound Guided injection of the right knee Device: Samsung HS60  Verbal informed consent obtained.  Time-out conducted.  Noted no overlying erythema, induration, or other signs of local infection.  Skin prepped in a sterile fashion.  Local anesthesia: Topical Ethyl chloride.  With sterile technique and under real time ultrasound guidance: Trace effusion noted, 30 mg/2 mL of OrthoVisc (sodium hyaluronate) in a prefilled syringe was injected easily into the knee through a 22-gauge spinal needle, we then flushed the needle with 1 mL of lidocaine 1% no epi. Completed without difficulty  Advised to call if fevers/chills, erythema, induration, drainage, or persistent bleeding.  Images permanently stored and available for review in PACS.  Impression: Technically successful ultrasound guided injection.  Independent interpretation of notes and tests performed by another provider:   None.  Brief History, Exam, Impression, and Recommendations:    Primary osteoarthritis of both knees Orthovisc 4 of 4 right knee, return as needed.    ____________________________________________ Gwen Her. Dianah Field, M.D., ABFM., CAQSM., AME. Primary Care and Sports Medicine [place] [facility]  Adjunct Professor of [place] [employer] [facility] of Medicine  Risk manager

## 2022-06-11 NOTE — Assessment & Plan Note (Signed)
Orthovisc 4 of 4 right knee, return as needed. 

## 2022-11-11 ENCOUNTER — Telehealth: Payer: Self-pay | Admitting: Sports Medicine

## 2022-11-11 NOTE — Telephone Encounter (Signed)
Left voicemail for patient to schedule annual wellness visit. Glenda Burke

## 2023-01-29 DIAGNOSIS — H40013 Open angle with borderline findings, low risk, bilateral: Secondary | ICD-10-CM | POA: Diagnosis not present

## 2023-02-03 ENCOUNTER — Telehealth: Payer: Self-pay | Admitting: Sports Medicine

## 2023-02-03 ENCOUNTER — Ambulatory Visit (INDEPENDENT_AMBULATORY_CARE_PROVIDER_SITE_OTHER): Payer: PPO | Admitting: Sports Medicine

## 2023-02-03 VITALS — BP 127/84

## 2023-02-03 DIAGNOSIS — M170 Bilateral primary osteoarthritis of knee: Secondary | ICD-10-CM | POA: Diagnosis not present

## 2023-02-03 DIAGNOSIS — N289 Disorder of kidney and ureter, unspecified: Secondary | ICD-10-CM | POA: Diagnosis not present

## 2023-02-03 DIAGNOSIS — R7303 Prediabetes: Secondary | ICD-10-CM

## 2023-02-03 DIAGNOSIS — Z7984 Long term (current) use of oral hypoglycemic drugs: Secondary | ICD-10-CM

## 2023-02-03 DIAGNOSIS — I10 Essential (primary) hypertension: Secondary | ICD-10-CM | POA: Diagnosis not present

## 2023-02-03 DIAGNOSIS — Z13820 Encounter for screening for osteoporosis: Secondary | ICD-10-CM

## 2023-02-03 DIAGNOSIS — Z0000 Encounter for general adult medical examination without abnormal findings: Secondary | ICD-10-CM

## 2023-02-03 DIAGNOSIS — E6609 Other obesity due to excess calories: Secondary | ICD-10-CM

## 2023-02-03 DIAGNOSIS — E66812 Obesity, class 2: Secondary | ICD-10-CM

## 2023-02-03 DIAGNOSIS — E119 Type 2 diabetes mellitus without complications: Secondary | ICD-10-CM | POA: Diagnosis not present

## 2023-02-03 LAB — COMPREHENSIVE METABOLIC PANEL: eGFR: 33

## 2023-02-03 MED ORDER — SHINGRIX 50 MCG/0.5ML IM SUSR: 0.5000 mL | Freq: Once | INTRAMUSCULAR | 0 refills | Status: AC

## 2023-02-03 NOTE — Assessment & Plan Note (Addendum)
Glenda Burke does have a long history of bilateral knee pain, she has had multiple interventions including physical therapy, steroid injections, viscosupplementation, she takes Tylenol 3 times daily. Did well with Orthovisc back in August 2023, we will get her approved again for Orthovisc. We will also get her some physical therapy, she has developed a Trendelenburg type gait and needs hip abductor strengthening as well as knee conditioning. She would also like to discuss additional interventions with pain management, she can see Dr. Cherylann Ratel at [facility] and discuss genicular radiofrequency ablation. She also has some questions about PRP, we can do this but I would like her to fail the more proven and insurance covered modalities first.

## 2023-02-03 NOTE — Assessment & Plan Note (Signed)
Medicare physical as above due for mammogram, ordering now, adding Prevnar 20. She is updated on Tdap, sending Shingrix to her pharmacy.

## 2023-02-03 NOTE — Telephone Encounter (Signed)
Orthovisc/[facility] approval please, bilateral XRAY confirmed OA, failed other measures. And > 6 weeks PT

## 2023-02-03 NOTE — Progress Notes (Signed)
Subjective:   Glenda Burke is a 72 y.o. female who presents for Medicare Annual (Subsequent) preventive examination.  Review of Systems    A comprehensive review of systems was negative     Objective:    Today's Vitals   02/03/23 1045  BP: 127/84   There is no height or weight on file to calculate BMI.     01/30/2022    1:51 PM 12/12/2015   11:38 AM 12/07/2014    9:49 AM  Advanced Directives  Does Patient Have a Medical Advance Directive? Yes Yes Yes  Type of Advance Directive Living will    Does patient want to make changes to medical advance directive? Yes (MAU/Ambulatory/Procedural Areas - Information given)  No - Patient declined  Copy of Healthcare Power of Attorney in Chart?  No - copy requested     Current Medications (verified) Outpatient Encounter Medications as of 02/03/2023  Medication Sig   acetaminophen (TYLENOL) 650 MG CR tablet 1 tablet p.o. 2 times to 3 times a day.   aspirin 81 MG tablet Take 81 mg by mouth 3 (three) times a week.   atorvastatin (LIPITOR) 10 MG tablet Take 1 tablet (10 mg total) by mouth daily at 6 PM.   calcipotriene (DOVONOX) 0.005 % cream Apply topically 2 (two) times daily.   Calcium Carbonate-Vit D-Min (CALCIUM 1200 PO) Take by mouth daily.   clobetasol ointment (TEMOVATE) 0.05 % Apply 1 application. topically 2 (two) times daily.   Diclofenac Sodium 2 % SOLN Place 2 sprays onto the skin 2 (two) times daily.   estradiol (ESTRACE VAGINAL) 0.1 MG/GM vaginal cream Place 1 Applicatorful vaginally 3 (three) times a week.   losartan-hydrochlorothiazide (HYZAAR) 100-25 MG tablet Take 1 tablet by mouth daily.   meloxicam (MOBIC) 15 MG tablet One tab PO every 24 hours with a meal for 2 weeks, then once every 24 hours prn pain.   Minoxidil 5 % FOAM Apply 1 application topically 2 (two) times daily. Apply twice daily for up to 4 months   [EXPIRED] Zoster Vaccine Adjuvanted [facility]) injection Inject 0.5 mLs into the muscle once for 1 dose.  Administer at 0 and 2-6 months for 2 total doses.  To be administered by pharmacy.   No facility-administered encounter medications on file as of 02/03/2023.    Allergies (verified) Hydrocodone   History: Past Medical History:  Diagnosis Date   Allergy    Anemia    Arthritis    Cataract    Hyperlipidemia    Hypertension    Menopausal state    Past Surgical History:  Procedure Laterality Date   arm surgery Left    plate and screw   COLONOSCOPY  ?   out of state   DILATION AND CURETTAGE OF UTERUS     polypectomy     Family History  Problem Relation Age of Onset   Hypertension Mother    Hyperlipidemia Mother    Congestive Heart Failure Mother    Stroke Father    Diabetes Father    Hyperlipidemia Father    Hypertension Father    Hypertension Sister    Hyperlipidemia Sister    Diabetes Sister    Kidney disease Sister        on HD   Kidney disease Brother        kidney transplant    Hypertension Brother    Hyperlipidemia Brother    Diabetes Brother    Colon cancer Neg Hx    Stomach cancer  Neg Hx    Breast cancer Neg Hx    Esophageal cancer Neg Hx    Rectal cancer Neg Hx    Social History   Socioeconomic History   Marital status: Married    Spouse name: Not on file   Number of children: Not on file   Years of education: Not on file   Highest education level: Not on file  Occupational History   Occupation: retired  Tobacco Use   Smoking status: Never   Smokeless tobacco: Never  Vaping Use   Vaping Use: Never used  Substance and Sexual Activity   Alcohol use: Yes    Comment: wine occasional   Drug use: No   Sexual activity: Yes    Partners: Male  Other Topics Concern   Not on file  Social History Narrative   Retired [employer]    Married    From [place]    Likes to travel    Social Determinants of Corporate investment banker Strain: Not on Ship broker Insecurity: Not on file  Transportation Needs: Not on file  Physical Activity: Not on  file  Stress: Not on file  Social Connections: Not on file    Tobacco Counseling Counseling given: Not Answered   Activities of Daily Living; able to accomplish all ADLs and IADLs  Patient Care Team: Monica Becton, MD as PCP - General (Sports Medicine)  Indicate any recent Medical Services you may have received from other than [facility] providers in the past year (date may be approximate).     Assessment:   This is a routine wellness examination for Glenda Burke.  Hearing/Vision screen No results found.  Dietary issues and exercise activities discussed:     Goals Addressed   None   Depression Screen    02/03/2023   10:16 AM 01/23/2022   10:26 AM 12/26/2021   10:07 AM 10/25/2020    9:32 AM 10/25/2020    9:30 AM 04/24/2018   10:13 AM 01/16/2018   10:40 AM  PHQ 2/9 Scores  PHQ - 2 Score 0 3 0 2 2 0 0  PHQ- 9 Score  6         Fall Risk    02/03/2023   10:16 AM 10/24/2021   10:02 AM 10/25/2020    9:32 AM 10/25/2020    9:30 AM 04/24/2018   10:13 AM  Fall Risk   Falls in the past year? 0 0 1 1 No  Number falls in past yr: 0 0 1 1   Injury with Fall? 0 0 0 0   Follow up Falls evaluation completed        FALL RISK PREVENTION PERTAINING TO THE HOME:  Any stairs in or around the home? Yes  If so, are there any without handrails? No  Home free of loose throw rugs in walkways, pet beds, electrical cords, etc? Yes  Adequate lighting in your home to reduce risk of falls? Yes   ASSISTIVE DEVICES UTILIZED TO PREVENT FALLS:  Life alert? No  Use of a cane, walker or w/c? No  Grab bars in the bathroom? Yes  Shower chair or bench in shower? No  Elevated toilet seat or a handicapped toilet? No   TIMED UP AND GO:  Was the test performed? No .  Not needed  Gait slow and steady without use of assistive device  Cognitive Function:        Immunizations Immunization History  Administered Date(s) Administered   [employer]  Quad(high Dose 65+) 08/22/2020   Influenza, High Dose  Seasonal PF 09/16/2018   Influenza,inj,Quad PF,6+ Mos 08/29/2015, 11/07/2019   PFIZER(Purple Top)SARS-COV-2 Vaccination 12/06/2019, 12/28/2019   PNEUMOCOCCAL CONJUGATE-20 02/03/2023   Pneumococcal Polysaccharide-23 03/30/2019   Tdap 08/28/2016   Zoster, Live 08/28/2016    TDAP status: Up to date  Flu Vaccine status: Up to date  Pneumococcal vaccine status: Completed during today's visit.  Covid-19 vaccine status: Completed vaccines  Qualifies for Shingles Vaccine? Yes   Zostavax completed No   Shingrix Completed?: Yes (sent to pharmacy)  Screening Tests Health Maintenance  Topic Date Due   FOOT EXAM  Never done   OPHTHALMOLOGY EXAM  Never done   Diabetic kidney evaluation - Urine ACR  Never done   Zoster Vaccines- Shingrix (1 of 2) Never done   MAMMOGRAM  10/25/2022   INFLUENZA VACCINE  05/15/2023   HEMOGLOBIN A1C  08/05/2023   Diabetic kidney evaluation - eGFR measurement  02/03/2024   Medicare Annual Wellness (AWV)  02/03/2024   COLONOSCOPY (Pts 45-81yrs Insurance coverage will need to be confirmed)  07/08/2026   DTaP/Tdap/Td (2 - Td or Tdap) 08/28/2026   Pneumonia Vaccine 51+ Years old  Completed   DEXA SCAN  Completed   Hepatitis C Screening  Completed   HPV VACCINES  Aged Out   COVID-19 Vaccine  Discontinued    Health Maintenance  Health Maintenance Due  Topic Date Due   FOOT EXAM  Never done   OPHTHALMOLOGY EXAM  Never done   Diabetic kidney evaluation - Urine ACR  Never done   Zoster Vaccines- Shingrix (1 of 2) Never done   MAMMOGRAM  10/25/2022    Colorectal cancer screening: No longer required.   Mammogram status: Ordered today. Pt provided with contact info and advised to call to schedule appt.   Bone Density status: Ordered today. Pt provided with contact info and advised to call to schedule appt.  Lung Cancer Screening: (Low Dose CT Chest recommended if Age 29-80 years, 30 pack-year currently smoking OR have quit w/in 15years.) does not qualify.    Additional Screening:  Vision Screening: Recommended annual ophthalmology exams for early detection of glaucoma and other disorders of the eye. Is the patient up to date with their annual eye exam?  Yes  Who is the provider or what is the name of the office in which the patient attends annual eye exams? Provider in [place] If pt is not established with a provider, would they like to be referred to a provider to establish care? No .   Dental Screening: Recommended annual dental exams for proper oral hygiene  Community Resource Referral / Chronic Care Management: CRR required this visit?  No   CCM required this visit?  No      Plan:    Annual physical exam Medicare physical as above due for mammogram, ordering now, adding Prevnar 20. She is updated on Tdap, sending Shingrix to her pharmacy.   Obesity Having trouble losing weight, setting her up again with the nutritionist this time at [facility]. If insufficient improvement we will consider adding a prescription for GLP-1.  Primary osteoarthritis of both knees Faizah does have a long history of bilateral knee pain, she has had multiple interventions including physical therapy, steroid injections, viscosupplementation, she takes Tylenol 3 times daily. Did well with Orthovisc back in August 2023, we will get her approved again for Orthovisc. We will also get her some physical therapy, she has developed a Trendelenburg type gait and  needs hip abductor strengthening as well as knee conditioning. She would also like to discuss additional interventions with pain management, she can see [facility]ann [facility]el at [facility] and discuss genicular radiofrequency ablation. She also has some questions about PRP, we can do this but I would like her to fail the more proven and insurance covered modalities first.  Diabetes mellitus type 2, diet-controlled Hemoglobin A1c is now in the diabetic range, I would like a virtual visit with her to  discuss additional screenings and treatments that she may need.  Gradual insulin resistance and glucose intolerance is a normal part of aging.  Renal function is also slightly abnormal now, I would like her to hydrate aggressively and then we should recheck this and may be a couple of weeks or a month.  Renal insufficiency Newly diagnosed insufficiency with a GFR 33, I would like her to hydrate aggressively and then we can recheck to see if this improves.   I have personally reviewed and noted the following in the patient's chart:   Medical and social history Use of alcohol, tobacco or illicit drugs  Current medications and supplements including opioid prescriptions. Patient is not currently taking opioid prescriptions. Functional ability and status Nutritional status Physical activity Advanced directives List of other physicians Hospitalizations, surgeries, and ER visits in previous 12 months Vitals Screenings to include cognitive, depression, and falls Referrals and appointments  In addition, I have reviewed and discussed with patient certain preventive protocols, quality metrics, and best practice recommendations. A written personalized care plan for preventive services as well as general preventive health recommendations were provided to patient.     Rodney Langton, MD   02/04/2023

## 2023-02-03 NOTE — Assessment & Plan Note (Signed)
Having trouble losing weight, setting her up again with the nutritionist this time at [facility]. If insufficient improvement we will consider adding a prescription for GLP-1.

## 2023-02-04 DIAGNOSIS — N183 Chronic kidney disease, stage 3 unspecified: Secondary | ICD-10-CM | POA: Insufficient documentation

## 2023-02-04 DIAGNOSIS — N289 Disorder of kidney and ureter, unspecified: Secondary | ICD-10-CM | POA: Insufficient documentation

## 2023-02-04 LAB — LIPID PANEL
Cholesterol: 160 mg/dL (ref <200)
HDL: 66 mg/dL (ref >=50)
LDL Cholesterol (Calc): 78 mg/dL (calc)
Non-HDL Cholesterol (Calc): 94 mg/dL (calc) (ref <130)
Total CHOL/HDL Ratio: 2.4 (calc) (ref <5.0)
Triglycerides: 75 mg/dL (ref <150)

## 2023-02-04 LAB — CBC
HCT: 33.9 % — ABNORMAL LOW (ref 35.0–45.0)
Hemoglobin: 11 g/dL — ABNORMAL LOW (ref 11.7–15.5)
MCH: 28.4 pg (ref 27.0–33.0)
MCHC: 32.4 g/dL (ref 32.0–36.0)
MCV: 87.4 fL (ref 80.0–100.0)
MPV: 11.4 fL (ref 7.5–12.5)
Platelets: 232 10*3/uL (ref 140–400)
RBC: 3.88 10*6/uL (ref 3.80–5.10)
RDW: 13.8 % (ref 11.0–15.0)
WBC: 8.1 10*3/uL (ref 3.8–10.8)

## 2023-02-04 LAB — COMPREHENSIVE METABOLIC PANEL
AG Ratio: 1.3 (calc) (ref 1.0–2.5)
ALT: 18 U/L (ref 6–29)
AST: 21 U/L (ref 10–35)
Albumin: 4.1 g/dL (ref 3.6–5.1)
Alkaline phosphatase (APISO): 130 U/L (ref 37–153)
BUN/Creatinine Ratio: 19 (calc) (ref 6–22)
BUN: 31 mg/dL — ABNORMAL HIGH (ref 7–25)
CO2: 30 mmol/L (ref 20–32)
Calcium: 9.6 mg/dL (ref 8.6–10.4)
Chloride: 104 mmol/L (ref 98–110)
Creat: 1.66 mg/dL — ABNORMAL HIGH (ref 0.60–1.00)
Globulin: 3.2 g/dL (calc) (ref 1.9–3.7)
Glucose, Bld: 102 mg/dL — ABNORMAL HIGH (ref 65–99)
Potassium: 4 mmol/L (ref 3.5–5.3)
Sodium: 142 mmol/L (ref 135–146)
Total Bilirubin: 0.5 mg/dL (ref 0.2–1.2)
Total Protein: 7.3 g/dL (ref 6.1–8.1)

## 2023-02-04 LAB — HEMOGLOBIN A1C
Hgb A1c MFr Bld: 6.9 % of total Hgb — ABNORMAL HIGH (ref <5.7)
Mean Plasma Glucose: 151 mg/dL
eAG (mmol/L): 8.4 mmol/L

## 2023-02-04 LAB — TSH: TSH: 1.67 mIU/L (ref 0.40–4.50)

## 2023-02-04 NOTE — Assessment & Plan Note (Signed)
Newly diagnosed insufficiency with a GFR 33, I would like her to hydrate aggressively and then we can recheck to see if this improves.

## 2023-02-04 NOTE — Assessment & Plan Note (Signed)
Hemoglobin A1c is now in the diabetic range, I would like a virtual visit with her to discuss additional screenings and treatments that she may need.  Gradual insulin resistance and glucose intolerance is a normal part of aging.  Renal function is also slightly abnormal now, I would like her to hydrate aggressively and then we should recheck this and may be a couple of weeks or a month.

## 2023-02-07 NOTE — Telephone Encounter (Signed)
PA information submitted via [email or web address] for Orthovisc Paperwork has been printed and given to Dr. T for signatures. Once obtained, information will be faxed to [employer] at [phone]  

## 2023-02-10 ENCOUNTER — Encounter: Payer: Self-pay | Admitting: Sports Medicine

## 2023-02-10 ENCOUNTER — Ambulatory Visit (INDEPENDENT_AMBULATORY_CARE_PROVIDER_SITE_OTHER): Payer: PPO | Admitting: Sports Medicine

## 2023-02-10 VITALS — BP 132/84 | HR 76 | Ht 62.0 in | Wt 187.0 lb

## 2023-02-10 DIAGNOSIS — E119 Type 2 diabetes mellitus without complications: Secondary | ICD-10-CM

## 2023-02-10 DIAGNOSIS — L03114 Cellulitis of left upper limb: Secondary | ICD-10-CM

## 2023-02-10 LAB — POCT UA - MICROALBUMIN
Creatinine, POC: 10 mg/dL
Microalbumin Ur, POC: 10 mg/L

## 2023-02-10 MED ORDER — DOXYCYCLINE HYCLATE 100 MG PO TABS
100.0000 mg | ORAL_TABLET | Freq: Two times a day (BID) | ORAL | 0 refills | Status: AC
Start: 2023-02-10 — End: 2023-02-17

## 2023-02-10 NOTE — Patient Instructions (Signed)

## 2023-02-10 NOTE — Progress Notes (Addendum)
    Procedures performed today:    None.  Independent interpretation of notes and tests performed by another provider:   None.  Brief History, Exam, Impression, and Recommendations:    Diabetes mellitus type 2, diet-controlled (HCC) Glenda Burke A1c is now in the diabetic range, we talked extensively about the pathophysiology as well as treatment. She is diet controlled currently, will do a urine microalbumin today, she had her eye exam within the past couple weeks. She will check her foot daily. We did a referral to a nutritionist, she has not yet been contacted. Return to see me in 3 months for repeat A1c.  Cellulitis of arm, left Mild cellulitis left deltoid, adding doxycycline.  I spent 30 minutes of total time managing this patient today, this includes chart review, face to face, and non-face to face time.  ____________________________________________ Glenda Burke. Glenda Burke, M.D., ABFM., CAQSM., AME. Primary Care and Sports Medicine [facility]  Adjunct Professor of Family Medicine  [employer]  Restaurant manager, fast food

## 2023-02-10 NOTE — Addendum Note (Signed)
Addended by: Carren Rang A on: 02/10/2023 10:57 AM   Modules accepted: Orders

## 2023-02-10 NOTE — Assessment & Plan Note (Signed)
Glenda Burke's A1c is now in the diabetic range, we talked extensively about the pathophysiology as well as treatment. She is diet controlled currently, will do a urine microalbumin today, she had her eye exam within the past couple weeks. She will check her foot daily. We did a referral to a nutritionist, she has not yet been contacted. Return to see me in 3 months for repeat A1c.

## 2023-02-10 NOTE — Addendum Note (Signed)
Addended by: Monica Becton on: 02/10/2023 10:50 AM   Modules accepted: Orders

## 2023-02-10 NOTE — Assessment & Plan Note (Signed)
Mild cellulitis left deltoid, adding doxycycline.

## 2023-02-12 ENCOUNTER — Telehealth: Payer: Self-pay | Admitting: Sports Medicine

## 2023-02-12 NOTE — Telephone Encounter (Signed)
Spoke to [place] and answered all questions she asked task complete.

## 2023-02-12 NOTE — Telephone Encounter (Signed)
Ronna from [employer] called with some questions about the patients orthovisc and where it needs to be sent to.    Phone - [phone] extension [phone]

## 2023-02-14 NOTE — Telephone Encounter (Signed)
Benefits Investigation Details received from [employer] Injection: Orthovisc PA required: No May fill through: [facility] and [facility] Copay/Coinsurance: 0% Product Copay: 20% Administration Coinsurance: 0% Administration Copay: $20 Out of Pocket Max: $3200 (met: $0)

## 2023-02-18 NOTE — Telephone Encounter (Signed)
Patient notified of approval and she is okay with paying her part and she will call and get scheduled soon.

## 2023-03-11 DIAGNOSIS — H40013 Open angle with borderline findings, low risk, bilateral: Secondary | ICD-10-CM | POA: Diagnosis not present

## 2023-03-19 ENCOUNTER — Ambulatory Visit: Payer: PPO | Admitting: Sports Medicine

## 2023-04-13 ENCOUNTER — Other Ambulatory Visit: Payer: Self-pay | Admitting: Sports Medicine

## 2023-04-13 DIAGNOSIS — I10 Essential (primary) hypertension: Secondary | ICD-10-CM

## 2023-04-13 DIAGNOSIS — E785 Hyperlipidemia, unspecified: Secondary | ICD-10-CM

## 2023-04-14 ENCOUNTER — Other Ambulatory Visit: Payer: Self-pay | Admitting: Sports Medicine

## 2023-04-14 DIAGNOSIS — L409 Psoriasis, unspecified: Secondary | ICD-10-CM

## 2023-05-01 ENCOUNTER — Ambulatory Visit
Payer: PPO | Attending: Student in an Organized Health Care Education/Training Program | Admitting: Student in an Organized Health Care Education/Training Program

## 2023-05-01 ENCOUNTER — Encounter: Payer: Self-pay | Admitting: Student in an Organized Health Care Education/Training Program

## 2023-05-01 VITALS — BP 148/78 | HR 84 | Temp 98.1°F | Resp 18 | Ht 61.0 in | Wt 180.0 lb

## 2023-05-01 DIAGNOSIS — G8929 Other chronic pain: Secondary | ICD-10-CM | POA: Diagnosis present

## 2023-05-01 DIAGNOSIS — M1711 Unilateral primary osteoarthritis, right knee: Secondary | ICD-10-CM | POA: Insufficient documentation

## 2023-05-01 DIAGNOSIS — M25561 Pain in right knee: Secondary | ICD-10-CM | POA: Insufficient documentation

## 2023-05-01 NOTE — Patient Instructions (Signed)
____________________________________________________________________________________________  Genicular Nerve Block  What is a genicular nerve block? A genicular nerve block is the injection of a local anesthetic to block the nerves that transmits pain from the knee.  What is the purpose of a facet nerve block? A genicular nerve block is a diagnostic procedure to determine if the pathologic changes (i.e. arthritis, meniscal tears, etc) and inflammation within the knee joint is the source of your knee pain. It also confirms that the knee pain will respond well to the actual treatment procedure. If a genicular nerve block works, it will give you relief for several hours. After that, the pain is expected to return to normal. This test is always performed twice (usually a week or two apart) because two successful tests are required to move onto treatment. If both diagnostic tests are positive, then we schedule a treatment called radiofrequency (RF) ablation. In this procedure, the same nerves are cauterized, which typically leads to pain relief for 4 -18 months. If this process works well for one knee, it can be performed on the other knee if needed.  How is the procedure performed? You will be placed on the procedure table. The injection site is sterilized with either iodine or chlorhexadine. The site to be injected is numbed with a local anesthetic, and a needle is directed to the target area. X-ray guidance is used to ensure proper placement and positioning of the needle. When the needle is properly positioned near the genicular nerve, local anesthetic is injected to numb that nerve. This will be repeated at multiple sites around the knee to block all genicular nerves.  Will the procedure be painful? The injection can be painful and we therefore provide the option of receiving IV sedation. IV sedation, combined with local anesthetic, can make the injection nearly pain free. It allows you to remain very  still during the procedure, which can also make the injection easier, faster, and more successful. If you decide to have IV sedation, you must have a driver to get you home safely afterwards. In addition, you cannot have anything to eat or drink within 8 hours of your appointment (clear liquids are allowed until 3 hours before the procedure). If you take medications for diabetes, these medications may need to be adjusted the morning of the procedure. Your primary care physician can help you with this adjustment.  What are the discharge instructions? If you received IV sedation do not drive or operate machinery for at least 24 hours after the procedure. You may return to work the next day following your procedure. You may resume your normal diet immediately. Do not engage in any strenuous activity for 24 hours. You should, however, engage in moderate activity that typically causes your ususal pain. If the block works, those activities should not be painful for several hours after the injection. Do not take a bath, swim, or use a hot tub for 24 hours (you may take a shower). Call the office if you have any of the following: severe pain afterwards (different than your usual symptoms), redness/swelling/discharge at the injection site(s), fevers/chills, difficulty with bowel or bladder functions.  What are the risks and side effects? The complication rate for this procedure is very low. Whenever a needle enters the skin, bleeding or infection can occur. Some other serious but extremely rare risks include paralysis and death. You may have an allergic reaction to any of the medications used. If you have a known allergy to any medications, especially local anesthetics, notify  our staff before the procedure takes place. You may experience any of the following side effects up to 4 - 6 hours after the procedure: Leg muscle weakness or numbness may occur due to the local anesthetic affecting the nerves that control  your legs (this is a temporary affect and it is not paralysis). If you have any leg weakness or numbness, walk only with assistance in order to prevent falls and injury. Your leg strength will return slowly and completely. Dizziness may occur due to a decrease in your blood pressure. If this occurs, remain in a seated or lying position. Gradually sit up, and then stand after at least 10 minutes of sitting. Mild headaches may occur. Drink fluids and take pain medications if needed. If the headaches persist or become severe, call the office. Mild discomfort at the injection site can occur. This typically lasts for a few hours but can persist for a couple days. If this occurs, take anti-inflammatories or pain medications, apply ice to the area the day of the procedure. If it persists, apply moist heat in the day(s) following.  The side effects listed above can be normal. They are not dangerous and will resolve on their own. If, however, you experience any of the following, a complication may have occurred and you should either contact your doctor. If he is not readily available, then you should proceed to the closest urgent care center for evaluation: Severe or progressive pain at the injection site(s) Arm or leg weakness that progressively worsens or persists for longer than 8 hours Severe or progressive redness, swelling, or discharge from the injections site(s) Fevers, chills, nausea, or vomiting Bowel or bladder dysfunction (i.e. inability to urinate or pass stool or difficulty controlling either)  How long does it take for the procedure to work? You should feel relief from your usual pain within the first hour. Again, this is only expected to last for several hours, at the most. Remember, you may be sore in the middle part of your back from the needles, and you must distinguish this from your usual pain. ____________________________________________________________________________________________   Pain Management Discharge Instructions  General Discharge Instructions :  If you need to reach your doctor call: Monday-Friday 8:00 am - 4:00 pm at [phone] or toll free [phone].  After clinic hours [phone] to have operator reach doctor.  Bring all of your medication bottles to all your appointments in the pain clinic.  To cancel or reschedule your appointment with Pain Management please remember to call 24 hours in advance to avoid a fee.  Refer to the educational materials which you have been given on: General Risks, I had my Procedure. Discharge Instructions, Post Sedation.  Post Procedure Instructions:  The drugs you were given will stay in your system until tomorrow, so for the next 24 hours you should not drive, make any legal decisions or drink any alcoholic beverages.  You may eat anything you prefer, but it is better to start with liquids then soups and crackers, and gradually work up to solid foods.  Please notify your doctor immediately if you have any unusual bleeding, trouble breathing or pain that is not related to your normal pain.  Depending on the type of procedure that was done, some parts of your body may feel week and/or numb.  This usually clears up by tonight or the next day.  Walk with the use of an assistive device or accompanied by an adult for the 24 hours.  You may use ice   on the affected area for the first 24 hours.  Put ice in a Ziploc bag and cover with a towel and place against area 15 minutes on 15 minutes off.  You may switch to heat after 24 hours. 

## 2023-05-01 NOTE — Progress Notes (Signed)
Patient: Glenda Burke  Service Category: E/M  Provider: Edward Jolly, MD  DOB: May 02, 1951  DOS: 05/01/2023  Referring Provider: Monica Becton  MRN: [id number]  Setting: Ambulatory outpatient  PCP: Monica Becton, MD  Type: New Patient  Specialty: Interventional Pain Management    Location: Office  Delivery: Face-to-face     Primary Reason(s) for Visit: Encounter for initial evaluation of one or more chronic problems (new to examiner) potentially causing chronic pain, and posing a threat to normal musculoskeletal function. (Level of risk: High) CC: Knee Pain (right)  HPI  [name] is a 72 y.o. year old, female patient, who comes for the first time to our practice referred by Monica Becton,* for our initial evaluation of her chronic pain. She has Essential hypertension; Obesity; Environmental allergies; Annual physical exam; Chronic constipation; Hyperlipidemia; Anemia; Bilateral shoulder pain; Primary osteoarthritis of right knee; Trigger thumb of left hand; Diabetes mellitus type 2, diet-controlled (HCC); Primary osteoarthritis of both knees; Tubular adenoma of colon; Psoriasis; Polyp of colon; Normocytic anemia; Primary osteoarthritis of both first carpometacarpal joints; Telogen effluvium; Throat clearing; Renal insufficiency; Cellulitis of arm, left; and Chronic pain of right knee on their problem list. Today she comes in for evaluation of her Knee Pain (right)  Pain Assessment: Location: Right Knee Radiating: denies Onset: More than a month ago Duration: Chronic pain Quality: Aching, Dull Severity: 5 /10 (subjective, self-reported pain score)  Effect on ADL:   Timing: Intermittent Modifying factors: rest BP: (!) 148/78  HR: 84  Onset and Duration: Gradual Cause of pain: Arthritis Severity: No change since onset, NAS-11 at its worse: 5/10, NAS-11 at its best: 0/10, NAS-11 now: 0/10, and NAS-11 on the average: 5/10 Timing: During activity or  exercise Aggravating Factors: Climbing Alleviating Factors: Lying down Associated Problems: Swelling Quality of Pain: Aching, Annoying, Disabling, Fearful, Feeling of constriction, Pressure-like, and Uncomfortable Previous Examinations or Tests: MRI scan Previous Treatments: Physical Therapy and Pool exercises  [name] is being evaluated for possible interventional pain management therapies for the treatment of her chronic pain.  Patient is a very pleasant 72 year old female who presents with a chief complaint of right knee pain related to severe right knee osteoarthritis.  She is being referred from Dr. Karie Schwalbe.  She is very appreciative of his care.  She has done physical therapy, she does aquatic therapy at the [facility], has tried right intra-articular knee steroid injections as well as viscosupplementation with some benefit.  She is being referred here to consider diagnostic right genicular nerve block and possible genicular nerve RFA.  She takes Mobic and is also utilized Voltaren gel to her knees.  She has tried a right knee brace in the past.  Meds   Current Outpatient Medications:    acetaminophen (TYLENOL) 650 MG CR tablet, 1 tablet p.o. 2 times to 3 times a day., Disp: 90 tablet, Rfl: 3   aspirin 81 MG tablet, Take 81 mg by mouth 3 (three) times a week., Disp: , Rfl:    atorvastatin (LIPITOR) 10 MG tablet, TAKE 1 TABLET (10 MG TOTAL) BY MOUTH DAILY AT 6 PM., Disp: 90 tablet, Rfl: 3   Calcium Carbonate-Vit D-Min (CALCIUM 1200 PO), Take by mouth daily., Disp: , Rfl:    clobetasol ointment (TEMOVATE) 0.05 %, APPLY 1 APPLICATION. TOPICALLY 2 (TWO) TIMES DAILY., Disp: 60 g, Rfl: 11   estradiol (ESTRACE VAGINAL) 0.1 MG/GM vaginal cream, Place 1 Applicatorful vaginally 3 (three) times a week., Disp: 42.5 g, Rfl: 12   losartan-hydrochlorothiazide (  HYZAAR) 100-25 MG tablet, TAKE 1 TABLET BY MOUTH EVERY DAY, Disp: 90 tablet, Rfl: 3   calcipotriene (DOVONOX) 0.005 % cream, Apply topically 2 (two)  times daily. (Patient not taking: Reported on 05/01/2023), Disp: 60 g, Rfl: 3   Diclofenac Sodium 2 % SOLN, Place 2 sprays onto the skin 2 (two) times daily. (Patient not taking: Reported on 05/01/2023), Disp: 3 g, Rfl: 0   meloxicam (MOBIC) 15 MG tablet, One tab PO every 24 hours with a meal for 2 weeks, then once every 24 hours prn pain. (Patient not taking: Reported on 05/01/2023), Disp: 90 tablet, Rfl: 3   Minoxidil 5 % FOAM, Apply 1 application topically 2 (two) times daily. Apply twice daily for up to 4 months (Patient not taking: Reported on 05/01/2023), Disp: 60 g, Rfl: 11  Imaging Review  DG Shoulder Right  Narrative CLINICAL DATA:  Chronic bilateral shoulder pain.  EXAM: RIGHT SHOULDER - 2+ VIEW  COMPARISON:  None.  FINDINGS: Degenerative changes in the [facility] joint with joint space narrowing and spurring. Glenohumeral joint is maintained. No acute bony abnormality. Specifically, no fracture, subluxation, or dislocation. Soft tissues are intact.  IMPRESSION: Degenerative changes in the right AC joint. No acute bony abnormality.   Electronically Signed By: Charlett Nose M.D. On: 08/23/2020 14:27  Shoulder-L DG: Results for orders placed in visit on 08/22/20  DG Shoulder Left  Narrative CLINICAL DATA:  Chronic bilateral shoulder pain  EXAM: LEFT SHOULDER - 2+ VIEW  COMPARISON:  None.  FINDINGS: Degenerative changes in the [facility] joint with joint space narrowing and spurring. Glenohumeral joint is maintained. No acute bony abnormality. Specifically, no fracture, subluxation, or dislocation. Soft tissues are intact.  IMPRESSION: Degenerative changes in the left AC joint. No acute bony abnormality.   Electronically Signed By: Charlett Nose M.D. On: 08/23/2020 14:26   DG Lumbar Spine Complete  Narrative CLINICAL DATA:  Ten days of low back pain radiating into both hips and buttocks without known injury  EXAM: LUMBAR SPINE - COMPLETE 4+ VIEW  COMPARISON:   Chest x-ray dated October 24, 2014 for purposes of vertebral level labeling.  FINDINGS: The transverse processes of L1 are transitional. S1 is transitional. The lumbar vertebral bodies are preserved in height. There is grade 1 anterolisthesis of L5 with respect to S1. There is mild degenerative disc space narrowing at L5-S1. There is facet joint hypertrophy at L4-5 and S1-S2. No pars defects are demonstrated on the oblique views. The pedicles and transverse processes appear intact. 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  IMPRESSION: Transitional anatomy as described. There is grade 1 anterolisthesis of L5 with respect S1 likely on the basis of mild degenerative disc disease and facet joint change. There is facet joint hypertrophy also at L4-5 S1-S2. There is no compression fracture.   Electronically Signed By: David  [place] M.D. On: 12/12/2015 11:30 DG HIPS BILAT WITH PELVIS 2V  Narrative CLINICAL DATA:  Low back pain radiating into both hips and the buttocks for the past 10 days.  EXAM: DG HIP (WITH OR WITHOUT PELVIS) 2V BILAT  COMPARISON:  None in PACs  FINDINGS: The bony pelvis is adequately mineralized. There is no lytic or blastic lesion. No acute or old fracture is observed. The sacrum and SI joints exhibit no significant abnormalities.  AP and lateral views of both hips reveal reasonable preservation of the joint spaces. The articular surfaces of the femoral heads and acetabuli remains smoothly rounded. The femoral necks, intertrochanteric, and subtrochanteric regions are normal.  The soft tissues are unremarkable.  IMPRESSION: There is no acute or significant chronic bony abnormality of the pelvis or of the hips.   Electronically Signed By: David  [place] M.D. On: 12/12/2015 11:35   MR KNEE RIGHT WO CONTRAST  Narrative CLINICAL DATA:  Persistent and severe right knee pain along the medial joint line. Pain, stiffness and discomfort when walking for  6 months. No previous relevant surgery.  EXAM: MRI OF THE RIGHT KNEE WITHOUT CONTRAST  TECHNIQUE: Multiplanar, multisequence MR imaging of the knee was performed. No intravenous contrast was administered.  COMPARISON:  Radiographs 05/09/2021  FINDINGS: MENISCI  Medial meniscus: Meniscal degeneration and moderate peripheral extrusion of the meniscus from the joint space. The meniscal root is intact, and no focal meniscal tear or displaced meniscal fragment identified.  Lateral meniscus:  Intact with normal morphology.  LIGAMENTS  Cruciates:  Intact.  Collaterals:  Intact.  CARTILAGE  Patellofemoral: Advanced patellofemoral degenerative changes with chondral thinning, osteophytes and subchondral edema, greatest laterally.  Medial: Diffuse chondral thinning with prominent peripheral osteophytes and subchondral edema peripherally.  Lateral: Mild chondral thinning with prominent peripheral osteophytes and asymmetric subchondral edema in the lateral tibial plateau.  MISCELLANEOUS  Joint: Moderate-sized knee joint effusion. No definitive intra-articular loose bodies identified.  Popliteal Fossa: The popliteus muscle and tendon are intact. No significant Baker's cyst.  Extensor Mechanism:  Intact.  Bones:  No acute or significant extra-articular osseous findings.  Other: Mild prepatellar subcutaneous edema.  IMPRESSION: 1. Advanced tricompartmental osteoarthritis with associated prominent osteophytes and subchondral edema. No acute osseous findings. 2. Medial meniscal degeneration and partial peripheral extrusion from the joint. No discrete radial tear or displaced meniscal fragment. 3. The lateral meniscus, cruciate and collateral ligaments are intact. 4. Moderate size knee joint effusion.   Electronically Signed By: Carey Bullocks M.D. On: 04/17/2022 10:53    Narrative CLINICAL DATA:  Primary osteoarthritis of right knee.  EXAM: LEFT KNEE - 1-2  VIEW  COMPARISON:  None.  FINDINGS: There are moderate to advanced degenerative changes within the right knee with joint space narrowing and spurring most pronounced in the patellofemoral compartment. Joint space narrowing noted in the medial and lateral compartments of the left knee on the included AP view. No acute bony abnormality. Specifically, no fracture, subluxation, or dislocation. Soft tissues are intact. No joint effusion.  IMPRESSION: Moderate to advanced degenerative changes in the right knee, most pronounced in the patellofemoral compartment.   Electronically Signed By: Charlett Nose M.D. On: 06/02/2015 13:44  Knee-R DG 3 views: No results found for this or any previous visit.  Knee-L DG 3 views: No results found for this or any previous visit.  Knee-R DG 4 views: Results for orders placed in visit on 05/09/21  DG Knee Complete 4 Views Right  Narrative CLINICAL DATA:  Primary osteoarthritis of both knees.  EXAM: LEFT KNEE - COMPLETE 4+ VIEW; RIGHT KNEE - COMPLETE 4+ VIEW  COMPARISON:  Radiograph 06/02/2015  FINDINGS: Right knee: Moderate medial tibiofemoral joint space narrowing. There is lateral patellar tilt and lateral patellofemoral joint space narrowing, with slight progression from prior exam. Moderate to advanced tricompartmental peripheral osteophytes. No erosion, bone destruction, or focal osseous lesion. Small quadriceps tendon enthesophyte. No significant joint effusion.  Left knee: Minimal medial tibiofemoral joint space narrowing. There is lateral patellar tilt. Moderate to large tricompartmental osteophytes most prominently affecting the patellofemoral compartment. Subchondral cystic change in the lateral proximal tibia is unchanged from previous. No erosion, bone destruction or focal lesion. No  significant joint effusion. Small quadriceps tendon enthesophyte.  IMPRESSION: 1. Moderate tricompartmental osteoarthritis of both  knees, patellofemoral predominant. Slight progression of right knee degenerative change from 2016 exam. 2. Lateral patellar tilt bilaterally, right greater than left.   Electronically Signed By: Narda Rutherford M.D. On: 05/10/2021 16:35  Knee-L DG 4 views: Results for orders placed in visit on 05/09/21  DG Knee Complete 4 Views Left  Narrative CLINICAL DATA:  Primary osteoarthritis of both knees.  EXAM: LEFT KNEE - COMPLETE 4+ VIEW; RIGHT KNEE - COMPLETE 4+ VIEW  COMPARISON:  Radiograph 06/02/2015  FINDINGS: Right knee: Moderate medial tibiofemoral joint space narrowing. There is lateral patellar tilt and lateral patellofemoral joint space narrowing, with slight progression from prior exam. Moderate to advanced tricompartmental peripheral osteophytes. No erosion, bone destruction, or focal osseous lesion. Small quadriceps tendon enthesophyte. No significant joint effusion.  Left knee: Minimal medial tibiofemoral joint space narrowing. There is lateral patellar tilt. Moderate to large tricompartmental osteophytes most prominently affecting the patellofemoral compartment. Subchondral cystic change in the lateral proximal tibia is unchanged from previous. No erosion, bone destruction or focal lesion. No significant joint effusion. Small quadriceps tendon enthesophyte.  IMPRESSION: 1. Moderate tricompartmental osteoarthritis of both knees, patellofemoral predominant. Slight progression of right knee degenerative change from 2016 exam. 2. Lateral patellar tilt bilaterally, right greater than left.   Electronically Signed By: Narda Rutherford M.D. On: 05/10/2021 16:35   Narrative CLINICAL DATA:  Left heel pain for several months without trauma.  EXAM: LEFT FOOT - COMPLETE 3+ VIEW  COMPARISON:  None.  FINDINGS: There is a hallux valgus deformity. Degenerative changes are seen at the first MTP joint. No fractures or dislocations. No other acute abnormalities.  Small plantar spur.  IMPRESSION: Small plantar spur.  No other cause for heel pain identified.   Electronically Signed By: Gerome Sam III M.D On: 04/24/2018 14:16     Complexity Note: Imaging results reviewed.                         ROS  Cardiovascular: Daily Aspirin intake Pulmonary or Respiratory: Snoring  and Coughing up mucus (Bronchitis) Neurological: No reported neurological signs or symptoms such as seizures, abnormal skin sensations, urinary and/or fecal incontinence, being born with an abnormal open spine and/or a tethered spinal cord Psychological-Psychiatric: No reported psychological or psychiatric signs or symptoms such as difficulty sleeping, anxiety, depression, delusions or hallucinations (schizophrenial), mood swings (bipolar disorders) or suicidal ideations or attempts Gastrointestinal: No reported gastrointestinal signs or symptoms such as vomiting or evacuating blood, reflux, heartburn, alternating episodes of diarrhea and constipation, inflamed or scarred liver, or pancreas or irrregular and/or infrequent bowel movements Genitourinary: No reported renal or genitourinary signs or symptoms such as difficulty voiding or producing urine, peeing blood, non-functioning kidney, kidney stones, difficulty emptying the bladder, difficulty controlling the flow of urine, or chronic kidney disease Hematological: No reported hematological signs or symptoms such as prolonged bleeding, low or poor functioning platelets, bruising or bleeding easily, hereditary bleeding problems, low energy levels due to low hemoglobin or being anemic Endocrine: No reported endocrine signs or symptoms such as high or low blood sugar, rapid heart rate due to high thyroid levels, obesity or weight gain due to slow thyroid or thyroid disease Rheumatologic: Joint aches and or swelling due to excess weight (Osteoarthritis) Musculoskeletal: Negative for myasthenia gravis, muscular dystrophy, multiple  sclerosis or malignant hyperthermia Work History: Retired  Allergies  [name] is allergic to hydrocodone.  Laboratory Chemistry Profile   Renal Lab Results  Component Value Date   BUN 31 (H) 02/03/2023   CREATININE 1.66 (H) 02/03/2023   BCR 19 02/03/2023   GFR 64.98 12/17/2017   GFRAA 85 03/30/2019   GFRNONAA 73 03/30/2019   SPECGRAV 1.012 01/26/2018   PHUR 7.0 01/26/2018   PROTEINUR Negative 01/26/2018     Electrolytes Lab Results  Component Value Date   NA 142 02/03/2023   K 4.0 02/03/2023   CL 104 02/03/2023   CALCIUM 9.6 02/03/2023     Hepatic Lab Results  Component Value Date   AST 21 02/03/2023   ALT 18 02/03/2023   ALBUMIN 3.8 12/17/2017   ALKPHOS 108 12/17/2017     ID Lab Results  Component Value Date   HIV NONREACTIVE 08/28/2016   SARSCOV2NAA Not Detected 11/07/2019   HCVAB NEGATIVE 08/28/2016     Bone Lab Results  Component Value Date   VD25OH 34 03/30/2019     Endocrine Lab Results  Component Value Date   GLUCOSE 102 (H) 02/03/2023   GLUCOSEU Negative 01/26/2018   HGBA1C 6.9 (H) 02/03/2023   TSH 1.67 02/03/2023     Neuropathy Lab Results  Component Value Date   VITAMINB12 668 06/07/2019   FOLATE 10.6 06/07/2019   HGBA1C 6.9 (H) 02/03/2023   HIV NONREACTIVE 08/28/2016     CNS No results found for: "COLORCSF", "APPEARCSF", "RBCCOUNTCSF", "WBCCSF", "POLYSCSF", "LYMPHSCSF", "EOSCSF", "PROTEINCSF", "GLUCCSF", "JCVIRUS", "CSFOLI", "IGGCSF", "LABACHR", "ACETBL"   Inflammation (CRP: Acute  ESR: Chronic) No results found for: "CRP", "ESRSEDRATE", "LATICACIDVEN"   Rheumatology No results found for: "RF", "ANA", "LABURIC", "URICUR", "LYMEIGGIGMAB", "LYMEABIGMQN", "HLAB27"   Coagulation Lab Results  Component Value Date   PLT 232 02/03/2023     Cardiovascular Lab Results  Component Value Date   HGB 11.0 (L) 02/03/2023   HCT 33.9 (L) 02/03/2023     Screening Lab Results  Component Value Date   SARSCOV2NAA Not Detected  11/07/2019   HCVAB NEGATIVE 08/28/2016   HIV NONREACTIVE 08/28/2016     Cancer No results found for: "CEA", "CA125", "LABCA2"   Allergens No results found for: "ALMOND", "APPLE", "ASPARAGUS", "AVOCADO", "BANANA", "BARLEY", "BASIL", "BAYLEAF", "GREENBEAN", "LIMABEAN", "WHITEBEAN", "BEEFIGE", "REDBEET", "BLUEBERRY", "BROCCOLI", "CABBAGE", "MELON", "CARROT", "CASEIN", "CASHEWNUT", "CAULIFLOWER", "CELERY"     Note: Lab results reviewed.  PFSH  Drug: [name]  reports no history of drug use. Alcohol:  reports current alcohol use. Tobacco:  reports that she has never smoked. She has never used smokeless tobacco. Medical:  has a past medical history of Allergy, Anemia, Arthritis, Cataract, Hyperlipidemia, Hypertension, and Menopausal state. Family: family history includes Congestive Heart Failure in her mother; Diabetes in her brother, father, and sister; Hyperlipidemia in her brother, father, mother, and sister; Hypertension in her brother, father, mother, and sister; Kidney disease in her brother and sister; Stroke in her father.  Past Surgical History:  Procedure Laterality Date   arm surgery Left    plate and screw   COLONOSCOPY  ?   [place] of state   DILATION AND CURETTAGE OF UTERUS     polypectomy     Active Ambulatory Problems    Diagnosis Date Noted   Essential hypertension 02/18/2013   Obesity 02/18/2013   Environmental allergies 02/18/2013   Annual physical exam 02/18/2013   Chronic constipation 02/18/2013   Hyperlipidemia 02/24/2013   Anemia 02/24/2013   Bilateral shoulder pain 12/07/2014   Primary osteoarthritis of right knee 06/02/2015   Trigger thumb of left hand 11/12/2016  Diabetes mellitus type 2, diet-controlled (HCC) 12/11/2017   Primary osteoarthritis of both knees 12/11/2017   Tubular adenoma of colon 01/16/2018   Psoriasis 04/24/2018   Polyp of colon 09/22/2018   Normocytic anemia 03/31/2019   Primary osteoarthritis of both first carpometacarpal joints  08/22/2020   Telogen effluvium 10/25/2020   Throat clearing 10/24/2021   Renal insufficiency 02/04/2023   Cellulitis of arm, left 02/10/2023   Chronic pain of right knee 05/01/2023   Resolved Ambulatory Problems    Diagnosis Date Noted   Axial Low back pain 04/23/2013   Ingrown left big toenail 08/24/2013   Allergic reaction 08/31/2013   Acute bronchitis 10/24/2014   Farsightedness 06/02/2015   Mass of breast, right 09/01/2015   Tinea corporis 08/28/2016   Lower leg abrasion, right, initial encounter 05/16/2017   Obesity (BMI 35.0-39.9 without comorbidity) 12/11/2017   Past Medical History:  Diagnosis Date   Allergy    Arthritis    Cataract    Hypertension    Menopausal state    Constitutional Exam  General appearance: Well nourished, well developed, and well hydrated. In no apparent acute distress Vitals:   05/01/23 1109  BP: (!) 148/78  Pulse: 84  Resp: 18  Temp: 98.1 F (36.7 C)  TempSrc: Temporal  SpO2: 99%  Weight: 180 lb (81.6 kg)  Height: 5\' 1"  (1.549 m)   BMI Assessment: Estimated body mass index is 34.01 kg/m as calculated from the following:   Height as of this encounter: 5\' 1"  (1.549 m).   Weight as of this encounter: 180 lb (81.6 kg).  BMI interpretation table: BMI level Category Range association with higher incidence of chronic pain  <18 kg/m2 Underweight   18.5-24.9 kg/m2 Ideal body weight   25-29.9 kg/m2 Overweight Increased incidence by 20%  30-34.9 kg/m2 Obese (Class I) Increased incidence by 68%  35-39.9 kg/m2 Severe obesity (Class II) Increased incidence by 136%  >40 kg/m2 Extreme obesity (Class III) Increased incidence by 254%   Patient's current BMI Ideal Body weight  Body mass index is 34.01 kg/m. Ideal body weight: 47.8 kg (105 lb 6.1 oz) Adjusted ideal body weight: 61.3 kg (135 lb 3.6 oz)   BMI Readings from Last 4 Encounters:  05/01/23 34.01 kg/m  02/10/23 34.20 kg/m  04/24/22 33.65 kg/m  04/08/22 33.65 kg/m   Wt Readings  from Last 4 Encounters:  05/01/23 180 lb (81.6 kg)  02/10/23 187 lb (84.8 kg)  04/24/22 184 lb (83.5 kg)  04/08/22 184 lb (83.5 kg)    Psych/Mental status: Alert, oriented x 3 (person, place, & time)       Eyes: PERLA Respiratory: No evidence of acute respiratory distress  Lumbar Spine Area Exam  Skin & Axial Inspection: No masses, redness, or swelling Alignment: Symmetrical Functional ROM: Unrestricted ROM       Stability: No instability detected Muscle Tone/Strength: Functionally intact. No obvious neuro-muscular anomalies detected. Sensory (Neurological): Unimpaired Palpation: No palpable anomalies       Provocative Tests: Hyperextension/rotation test: deferred today       Lumbar quadrant test (Kemp's test): deferred today       Lateral bending test: deferred today       Patrick's Maneuver: deferred today                   FABER* test: deferred today                   S-I anterior distraction/compression test: deferred today  S-I lateral compression test: deferred today         S-I Thigh-thrust test: deferred today         S-I Gaenslen's test: deferred today         *(Flexion, ABduction and External Rotation) Gait & Posture Assessment  Ambulation: Unassisted Gait: Relatively normal for age and body habitus Posture: WNL  Lower Extremity Exam    Side: Right lower extremity  Side: Left lower extremity  Stability: No instability observed          Stability: No instability observed          Skin & Extremity Inspection: Skin color, temperature, and hair growth are WNL. No peripheral edema or cyanosis. No masses, redness, swelling, asymmetry, or associated skin lesions. No contractures.  Skin & Extremity Inspection: Skin color, temperature, and hair growth are WNL. No peripheral edema or cyanosis. No masses, redness, swelling, asymmetry, or associated skin lesions. No contractures.  Functional ROM: Pain restricted ROM for knee joint          Functional ROM: Unrestricted ROM                   Muscle Tone/Strength: Functionally intact. No obvious neuro-muscular anomalies detected.  Muscle Tone/Strength: Functionally intact. No obvious neuro-muscular anomalies detected.  Sensory (Neurological): Arthropathic arthralgia        Sensory (Neurological): Unimpaired        DTR: Patellar: deferred today Achilles: deferred today Plantar: deferred today  DTR: Patellar: deferred today Achilles: deferred today Plantar: deferred today  Palpation: No palpable anomalies  Palpation: No palpable anomalies    Assessment  Primary Diagnosis & Pertinent Problem List: The primary encounter diagnosis was Primary osteoarthritis of right knee. A diagnosis of Chronic pain of right knee was also pertinent to this visit.  Visit Diagnosis (New problems to examiner): 1. Primary osteoarthritis of right knee   2. Chronic pain of right knee    Plan of Care   Discussed right genicular nerve block and if effective can consider right genicular nerve radiofrequency ablation.  Risk and benefits reviewed and patient would like to proceed.  Procedure Orders         GENICULAR NERVE BLOCK     Provider-requested follow-up: Return in about 18 days (around 05/19/2023) for [facility].  Future Appointments  Date Time Provider Department Center  05/28/2023  9:40 AM [facility]  05/28/2023 10:20 AM [facility]2 [facility]  06/12/2023 11:00 AM Monica Becton, MD [facility] None    Duration of encounter: .  Total time on encounter, as per AMA guidelines included both the face-to-face and non-face-to-face time personally spent by the physician and/or other qualified health care professional(s) on the day of the encounter (includes time in activities that require the physician or other qualified health care professional and does not include time in activities normally performed by clinical staff). Physician's time may include the following activities when performed: Preparing  to see the patient (e.g., pre-charting review of records, searching for previously ordered imaging, lab work, and nerve conduction tests) Review of prior analgesic pharmacotherapies. Reviewing PMP Interpreting ordered tests (e.g., lab work, imaging, nerve conduction tests) Performing post-procedure evaluations, including interpretation of diagnostic procedures Obtaining and/or reviewing separately obtained history Performing a medically appropriate examination and/or evaluation Counseling and educating the patient/family/caregiver Ordering medications, tests, or procedures Referring and communicating with other health care professionals (when not separately reported) Documenting clinical information in the electronic or other health record Independently  interpreting results (not separately reported) and communicating results to the patient/ family/caregiver Care coordination (not separately reported)  Note by: Edward Jolly, MD (TTS technology used. I apologize for any typographical errors that were not detected and corrected.) Date: 05/01/2023; Time: 11:36 AM

## 2023-05-01 NOTE — Progress Notes (Signed)
Safety precautions to be maintained throughout the outpatient stay will include: orient to surroundings, keep bed in low position, maintain call bell within reach at all times, provide assistance with transfer out of bed and ambulation.  

## 2023-05-21 ENCOUNTER — Ambulatory Visit: Payer: PPO | Admitting: Sports Medicine

## 2023-05-28 ENCOUNTER — Other Ambulatory Visit: Payer: PPO

## 2023-06-12 ENCOUNTER — Ambulatory Visit: Payer: PPO | Admitting: Sports Medicine

## 2023-07-24 ENCOUNTER — Ambulatory Visit
Admission: RE | Admit: 2023-07-24 | Discharge: 2023-07-24 | Disposition: A | Discharge status: Home / Self Care | Payer: PPO | Source: Ambulatory Visit | Attending: Sports Medicine | Admitting: Sports Medicine

## 2023-07-24 DIAGNOSIS — Z780 Asymptomatic menopausal state: Secondary | ICD-10-CM | POA: Insufficient documentation

## 2023-07-24 DIAGNOSIS — Z1231 Encounter for screening mammogram for malignant neoplasm of breast: Secondary | ICD-10-CM | POA: Insufficient documentation

## 2023-07-24 DIAGNOSIS — Z13820 Encounter for screening for osteoporosis: Secondary | ICD-10-CM | POA: Insufficient documentation

## 2023-07-24 DIAGNOSIS — Z0000 Encounter for general adult medical examination without abnormal findings: Secondary | ICD-10-CM

## 2023-07-24 DIAGNOSIS — Z0189 Encounter for other specified special examinations: Secondary | ICD-10-CM | POA: Diagnosis not present

## 2023-08-07 ENCOUNTER — Encounter: Payer: Self-pay | Admitting: Sports Medicine

## 2023-08-07 ENCOUNTER — Ambulatory Visit (INDEPENDENT_AMBULATORY_CARE_PROVIDER_SITE_OTHER): Payer: PPO | Admitting: Sports Medicine

## 2023-08-07 VITALS — BP 118/80 | HR 72

## 2023-08-07 DIAGNOSIS — L409 Psoriasis, unspecified: Secondary | ICD-10-CM

## 2023-08-07 DIAGNOSIS — M170 Bilateral primary osteoarthritis of knee: Secondary | ICD-10-CM | POA: Diagnosis not present

## 2023-08-07 DIAGNOSIS — G8929 Other chronic pain: Secondary | ICD-10-CM

## 2023-08-07 DIAGNOSIS — M545 Low back pain, unspecified: Secondary | ICD-10-CM | POA: Diagnosis not present

## 2023-08-07 MED ORDER — CALCIPOTRIENE 0.005 % EX CREA
TOPICAL_CREAM | Freq: Two times a day (BID) | CUTANEOUS | 3 refills | Status: AC
Start: 2023-08-07 — End: ?

## 2023-08-07 NOTE — Assessment & Plan Note (Signed)
Glenda Burke does have some hip pain and back pain, she has known lumbar spondylitic changes, somewhat of a Trendelenburg gait likely related to hip abductor weakness as well. I would like her to do some additional physical therapy, it sounds like she [place] to work on the hips as well as the knees. I will place this order now. If insufficient improvement after 6 weeks or so we can consider advanced imaging of the lumbar spine and potentially hip as well.

## 2023-08-07 NOTE — Assessment & Plan Note (Signed)
Glenda Burke has a long history of bilateral knee pain with known osteoarthritis without significant meniscal tearing on MRI, she has had multiple interventions including physical therapy, steroid injections, viscosupplementation, she takes Tylenol 3 times daily. She did well with Orthovisc in the past, she is currently doing pretty good today. She did discuss genicular radiofrequency ablation with Dr. Cherylann Ratel at [facility]. She has not yet consider geniculate artery embolization which may provide longer term relief. At this point she is doing really well so we will not make any changes other than some physical therapy however if pain returns I would recommend we start with viscosupplementation, if this fails would suggest we switch to geniculate artery embolization.

## 2023-08-07 NOTE — Assessment & Plan Note (Signed)
Psoriasis, widespread, elbows, now left lower leg, historically controlled with calcipotriene and topical clobetasol. She does not have a dermatologist. She does have some significant hypopigmentation around a psoriatic plaque left lower leg, I would like her to stop the clobetasol for now, she can focus on calcipotriene and I do think we need dermatology involved at this point, we will try to find one in [place].

## 2023-08-07 NOTE — Progress Notes (Signed)
    Procedures performed today:    None.  Independent interpretation of notes and tests performed by another provider:   None.  Brief History, Exam, Impression, and Recommendations:    Primary osteoarthritis of both knees Edelynn has a long history of bilateral knee pain with known osteoarthritis without significant meniscal tearing on MRI, she has had multiple interventions including physical therapy, steroid injections, viscosupplementation, she takes Tylenol 3 times daily. She did well with Orthovisc in the past, she is currently doing pretty good today. She did discuss genicular radiofrequency ablation with Dr. Cherylann Ratel at [facility]. She has not yet consider geniculate artery embolization which may provide longer term relief. At this point she is doing really well so we will not make any changes other than some physical therapy however if pain returns I would recommend we start with viscosupplementation, if this fails would suggest we switch to geniculate artery embolization.  Psoriasis Psoriasis, widespread, elbows, now left lower leg, historically controlled with calcipotriene and topical clobetasol. She does not have a dermatologist. She does have some significant hypopigmentation around a psoriatic plaque left lower leg, I would like her to stop the clobetasol for now, she can focus on calcipotriene and I do think we need dermatology involved at this point, we will try to find one in [place].  Axial Low back pain Jora does have some hip pain and back pain, she has known lumbar spondylitic changes, somewhat of a Trendelenburg gait likely related to hip abductor weakness as well. I would like her to do some additional physical therapy, it sounds like she needs a new order for her therapist in [place] to be able to work on the hips as well as the knees. I will place this order now. If insufficient improvement after 6 weeks or so we can consider advanced imaging of the  lumbar spine and potentially hip as well.    ____________________________________________ Ihor Austin. Benjamin Stain, M.D., ABFM., CAQSM., AME. Primary Care and Sports Medicine [facility]  Adjunct Professor of Family Medicine  [employer] [facility] [employer]  Restaurant manager, fast food

## 2023-09-03 ENCOUNTER — Ambulatory Visit: Payer: PPO | Attending: Sports Medicine | Admitting: Physical Therapy

## 2023-09-03 DIAGNOSIS — M25561 Pain in right knee: Secondary | ICD-10-CM | POA: Insufficient documentation

## 2023-09-03 DIAGNOSIS — G8929 Other chronic pain: Secondary | ICD-10-CM | POA: Insufficient documentation

## 2023-09-03 DIAGNOSIS — R262 Difficulty in walking, not elsewhere classified: Secondary | ICD-10-CM | POA: Insufficient documentation

## 2023-09-03 DIAGNOSIS — M170 Bilateral primary osteoarthritis of knee: Secondary | ICD-10-CM | POA: Insufficient documentation

## 2023-09-03 DIAGNOSIS — M545 Low back pain, unspecified: Secondary | ICD-10-CM | POA: Insufficient documentation

## 2023-09-03 NOTE — Therapy (Incomplete)
OUTPATIENT PHYSICAL THERAPY EVALUATION   Patient Name: Glenda Burke MRN: [id number] DOB:03-28-1951, 72 y.o., female Today's Date: 09/03/2023  END OF SESSION:   Past Medical History:  Diagnosis Date   Allergy    Anemia    Arthritis    Cataract    Hyperlipidemia    Hypertension    Menopausal state    Past Surgical History:  Procedure Laterality Date   arm surgery Left    plate and screw   [place]ONOSCOPY  ?   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   Primary osteoarthritis of both first carpometacarpal joints 08/22/2020   Normocytic anemia 03/31/2019   Polyp of colon 09/22/2018   Psoriasis 04/24/2018   Tubular adenoma of colon 01/16/2018   Diabetes mellitus type 2, diet-controlled (HCC) 12/11/2017   Primary osteoarthritis of both knees 12/11/2017   Trigger thumb of left hand 11/12/2016   Primary osteoarthritis of right knee 06/02/2015   Bilateral shoulder pain 12/07/2014   Axial Low back pain 04/23/2013   Hyperlipidemia 02/24/2013   Anemia 02/24/2013   Essential hypertension 02/18/2013   Obesity 02/18/2013   Environmental allergies 02/18/2013   Annual physical exam 02/18/2013   Chronic constipation 02/18/2013    PCP: ***  REFERRING PROVIDER: ***  REFERRING DIAG: ***  Rationale for Evaluation and Treatment: Rehabilitation  THERAPY DIAG:  No diagnosis found.  ONSET DATE: ***  SUBJECTIVE:                                                                                                                                                                                           SUBJECTIVE STATEMENT: Patient stats her back is not bothering her. She states she has bilateral hip pain and right knee pain that she would like to be addressed. She states she  had PT last year for her right knee, then the doctor gave her "gel shots" in her right knee (August 2023) and that has helped a lot. (She gave percentage points but I did not get them ***). R knee started really bothering her March 2023. She thinks arthritis was probably there before but it did not stop her from doing things. She has had xray and MRI and found to have OA there.   Her hips are bothering her more. She states she states her pain is at bilateral lower glutes.   When she walks a lot the  right side is more painful than the left. It is about 1-2/10 but it is like a nag all the time. She thinks she has arthritis in her hips as well. Hip pain started in the last 2 months. Before it was more intermittent, but in the last 2 months it has been staying longer. Slowly getting worse.  Pain is not unbearable.   She has also notices she leans side to side when she walks, and she thinks that is causing the stress on the hips. As she walks more she feels the lateral side to side.   Feels increased pain in her hips/knee after walking 3 miles before PT appointment.   PERTINENT HISTORY:  Patient is a 72 y.o. female who presents to outpatient physical therapy with a referral for medical diagnosis ***. This patient's chief complaints consist of ***, leading to the following functional deficits: ***. Relevant past medical history and comorbidities include ***.  Patient denies hx of cancer, stroke, seizures, lung problems, heart problems, diabetes, unexplained weight loss, unexplained changes in bowel or bladder problems, unexplained stumbling or dropping things, osteoporosis, and spinal surgery    PAIN: Are you having pain? Yes  R knee (to be further examined at future visit, focus today on hip) NPRS: Current: 0/10 when not walking. Pain location: right medial knee Pain description: *** Aggravating factors: when walking  Relieving factors: ***   B hips: NPRS: Current: 1-2/10/10,  Best: 0/10, Worst:  2/10. Pain location: bilateral deep hip rotator region, R > L, can intermittently radiate to lateral thigh or to right knee.  Pain description: dull, mostly constant Aggravating factors: prolonged walking, prolonged sitting, prolonged standing, anything for prolonged periods at time (even at night), lying down, stiff in morning.  Relieving factors: stretches in bed (supine butterfly stretch, laying prone with one knee up, yoga downward dog, bridge - feet elevated?, figure 4 stretch).  FUNCTIONAL LIMITATIONS: ***  LEISURE: very active, lots of volunteer work, exercise 5 days a week (weight lifting, aerobics, swimming in salt water - one of the greatest for her).   PRECAUTIONS: avoid high impact exercises that she used to do in 2023  WEIGHT BEARING RESTRICTIONS: No  FALLS:  Has patient fallen in last 6 months? No  OCCUPATION: Retired, used to work for [employer]  PLOF: {PLOF:24004}  PATIENT GOALS: I want to know certain types of exercises to help relieve the pain I am now in. I would like to be able to walk better, because I think that is the cause of the pain.   NEXT MD VISIT: ***  OBJECTIVE:  Note: Objective measures were completed at Evaluation unless otherwise noted.  DIAGNOSTIC FINDINGS:  ***  PATIENT SURVEYS:  {rehab surveys:24030}  SCREENING FOR RED FLAGS: Bowel or bladder incontinence: {Yes/No:304960894} Spinal tumors: {Yes/No:304960894} Cauda equina syndrome: {Yes/No:304960894} Compression fracture: {Yes/No:304960894} Abdominal aneurysm: {Yes/No:304960894}  COGNITION: Overall cognitive status: {cognition:24006}     SENSATION: {sensation:27233}  MUSCLE LENGTH: Hamstrings: Right *** deg; Left *** deg Thomas test: Right *** deg; Left *** deg  POSTURE: {posture:25561}  PALPATION: ***  LUMBAR ROM:   AROM eval  Flexion   Extension   Right lateral flexion   Left lateral flexion   Right rotation   Left rotation    (Blank rows = not tested)  LOWER EXTREMITY  ROM:     {AROM/PROM:27142}  Right eval Left eval  Hip flexion    Hip extension    Hip abduction    Hip adduction    Hip internal rotation  Hip external rotation    Knee flexion    Knee extension    Ankle dorsiflexion    Ankle plantarflexion    Ankle inversion    Ankle eversion     (Blank rows = not tested)  LOWER EXTREMITY MMT:    MMT Right eval Left eval  Hip flexion    Hip extension    Hip abduction    Hip adduction    Hip internal rotation    Hip external rotation    Knee flexion    Knee extension    Ankle dorsiflexion    Ankle plantarflexion    Ankle inversion    Ankle eversion     (Blank rows = not tested)  LUMBAR SPECIAL TESTS:  {lumbar special test:25242}  FUNCTIONAL TESTS:  {Functional tests:24029}  GAIT: Distance walked: *** Assistive device utilized: {Assistive devices:23999} Level of assistance: {Levels of assistance:24026} Comments: ***  TODAY'S TREATMENT:                                                                                                                              DATE: ***    PATIENT EDUCATION:  Education details: *** Person educated: {Person educated:25204} Education method: {Education Method:25205} Education comprehension: {Education Comprehension:25206}  HOME EXERCISE PROGRAM: ***  ASSESSMENT:  CLINICAL IMPRESSION: Patient is a *** y.o. *** who was seen today for physical therapy evaluation and treatment for ***.   OBJECTIVE IMPAIRMENTS: {opptimpairments:25111}.   ACTIVITY LIMITATIONS: {activitylimitations:27494}  PARTICIPATION LIMITATIONS: {participationrestrictions:25113}  PERSONAL FACTORS: {Personal factors:25162} are also affecting patient's functional outcome.   REHAB POTENTIAL: {rehabpotential:25112}  CLINICAL DECISION MAKING: {clinical decision making:25114}  EVALUATION COMPLEXITY: {Evaluation complexity:25115}   GOALS: Goals reviewed with patient? {yes/no:20286}  SHORT TERM GOALS: Target  date: ***  *** Baseline: Goal status: INITIAL  2.  *** Baseline:  Goal status: INITIAL  3.  *** Baseline:  Goal status: INITIAL  4.  *** Baseline:  Goal status: INITIAL  5.  *** Baseline:  Goal status: INITIAL  6.  *** Baseline:  Goal status: INITIAL  LONG TERM GOALS: Target date: ***  *** Baseline:  Goal status: INITIAL  2.  *** Baseline:  Goal status: INITIAL  3.  *** Baseline:  Goal status: INITIAL  4.  *** Baseline:  Goal status: INITIAL  5.  *** Baseline:  Goal status: INITIAL  6.  *** Baseline:  Goal status: INITIAL  PLAN:  PT FREQUENCY: {rehab frequency:25116}  PT DURATION: {rehab duration:25117}  PLANNED INTERVENTIONS: {rehab planned interventions:25118::"97110-Therapeutic exercises","97530- Therapeutic [phone]- Neuromuscular re-education","97535- Self UXLK","44010- Manual therapy"}.  PLAN FOR NEXT SESSION: ***   Cira Rue, PT 09/03/2023, 4:30 PM

## 2023-09-03 NOTE — Therapy (Cosign Needed)
OUTPATIENT PHYSICAL THERAPY THORACOLUMBAR EVALUATION   Patient Name: Glenda Burke MRN: [id number] DOB:02-21-1951, 72 y.o., female Today's Date: 09/03/2023  END OF SESSION:   Past Medical History:  Diagnosis Date   Allergy    Anemia    Arthritis    Cataract    Hyperlipidemia    Hypertension    Menopausal state    Past Surgical History:  Procedure Laterality Date   arm surgery Left    plate and screw   [place]ONOS[place]  ?   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   Primary osteoarthritis of both first carpometacarpal joints 08/22/2020   Normocytic anemia 03/31/2019   Polyp of colon 09/22/2018   Psoriasis 04/24/2018   Tubular adenoma of colon 01/16/2018   Diabetes mellitus type 2, diet-controlled (HCC) 12/11/2017   Primary osteoarthritis of both knees 12/11/2017   Trigger thumb of left hand 11/12/2016   Primary osteoarthritis of right knee 06/02/2015   Bilateral shoulder pain 12/07/2014   Axial Low back pain 04/23/2013   Hyperlipidemia 02/24/2013   Anemia 02/24/2013   Essential hypertension 02/18/2013   Obesity 02/18/2013   Environmental allergies 02/18/2013   Annual physical exam 02/18/2013   Chronic constipation 02/18/2013    PCP: Monica Becton, MD  REFERRING PROVIDER: Monica Becton, MD  REFERRING DIAG: Primary osteoarthritis of both knees, Chronic bilateral low back pain without sciatica  Rationale for Evaluation and Treatment: Rehabilitation  THERAPY DIAG:  No diagnosis found.  ONSET DATE: Knee: March 2023; hip: A few months prior to PT Evaluation  SUBJECTIVE:     Patient reports bilateral posterior hips and right knee have been bothering her.  Left hip is slightly worse than her right. MRI report stated that the knee is  mostly arthritis. She had PT for her knee which helped slightly, but then got injections 15 months ago which significantly helped.  She stated she has a walking pattern that appears similar to festination with long periods of walking.  MOI: None  STABILITY: Knee is getting better. She had therapy for it last year and injections for it which helped. They were in 2023  NPRS: Current: 1-2/10,  Best: 0/10, Worst: 2/10.  Pain location: Bilateral posterior hip. Sometimes it goes down to mid thigh  Pain description: Dull and achy  N/T: No  Aggravating factors: Staying in 1 position for a long period of time, walking, sitting, and standing for a long period of time  Relieving factors: lying down butterfly stretch, prone ER stretch, down-dog, bridges with feet up.   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 Before pandemic, used to walk 5 days per week.  PRECAUTIONS: Other: Limit high impact activity  Patient denies hx of cancer, stroke, seizures, lung problems, heart problems, diabetes, unexplained weight loss, unexplained changes in bowel or bladder problems, unexplained stumbling or dropping things, osteoporosis, and spinal surgery  WEIGHT BEARING RESTRICTIONS: No  FALLS:  Has patient fallen in last 6 months? No   PLOF: Independent  PATIENT GOALS: Certain types of exercises she can do to relieve pain, walk better  OBJECTIVE:  Note: Objective  measures were completed at Evaluation unless otherwise noted.  DIAGNOSTIC FINDINGS   PATIENT SURVEYS:  FOTO Will get at visit 2  COGNITION: Overall cognitive status: Within functional limits for tasks assessed     OBSERVATION   GAIT   Compensated Trendelenburg in the right   Genu valgum on the right   Mild festination   Lack of appropriate heel strike on the  right   Right toes turn in   Decreased arm swing bilaterally (R worse than L)   Decreased R. Knee extension    STANDING POSTURE   Kyphosis/FHP   Decreased WB on right side     FUNCTIONAL TEST Squat: Weight shift to left side, patient reported bilateral knee pain that went away after the squat   TRIAGE   [facility]   Right Left  Lumbar Flexion [facility]  Lumbar Extension WFL  Lumbar R. SB WFL WFL  Lumbar L. SB WFL WFL  Lumbar R. Rotation [facility] WFL  Lumbar L. Rotation [facility] WFL  Ankle DF (closed chain) Will assess at later time Will assess at later time  Knee flexion [facility]  Knee extension [facility]     Straight Leg Raise: negative bilaterally   Motion   (AROM/PROM) Right Left  Hip Flexion WFL but excessive hip ER noted [facility]  Hip Extension Limited Slightly limited  Hip ER Slightly limited Slightly limited  Hip IR Very limited Very limited  Hip ER Slightly limited Slightly limited  *Slight pain in right knee when going through right hip ER and IR  *Overall R hip motion slightly more limited than L in all directions except hip flexion.  ACCESSORY MOTION   Will assess hip accessory motions at future visit as appropriate. Spine CPAs were negative for concordant pain from lower T-Spine through the sacrum.   MMT  MMT Right Left  Hip Flexion 3/5 3/5  Knee extension 5/5 5/5  Ankle DF Will assess at future visit Will assess at future visit  Hip extension (prone) 3+/5 3+/5  Knee flexion (prone) 3+/5 3+/5  Hip ABD (s/l) 4/5 4/5  Knee Extension 5/5 5/5      SPECIAL TESTS  Resisted External Derotation: negative bilaterally   PALPATION: TTP around greater trochanter, quadratus femorous, and piriforms   FUNCTIONAL TESTS:  5 times sit to stand: 9.9 seconds   TODAY'S TREATMENT:                                                                                                                              DATE: 09/03/2023    PATIENT EDUCATION:  Person educated:  Patient Education method: Explanation Education comprehension: verbalized understanding  HOME EXERCISE PROGRAM: Will be given at visit 2  ASSESSMENT:  CLINICAL IMPRESSION: Patient is a 72 y.o. female who was seen today for physical therapy evaluation and treatment for bilateral posterior hip pain and right knee pain.  Her B (R > L) hip extension and rotation ROM is limited,  especially IR.  Her lack of ROM and weakness in her LE's is a likely contributing factor to her pain and abnormal walking pattern.  This walking pattern could be placing abnormal forces on her joints, further increasing dysfunction.  She is active most days with walking and exercise classes, which is a positive prognostic factor. Patient will benefit from ROM, strengthening, and motor control exercises to address those deficits and improve her gait mechanics. Patient would benefit from continued management of limiting condition by skilled physical therapist to address remaining impairments and functional limitations to work towards stated goals and return to PLOF or maximal functional independence.    OBJECTIVE IMPAIRMENTS: Abnormal gait, decreased ROM, decreased strength, hypomobility, impaired perceived functional ability, impaired flexibility, improper body mechanics, postural dysfunction, obesity, and pain.   ACTIVITY LIMITATIONS: sitting, standing, squatting, and stairs  PARTICIPATION LIMITATIONS: shopping and community activity  PERSONAL FACTORS: Age and 3+ comorbidities: OA, Hypertension, obesity, psoriasis  are also affecting patient's functional outcome.   REHAB POTENTIAL: Good  CLINICAL DECISION MAKING: Evolving/moderate complexity  EVALUATION COMPLEXITY: Moderate   GOALS: Goals reviewed with patient? No  SHORT TERM GOALS: Target date: 09/17/2023  Patient will be independent with initial home exercise program for self-management of symptoms. Baseline: Initial HEP to be provided at visit 2 as appropriate  (09/03/23); Goal status: INITIAL   LONG TERM GOALS: Target date: 11/26/2023  Patient will be independent with a long-term home exercise program for self-management of symptoms.  Baseline: Initial HEP to be provided at visit 2 as appropriate (09/03/23); Goal status: INITIAL  2.  Patient will demonstrate improved FOTO by equal or greater than 10 points by visit #10 to demonstrate improvement in overall condition and self-reported functional ability.  Baseline: Will assess at future visit (09/03/23); Goal status: INITIAL  3.  Patient will exhibit equal LE strength bilaterally to improve gait mechanics and increase activity tolerance.  Baseline: Strength greater on LLE (09/03/23); Goal status: INITIAL  4.  Patient will demonstrate improvement in Patient Specific Functional Scale (PSFS) of equal or greater than 3 points to reflect clinically significant improvement in patient's most valued functional activities. Baseline: Will assess at visit 2 (09/03/23); Goal status: INITIAL  PLAN:  PT FREQUENCY: 1-2x/week  PT DURATION: 12 weeks  PLANNED INTERVENTIONS: 97164- PT Re-evaluation, 97110-Therapeutic exercises, 97530- Therapeutic activity, [id number]- Neuromuscular re-education, 97140- Manual therapy, [phone]- Gait training, Patient/Family education, Balance training, Stair training, Dry Needling, Joint mobilization, Joint manipulation, Cryotherapy, and Moist heat.  PLAN FOR NEXT SESSION: Assess vitals and PSFS. Begin lower extremity strengthening and ROM exercises.   [employer], Student-PT 09/03/2023, 4:35 PM

## 2023-09-04 ENCOUNTER — Encounter: Payer: Self-pay | Admitting: Physical Therapy

## 2023-09-10 ENCOUNTER — Encounter: Payer: Self-pay | Admitting: Physical Therapy

## 2023-09-10 ENCOUNTER — Ambulatory Visit: Payer: PPO | Admitting: Physical Therapy

## 2023-09-10 DIAGNOSIS — M170 Bilateral primary osteoarthritis of knee: Secondary | ICD-10-CM

## 2023-09-10 DIAGNOSIS — M25561 Pain in right knee: Secondary | ICD-10-CM | POA: Diagnosis not present

## 2023-09-10 DIAGNOSIS — R262 Difficulty in walking, not elsewhere classified: Secondary | ICD-10-CM

## 2023-09-10 DIAGNOSIS — G8929 Other chronic pain: Secondary | ICD-10-CM

## 2023-09-10 NOTE — Therapy (Addendum)
OUTPATIENT PHYSICAL THERAPY TREATMENT    Patient Name: Glenda Burke MRN: [id number] DOB:1951-06-06, 72 y.o., female Today's Date: 09/10/2023  END OF SESSION:  PT End of Session - 09/10/23 1459     Visit Number 2   Number of Visits 17    Date for PT Re-Evaluation 11/26/23    Authorization Type [employer] reporting period from 09/03/2023    PT Start Time 1305    PT Stop Time 1347    PT Time Calculation (min) 42 min    Activity Tolerance Patient tolerated treatment well    Behavior During Therapy [facility] for tasks assessed/performed              Past Medical History:  Diagnosis Date   Allergy    Anemia    Arthritis    Cataract    Hyperlipidemia    Hypertension    Menopausal state    Past Surgical History:  Procedure Laterality Date   arm surgery Left    plate and screw   COLONOSCOPY  ?   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   Primary osteoarthritis of both first carpometacarpal joints 08/22/2020   Normocytic anemia 03/31/2019   Polyp of colon 09/22/2018   Psoriasis 04/24/2018   Tubular adenoma of colon 01/16/2018   Diabetes mellitus type 2, diet-controlled (HCC) 12/11/2017   Primary osteoarthritis of both knees 12/11/2017   Trigger thumb of left hand 11/12/2016   Primary osteoarthritis of right knee 06/02/2015   Bilateral shoulder pain 12/07/2014   Axial Low back pain 04/23/2013   Hyperlipidemia 02/24/2013   Anemia 02/24/2013   Essential hypertension 02/18/2013   Obesity 02/18/2013   Environmental allergies 02/18/2013   Annual physical exam 02/18/2013   Chronic constipation 02/18/2013    PCP: Monica Becton, MD  REFERRING PROVIDER: Monica Becton, MD  REFERRING DIAG: Primary osteoarthritis of both knees,  Chronic bilateral low back pain without sciatica  Rationale for Evaluation and Treatment: Rehabilitation  THERAPY DIAG:  Chronic pain of right knee  Difficulty in walking, not elsewhere classified  Primary osteoarthritis of both knees  ONSET DATE: Knee: March 2023; hip: A few months prior to PT Evaluation  SUBJECTIVE:       PERTINENT HISTORY:  Patient reports bilateral posterior hips and right knee have been bothering her.  Left hip is slightly worse than her right. MRI report stated that the knee is mostly arthritis. She had PT for her knee which helped slightly, but then got injections 15 months ago which significantly helped.  She stated she has a walking pattern that appears similar to festination with long periods of walking.  Patient denies hx of cancer, stroke, seizures, lung problems, heart problems, diabetes, unexplained weight loss, unexplained changes in bowel or bladder problems, unexplained stumbling or dropping things, osteoporosis, and spinal surgery  SUBJECTIVE STATEMENT: Patient reports no pain today, and she has been feeling good since the evaluation.  PAIN:  NPRS: 0/10  PRECAUTIONS: Other: Limit high impact activity  PATIENT GOALS: Certain types of exercises she can do to relieve pain, walk better  OBJECTIVE:   VITALS  BP: 120/60 HR: 70 bpm SpO2: 98%  SELF-REPORTED FUNCTION FOTO score: 52/100 (Hip questionnaire)  SELF-REPORTED FUNCTION Patient Specific Functional Scale (PSFS)  Walking: 4 Climbing stairs: 4 Standing long periods: 2 Average: 3.33   TODAY'S TREATMENT:     Vitals assessment - therex PSFS Assessment  NuStep 5 minutes Level 4: 7/10 Therex  Modified 90/90 stretch x10 each way therex  Goblet squats 2x10 with 8# water jug (pain 3-4/10) -  theract  Isometric squat holds on wedge  2x45 seconds  therex  Band walks  1:15 around knees, 2:30 around ankles therex     PATIENT EDUCATION:  Education provided: Exercise purpose/form.  Self management techniques.  Person educated: Patient Education method: Explanation Education comprehension: verbalized understanding  HOME EXERCISE PROGRAM: Access Code: [email or web address] URL: [email or web address] Date: 09/10/2023 Prepared by: Norton Blizzard  Exercises - Pigeon Pose  - 2 x daily - 7 x weekly - 1 sets - 10 reps - 5 hold - Side Stepping with Resistance at Feet  - 3-4 x weekly - 2 sets - 1:30 hold - Goblet Squat  - 3-4 x weekly - 3 sets - 10 reps - Isometric Squat with Resistance Loop  - 2-3 sets - 45 hold  ASSESSMENT:  CLINICAL IMPRESSION: Patient comes in with no hip pain, so the focus of today's session was on collecting some baseline measures and introducing the patient to some basic LE strength and ROM exercises. Patient experienced some knee pain on the squats, so volume will be monitored to prevent the pain from exceeding a 3-4/10.  Isometric squats on a wedge were added to improve endurance without aggravating her knee pain.  Patient tolerated session well, and future sessions will continue to focus on global bilateral LE strengthening. Patient would benefit from continued management of limiting condition by skilled physical therapist to address remaining impairments and functional limitations to work towards stated goals and return to PLOF or maximal functional independence.    From Initial Evaluation 09/03/2023: Patient is a 72 y.o. female who was seen today for physical therapy evaluation and treatment for bilateral posterior hip pain and right knee pain.  Her B (R > L) hip extension and rotation ROM is limited, especially IR.  Her lack of ROM and weakness in her LE's is a likely contributing factor to her pain and abnormal walking pattern.  This walking pattern could be placing abnormal forces on her joints, further increasing dysfunction.  She is active most days with walking and exercise classes, which is a positive prognostic factor. Patient will benefit from  ROM, strengthening, and motor control exercises to address those deficits and improve her gait mechanics. Patient would benefit from continued management of limiting condition by skilled physical therapist to address remaining impairments and functional limitations to work towards stated goals and return to PLOF or maximal functional independence.    OBJECTIVE IMPAIRMENTS: Abnormal gait, decreased activity tolerance, decreased endurance, difficulty walking, decreased ROM, decreased strength, hypomobility, impaired perceived functional ability, impaired flexibility, improper body mechanics, postural dysfunction, obesity, and pain.   ACTIVITY LIMITATIONS: sitting, standing, squatting, and stairs  PARTICIPATION LIMITATIONS: shopping and community activity  PERSONAL FACTORS: Age and 3+ comorbidities: OA, Hypertension, obesity, psoriasis  are also affecting patient's functional outcome.   REHAB POTENTIAL: Good  CLINICAL DECISION MAKING: Evolving/moderate complexity  EVALUATION COMPLEXITY: Moderate   GOALS: Goals reviewed with patient? No  SHORT TERM GOALS: Target date: 09/17/2023  Patient will be independent with initial home exercise program for self-management of symptoms. Baseline: Initial HEP to be provided at visit 2 as appropriate (09/03/23); Goal status: In-progress   LONG TERM GOALS: Target date: 11/26/2023  Patient will be  independent with a long-term home exercise program for self-management of symptoms.  Baseline: Initial HEP to be provided at visit 2 as appropriate (09/03/23); Goal status: In-progress  2.  Patient will demonstrate improved FOTO by equal or greater than 10 points by visit #10 to demonstrate improvement in overall condition and self-reported functional ability.  Baseline: Will assess at future visit (09/03/23); 52 at visit # 2 (09/10/2023);  Goal status: In-progress  3.  Patient will exhibit equal LE strength bilaterally to improve gait mechanics and increase  activity tolerance.  Baseline: Strength greater on LLE (09/03/23); Goal status: In-progress  4.  Patient will demonstrate improvement in Patient Specific Functional Scale (PSFS) of equal or greater than 3 points to reflect clinically significant improvement in patient's most valued functional activities. Baseline: Will assess at visit 2 (09/03/23); 3.33 (09/10/2023);  Goal status: In-progress  PLAN:  PT FREQUENCY: 1-2x/week  PT DURATION: 12 weeks  PLANNED INTERVENTIONS: 97164- PT Re-evaluation, 97110-Therapeutic exercises, 97530- Therapeutic activity, [id number]- Neuromuscular re-education, 97140- Manual therapy, [phone]- Gait training, Patient/Family education, Balance training, Stair training, Dry Needling, Joint mobilization, Joint manipulation, Cryotherapy, and Moist heat.  PLAN FOR NEXT SESSION: Assess vitals and PSFS. Begin lower extremity strengthening and ROM exercises.  Kaeleb Emond, [place], Student-PT    [place]. Ilsa Iha, PT, DPT 09/10/23, 3:58 PM  [facility] [address] [place], [place] [address] P: [phone] I F: [phone]

## 2023-09-29 ENCOUNTER — Encounter: Payer: Self-pay | Admitting: Physical Therapy

## 2023-09-29 ENCOUNTER — Ambulatory Visit: Payer: PPO | Attending: Sports Medicine | Admitting: Physical Therapy

## 2023-09-29 DIAGNOSIS — G8929 Other chronic pain: Secondary | ICD-10-CM | POA: Insufficient documentation

## 2023-09-29 DIAGNOSIS — R262 Difficulty in walking, not elsewhere classified: Secondary | ICD-10-CM | POA: Diagnosis present

## 2023-09-29 DIAGNOSIS — M170 Bilateral primary osteoarthritis of knee: Secondary | ICD-10-CM | POA: Diagnosis present

## 2023-09-29 DIAGNOSIS — M25561 Pain in right knee: Secondary | ICD-10-CM | POA: Diagnosis present

## 2023-09-29 NOTE — Therapy (Signed)
OUTPATIENT PHYSICAL THERAPY TREATMENT    Patient Name: Glenda Burke MRN: [id number] DOB:Apr 13, 1951, 72 y.o., female Today's Date: 09/29/2023  END OF SESSION:  PT End of Session - 09/10/23 1459     Visit Number 2   Number of Visits 17    Date for PT Re-Evaluation 11/26/23    Authorization Type [employer] reporting period from 09/03/2023    PT Start Time 1305    PT Stop Time 1347    PT Time Calculation (min) 42 min    Activity Tolerance Patient tolerated treatment well    Behavior During Therapy [facility] for tasks assessed/performed              Past Medical History:  Diagnosis Date   Allergy    Anemia    Arthritis    Cataract    Hyperlipidemia    Hypertension    Menopausal state    Past Surgical History:  Procedure Laterality Date   arm surgery Left    plate and screw   [place]ONOS[place]  ?   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   Primary osteoarthritis of both first carpometacarpal joints 08/22/2020   Normocytic anemia 03/31/2019   Polyp of colon 09/22/2018   Psoriasis 04/24/2018   Tubular adenoma of colon 01/16/2018   Diabetes mellitus type 2, diet-controlled (HCC) 12/11/2017   Primary osteoarthritis of both knees 12/11/2017   Trigger thumb of left hand 11/12/2016   Primary osteoarthritis of right knee 06/02/2015   Bilateral shoulder pain 12/07/2014   Axial Low back pain 04/23/2013   Hyperlipidemia 02/24/2013   Anemia 02/24/2013   Essential hypertension 02/18/2013   Obesity 02/18/2013   Environmental allergies 02/18/2013   Annual physical exam 02/18/2013   Chronic constipation 02/18/2013    PCP: Monica Becton, MD  REFERRING PROVIDER: Monica Becton, MD  REFERRING DIAG: Primary osteoarthritis of both knees,  Chronic bilateral low back pain without sciatica  Rationale for Evaluation and Treatment: Rehabilitation  THERAPY DIAG:  Chronic pain of right knee  Difficulty in walking, not elsewhere classified  ONSET DATE: Knee: March 2023; hip: A few months prior to PT Evaluation  SUBJECTIVE:     PERTINENT HISTORY:  Patient denies hx of cancer, stroke, seizures, lung problems, heart problems, diabetes, unexplained weight loss, unexplained changes in bowel or bladder problems, unexplained stumbling or dropping things, osteoporosis, and spinal surgery  SUBJECTIVE STATEMENT: Patient states she just came back from [place] (for 2 weeks) where she walked a lot in the cold and her right knee is bothering her and slightly swollen. She did not get to do her HEP while she was so busy with walking, hiking, and taking care of kids. She ended up doing more activity than usual. She states her hip has not been feeling too bad .  PAIN:  NPRS: 3/10 across of anterior R knee when she does squats.   PRECAUTIONS: Other: Limit high impact activity  PATIENT GOALS: Certain types of exercises she can do to relieve pain, walk better  OBJECTIVE:    TODAY'S TREATMENT:     Therapeutic exercise: to centralize symptoms and improve ROM, strength, muscular endurance, and activity tolerance required for successful completion of functional activities.   - NuStep level 4 using bilateral upper  and lower extremities. Seat/handle setting 7/10. For improved extremity mobility, muscular endurance, and activity tolerance; and to induce the analgesic effect of aerobic exercise, stimulate improved joint nutrition, and prepare body structures and systems for following interventions. x 5  minutes. Average SPM = 108. (Manual therapy - see below) - seated knee extension LAQ, 1x10 with 10# left, 3x20/25/20 R side with 08/03/19#AW.   Supine 90/90 stretch with towel x10  5 second holds R LE  Band walks  2x30 feet each direction, GTB around  ankles   Manual therapy: to reduce pain and tissue tension, improve range of motion, neuromodulation, in order to promote improved ability to complete functional activities. - supine R knee physiologic grade III extension 3x30 seconds mixed with grade IV end range extension mobilization 3x30 seconds.  - R patellar mobs with towel roll behind knee, grade II-IV 30 seconds caudal, medial, and lateral - STM to R quad with "the stick" IASTM with towel roll behind knee.   PATIENT EDUCATION:  Education provided: Exercise purpose/form. Self management techniques.  Person educated: Patient Education method: Explanation Education comprehension: verbalized understanding  HOME EXERCISE PROGRAM: Access Code: [email or web address] URL: [email or web address] Date: 09/10/2023 Prepared by: Norton Blizzard  Exercises - Pigeon Pose  - 2 x daily - 7 x weekly - 1 sets - 10 reps - 5 hold - Side Stepping with Resistance at Feet  - 3-4 x weekly - 2 sets - 1:30 hold - Goblet Squat  - 3-4 x weekly - 3 sets - 10 reps - Isometric Squat with Resistance Loop  - 2-3 sets - 45 hold  ASSESSMENT:  CLINICAL IMPRESSION: Patient arrives with right knee swelling and discomfort after doing a lot of walking and climbing in her recent trip to [place]. She felt much better after manual therapy and was able to tolerate exercises with no lasting increase in pain. She was able to easily perform quad extension exercises and would benefit from increased load next session. Patient would benefit from continued management of limiting condition by skilled physical therapist to address remaining impairments and functional limitations to work towards stated goals and return to PLOF or maximal functional independence.   From Initial Evaluation 09/03/2023: Patient is a 72 y.o. female who was seen today for physical therapy evaluation and treatment for bilateral posterior hip pain and right knee pain.  Her B (R > L) hip extension and rotation  ROM is limited, especially IR.  Her lack of ROM and weakness in her LE's is a likely contributing factor to her pain and abnormal walking pattern.  This walking pattern could be placing abnormal forces on her joints, further increasing dysfunction.  She is active most days with walking and exercise classes, which is a positive prognostic factor. Patient will benefit from ROM, strengthening, and motor control exercises to address those deficits and improve her gait mechanics. Patient would benefit from continued management of limiting condition by skilled physical therapist to address remaining impairments and functional limitations to work towards stated goals and return to PLOF or maximal functional independence.    OBJECTIVE IMPAIRMENTS: Abnormal gait, decreased activity tolerance, decreased endurance, difficulty walking, decreased ROM, decreased strength, hypomobility, impaired perceived functional ability, impaired flexibility, improper body mechanics, postural dysfunction, obesity, and pain.   ACTIVITY LIMITATIONS: sitting, standing, squatting, and stairs  PARTICIPATION LIMITATIONS: shopping and community activity  PERSONAL FACTORS: Age and 3+ comorbidities: OA, Hypertension, obesity, psoriasis  are also affecting patient's functional outcome.   REHAB POTENTIAL: Good  CLINICAL DECISION MAKING: Evolving/moderate complexity  EVALUATION COMPLEXITY: Moderate   GOALS: Goals reviewed with patient? No  SHORT TERM GOALS: Target date: 09/17/2023  Patient will be independent with initial home exercise program for self-management of symptoms. Baseline: Initial HEP to be provided at visit 2 as appropriate (09/03/23); Goal status: In-progress   LONG TERM GOALS: Target date: 11/26/2023  Patient will be independent with a long-term home exercise program for self-management of symptoms.  Baseline: Initial HEP to be provided at visit 2 as appropriate (09/03/23); Goal status: In-progress  2.   Patient will demonstrate improved FOTO by equal or greater than 10 points by visit #10 to demonstrate improvement in overall condition and self-reported functional ability.  Baseline: Will assess at future visit (09/03/23); 52 at visit # 2 (09/10/2023);  Goal status: In-progress  3.  Patient will exhibit equal LE strength bilaterally to improve gait mechanics and increase activity tolerance.  Baseline: Strength greater on LLE (09/03/23); Goal status: In-progress  4.  Patient will demonstrate improvement in Patient Specific Functional Scale (PSFS) of equal or greater than 3 points to reflect clinically significant improvement in patient's most valued functional activities. Baseline: Will assess at visit 2 (09/03/23); 3.33 (09/10/2023);  Goal status: In-progress  PLAN:  PT FREQUENCY: 1-2x/week  PT DURATION: 12 weeks  PLANNED INTERVENTIONS: 97164- PT Re-evaluation, 97110-Therapeutic exercises, 97530- Therapeutic activity, [id number]- Neuromuscular re-education, 97140- Manual therapy, [phone]- Gait training, Patient/Family education, Balance training, Stair training, Dry Needling, Joint mobilization, Joint manipulation, Cryotherapy, and Moist heat.  PLAN FOR NEXT SESSION: Assess vitals and PSFS. Begin lower extremity strengthening and ROM exercises.  Glenda Burke. Ilsa Iha, PT, DPT 09/29/23, 6:59 PM  [facility] [address] [place], [place] [address] P: [phone] I F: [phone]  Addendum 10/02/2023 to correct errors in signagure (student did not participate in care) and manual interventions.  Glenda Burke. Ilsa Iha, PT, DPT 10/02/23, 1:29 PM

## 2023-10-01 ENCOUNTER — Encounter: Payer: Self-pay | Admitting: Physical Therapy

## 2023-10-01 ENCOUNTER — Ambulatory Visit: Payer: PPO | Admitting: Physical Therapy

## 2023-10-01 DIAGNOSIS — M170 Bilateral primary osteoarthritis of knee: Secondary | ICD-10-CM

## 2023-10-01 DIAGNOSIS — M25561 Pain in right knee: Secondary | ICD-10-CM | POA: Diagnosis not present

## 2023-10-01 DIAGNOSIS — R262 Difficulty in walking, not elsewhere classified: Secondary | ICD-10-CM

## 2023-10-01 DIAGNOSIS — G8929 Other chronic pain: Secondary | ICD-10-CM

## 2023-10-01 NOTE — Therapy (Signed)
OUTPATIENT PHYSICAL THERAPY TREATMENT    Patient Name: Glenda Burke MRN: [id number] DOB:May 29, 1951, 72 y.o., female Today's Date: 10/02/2023  END OF SESSION:  PT End of Session - 09/10/23 1459     Visit Number 2   Number of Visits 17    Date for PT Re-Evaluation 11/26/23    Authorization Type [employer] reporting period from 09/03/2023    PT Start Time 1305    PT Stop Time 1347    PT Time Calculation (min) 42 min    Activity Tolerance Patient tolerated treatment well    Behavior During Therapy [facility] for tasks assessed/performed              Past Medical History:  Diagnosis Date   Allergy    Anemia    Arthritis    Cataract    Hyperlipidemia    Hypertension    Menopausal state    Past Surgical History:  Procedure Laterality Date   arm surgery Left    plate and screw   COLONOSCOPY  ?   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   Primary osteoarthritis of both first carpometacarpal joints 08/22/2020   Normocytic anemia 03/31/2019   Polyp of colon 09/22/2018   Psoriasis 04/24/2018   Tubular adenoma of colon 01/16/2018   Diabetes mellitus type 2, diet-controlled (HCC) 12/11/2017   Primary osteoarthritis of both knees 12/11/2017   Trigger thumb of left hand 11/12/2016   Primary osteoarthritis of right knee 06/02/2015   Bilateral shoulder pain 12/07/2014   Axial Low back pain 04/23/2013   Hyperlipidemia 02/24/2013   Anemia 02/24/2013   Essential hypertension 02/18/2013   Obesity 02/18/2013   Environmental allergies 02/18/2013   Annual physical exam 02/18/2013   Chronic constipation 02/18/2013    PCP: Monica Becton, MD  REFERRING PROVIDER: Monica Becton, MD  REFERRING DIAG: Primary osteoarthritis of both knees,  Chronic bilateral low back pain without sciatica  Rationale for Evaluation and Treatment: Rehabilitation  THERAPY DIAG:  Chronic pain of right knee  Difficulty in walking, not elsewhere classified  Primary osteoarthritis of both knees  ONSET DATE: Knee: March 2023; hip: A few months prior to PT Evaluation  SUBJECTIVE:     PERTINENT HISTORY:  Patient denies hx of cancer, stroke, seizures, lung problems, heart problems, diabetes, unexplained weight loss, unexplained changes in bowel or bladder problems, unexplained stumbling or dropping things, osteoporosis, and spinal surgery  SUBJECTIVE STATEMENT: Patient states she walked a lot again today. She states her knee felt a lot better after last PT session.   PAIN:  NPRS: 2/10 right knee   PRECAUTIONS: Other: Limit high impact activity  PATIENT GOALS: Certain types of exercises she can do to relieve pain, walk better  OBJECTIVE:   1RM (calculated):  Double leg knee extension on Omega machine: 46.7#  TODAY'S TREATMENT:     Therapeutic exercise: to centralize symptoms and improve ROM, strength, muscular endurance, and activity tolerance required for successful completion of functional activities.  - NuStep level 5 using bilateral upper and lower extremities. Seat/handle setting 7/10. For improved extremity mobility, muscular endurance, and activity tolerance; and to induce the analgesic effect of aerobic exercise, stimulate improved joint nutrition, and prepare body structures and systems for following interventions. x 5  minutes. Average SPM = 109.  (Manual therapy - see below)  1RM testing:   Double leg knee extension on OMEGA:  1x10 at 35# 1x1 at 45# 1x5 at 40# Double leg hamstring curl on OMEGA:  1x10 at 35# 1x6 at 45# (incomplete ROM, 55# too hard).  1x10 at 40 (poor mechanics from patient's proportions not fitting well in machine).   Double leg knee extension on OMEGA: 1x20 at 28# (~60% 1RM).   Manual therapy: to  reduce pain and tissue tension, improve range of motion, neuromodulation, in order to promote improved ability to complete functional activities. SUPINE - R knee physiologic grade III extension 3x30 seconds mixed with grade IV end range extension mobilization 3x30 seconds.  - R patellar mobs with towel roll behind knee, grade II-IV 30 seconds caudal, medial, cephalic, and lateral - STM to R quad with "the stick" IASTM with towel roll behind knee.    PATIENT EDUCATION:  Education provided: Exercise purpose/form. Self management techniques.  Person educated: Patient Education method: Explanation Education comprehension: verbalized understanding  HOME EXERCISE PROGRAM: Access Code: [email or web address] URL: [email or web address] Date: 09/10/2023 Prepared by: Norton Blizzard  Exercises - Pigeon Pose  - 2 x daily - 7 x weekly - 1 sets - 10 reps - 5 hold - Side Stepping with Resistance at Feet  - 3-4 x weekly - 2 sets - 1:30 hold - Goblet Squat  - 3-4 x weekly - 3 sets - 10 reps - Isometric Squat with Resistance Loop  - 2-3 sets - 45 hold  ASSESSMENT:  CLINICAL IMPRESSION: Patient arrives reporting improved R knee pain after last PT session. Continued with similar interventions this session but increased loading to the right quads and hamstrings to improve active support of knee joint. Attempted to measure calculated 1RM for knee extension and hamstrings on OMEGA machine with good tolerance. Was successful obtaining 1RM with knee extension, but patient was limited by poor mechanics from less than ideal body size for machine for hamstring curl. May benefit from attempt to find 1RM for individual LE for these exercises next session. Patient curious about when she will know if she needs a TKA and PT provided education about this. Patient would benefit from continued management of limiting condition by skilled physical therapist to address remaining impairments and functional limitations to work  towards stated goals and return to PLOF or maximal functional independence.   From Initial Evaluation 09/03/2023: Patient is a 71 y.o. female who was seen today for physical therapy evaluation and treatment for bilateral posterior hip pain and right knee pain.  Her B (R > L) hip extension and rotation ROM is limited, especially IR.  Her lack of ROM and weakness in her LE's is a likely contributing factor to her pain and abnormal walking pattern.  This walking pattern could be placing abnormal forces on her joints, further increasing dysfunction.  She is active most days with walking and exercise classes, which is a positive prognostic factor. Patient will benefit from ROM, strengthening, and motor control exercises to address those deficits and improve her gait mechanics. Patient would benefit from continued management of limiting condition by skilled physical therapist to address remaining impairments and functional limitations to work towards stated goals and return to PLOF or maximal functional independence.    OBJECTIVE IMPAIRMENTS: Abnormal gait, decreased activity tolerance, decreased endurance, difficulty walking, decreased ROM, decreased strength, hypomobility, impaired perceived functional ability, impaired flexibility, improper body mechanics, postural dysfunction, obesity, and pain.   ACTIVITY LIMITATIONS: sitting, standing,  squatting, and stairs  PARTICIPATION LIMITATIONS: shopping and community activity  PERSONAL FACTORS: Age and 3+ comorbidities: OA, Hypertension, obesity, psoriasis  are also affecting patient's functional outcome.   REHAB POTENTIAL: Good  CLINICAL DECISION MAKING: Evolving/moderate complexity  EVALUATION COMPLEXITY: Moderate   GOALS: Goals reviewed with patient? No  SHORT TERM GOALS: Target date: 09/17/2023  Patient will be independent with initial home exercise program for self-management of symptoms. Baseline: Initial HEP to be provided at visit 2 as  appropriate (09/03/23); Goal status: In-progress   LONG TERM GOALS: Target date: 11/26/2023  Patient will be independent with a long-term home exercise program for self-management of symptoms.  Baseline: Initial HEP to be provided at visit 2 as appropriate (09/03/23); Goal status: In-progress  2.  Patient will demonstrate improved FOTO by equal or greater than 10 points by visit #10 to demonstrate improvement in overall condition and self-reported functional ability.  Baseline: Will assess at future visit (09/03/23); 52 at visit # 2 (09/10/2023);  Goal status: In-progress  3.  Patient will exhibit equal LE strength bilaterally to improve gait mechanics and increase activity tolerance.  Baseline: Strength greater on LLE (09/03/23); Goal status: In-progress  4.  Patient will demonstrate improvement in Patient Specific Functional Scale (PSFS) of equal or greater than 3 points to reflect clinically significant improvement in patient's most valued functional activities. Baseline: Will assess at visit 2 (09/03/23); 3.33 (09/10/2023);  Goal status: In-progress  PLAN:  PT FREQUENCY: 1-2x/week  PT DURATION: 12 weeks  PLANNED INTERVENTIONS: 97164- PT Re-evaluation, 97110-Therapeutic exercises, 97530- Therapeutic activity, [id number]- Neuromuscular re-education, 97140- Manual therapy, [phone]- Gait training, Patient/Family education, Balance training, Stair training, Dry Needling, Joint mobilization, Joint manipulation, Cryotherapy, and Moist heat.  PLAN FOR NEXT SESSION: Assess vitals and PSFS. Begin lower extremity strengthening and ROM exercises.  Luretha Murphy. Ilsa Iha, PT, DPT 10/02/23, 1:32 PM  [facility] [address] [place], [place] [address] P: [phone] I F: [phone]

## 2023-10-13 ENCOUNTER — Encounter: Payer: PPO | Admitting: Physical Therapy

## 2023-10-16 ENCOUNTER — Encounter: Payer: PPO | Admitting: Physical Therapy

## 2023-10-20 ENCOUNTER — Ambulatory Visit: Payer: PPO | Attending: Sports Medicine | Admitting: Physical Therapy

## 2023-10-20 DIAGNOSIS — M170 Bilateral primary osteoarthritis of knee: Secondary | ICD-10-CM | POA: Diagnosis present

## 2023-10-20 DIAGNOSIS — R262 Difficulty in walking, not elsewhere classified: Secondary | ICD-10-CM | POA: Diagnosis present

## 2023-10-20 DIAGNOSIS — G8929 Other chronic pain: Secondary | ICD-10-CM

## 2023-10-20 DIAGNOSIS — M25561 Pain in right knee: Secondary | ICD-10-CM | POA: Insufficient documentation

## 2023-10-20 NOTE — Therapy (Signed)
 OUTPATIENT PHYSICAL THERAPY TREATMENT    Patient Name: Glenda Burke MRN: [id number] DOB:1951/02/13, 73 y.o., female Today's Date: 10/21/2023  END OF SESSION:  PT End of Session - 10/20/23 1352     Visit Number 5    Number of Visits 17    Date for PT Re-Evaluation 11/26/23    Authorization Type [employer] reporting period from 09/03/2023    Progress Note Due on Visit 10    PT Start Time 1354    PT Stop Time 1430    PT Time Calculation (min) 36 min    Activity Tolerance Patient tolerated treatment well    Behavior During Therapy [facility] for tasks assessed/performed              Past Medical History:  Diagnosis Date   Allergy    Anemia    Arthritis    Cataract    Hyperlipidemia    Hypertension    Menopausal state    Past Surgical History:  Procedure Laterality Date   arm surgery Left    plate and screw   COLONOSCOPY  ?   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   Primary osteoarthritis of both first carpometacarpal joints 08/22/2020   Normocytic anemia 03/31/2019   Polyp of colon 09/22/2018   Psoriasis 04/24/2018   Tubular adenoma of colon 01/16/2018   Diabetes mellitus type 2, diet-controlled (HCC) 12/11/2017   Primary osteoarthritis of both knees 12/11/2017   Trigger thumb of left hand 11/12/2016   Primary osteoarthritis of right knee 06/02/2015   Bilateral shoulder pain 12/07/2014   Axial Low back pain 04/23/2013   Hyperlipidemia 02/24/2013   Anemia 02/24/2013   Essential hypertension 02/18/2013   Obesity 02/18/2013   Environmental allergies 02/18/2013   Annual physical exam 02/18/2013   Chronic constipation 02/18/2013    PCP: Curtis Debby PARAS, MD  REFERRING PROVIDER: Curtis Debby PARAS, MD  REFERRING DIAG: Primary  osteoarthritis of both knees, Chronic bilateral low back pain without sciatica  Rationale for Evaluation and Treatment: Rehabilitation  THERAPY DIAG:  Chronic pain of right knee  Difficulty in walking, not elsewhere classified  Primary osteoarthritis of both knees  ONSET DATE: Knee: March 2023; hip: A few months prior to PT Evaluation  SUBJECTIVE:     PERTINENT HISTORY:  Patient denies hx of cancer, stroke, seizures, lung problems, heart problems, diabetes, unexplained weight loss, unexplained changes in bowel or bladder problems, unexplained stumbling or dropping things, osteoporosis, and spinal surgery  SUBJECTIVE STATEMENT: Patient states since last PT session her husband was in the hospital with an unplanned illness. She states it was a lot of stress and she was not eating well or exercising. Her husband is home now. She is having pain here and there, but nothing she cannot tolerate. She did not do much exercise, except walking a lot. Her husband has a lot of appointments coming up, which meant she had to cancel her next PT appointment later in the week.   PAIN:  NPRS: 0/10  PRECAUTIONS: Other: Limit high impact activity  PATIENT GOALS: Certain types of exercises she can do to relieve pain, walk better  OBJECTIVE:   1RM (calculated):  Double leg knee extension on Omega machine: 46.7# (last tested 10/01/2023);  Double leg  hamstring curl: 54# (last estimated 10/01/2023);   TODAY'S TREATMENT:     Therapeutic exercise: to centralize symptoms and improve ROM, strength, muscular endurance, and activity tolerance required for successful completion of functional activities.  - NuStep level 5 using bilateral upper and lower extremities. Seat/handle setting 7/10. For improved extremity mobility, muscular endurance, and activity tolerance; and to induce the analgesic effect of aerobic exercise, stimulate improved joint nutrition, and prepare body structures and systems for following  interventions. x 5:40  minutes. Average SPM = 108.  Superset:  Double leg knee extension on OMEGA:  3x20 at 28# (~60% 1RM, incomplete ROM) Double leg hamstring curl on OMEGA:  3x20 at 32.5# (~ 60% 1RM)  Therapeutic activities: dynamic activities for functional strengthening and improved functional activity tolerance.  Kickstand deadlift with single KB in CL UE from 4 inch KB 3x20 each side with 10#KB  Pt required multimodal cuing for proper technique and to facilitate improved neuromuscular control, strength, range of motion, and functional ability resulting in improved performance and form.  PATIENT EDUCATION:  Education provided: Exercise purpose/form. Self management techniques.  Person educated: Patient Education method: Explanation Education comprehension: verbalized understanding  HOME EXERCISE PROGRAM: Access Code: [email or web address] URL: [email or web address] Date: 09/10/2023 Prepared by: Camie Cleverly  Exercises - Pigeon Pose  - 2 x daily - 7 x weekly - 1 sets - 10 reps - 5 hold - Side Stepping with Resistance at Feet  - 3-4 x weekly - 2 sets - 1:30 hold - Goblet Squat  - 3-4 x weekly - 3 sets - 10 reps - Isometric Squat with Resistance Loop  - 2-3 sets - 45 hold  ASSESSMENT:  CLINICAL IMPRESSION: Patient returns to PT after almost 3 weeks away during the holidays. She continues to have intermittent knee and hip pain, with the right knee being most bothersome and affecting her gait most. She has been out of her usual exercise routine and today's session focused on returning to quad and hamstring strengthening as well as progressing hip exercises. She tolerated exercises well overall but needed a lot of cuing to perform staggered stance deadlift with correct form. This exercise was added to HEP, but she would benefit from review and refinement of technique at next session. Patient would benefit from continued management of limiting condition by skilled physical therapist  to address remaining impairments and functional limitations to work towards stated goals and return to PLOF or maximal functional independence.   From Initial Evaluation 09/03/2023: Patient is a 73 y.o. female who was seen today for physical therapy evaluation and treatment for bilateral posterior hip pain and right knee pain.  Her B (R > L) hip extension and rotation ROM is limited, especially IR.  Her lack of ROM and weakness in her LE's is a likely contributing factor to her pain and abnormal walking pattern.  This walking pattern could be placing abnormal forces on her joints, further increasing dysfunction.  She is active most days with walking and exercise classes, which is a positive prognostic factor. Patient will benefit from ROM, strengthening, and motor control exercises to address those deficits and improve her gait mechanics. Patient would benefit from continued management of limiting condition by skilled physical therapist to address remaining impairments and functional limitations to work towards stated goals and return to PLOF or maximal functional independence.    OBJECTIVE IMPAIRMENTS: Abnormal gait, decreased activity tolerance, decreased endurance, difficulty walking, decreased ROM, decreased strength, hypomobility, impaired perceived functional ability, impaired flexibility, improper body mechanics, postural dysfunction,  obesity, and pain.   ACTIVITY LIMITATIONS: sitting, standing, squatting, and stairs  PARTICIPATION LIMITATIONS: shopping and community activity  PERSONAL FACTORS: Age and 3+ comorbidities: OA, Hypertension, obesity, psoriasis  are also affecting patient's functional outcome.   REHAB POTENTIAL: Good  CLINICAL DECISION MAKING: Evolving/moderate complexity  EVALUATION COMPLEXITY: Moderate   GOALS: Goals reviewed with patient? No  SHORT TERM GOALS: Target date: 09/17/2023  Patient will be independent with initial home exercise program for self-management of  symptoms. Baseline: Initial HEP to be provided at visit 2 as appropriate (09/03/23); Goal status: In-progress   LONG TERM GOALS: Target date: 11/26/2023  Patient will be independent with a long-term home exercise program for self-management of symptoms.  Baseline: Initial HEP to be provided at visit 2 as appropriate (09/03/23); Goal status: In-progress  2.  Patient will demonstrate improved FOTO by equal or greater than 10 points by visit #10 to demonstrate improvement in overall condition and self-reported functional ability.  Baseline: Will assess at future visit (09/03/23); 52 at visit # 2 (09/10/2023);  Goal status: In-progress  3.  Patient will exhibit equal LE strength bilaterally to improve gait mechanics and increase activity tolerance.  Baseline: Strength greater on LLE (09/03/23); Goal status: In-progress  4.  Patient will demonstrate improvement in Patient Specific Functional Scale (PSFS) of equal or greater than 3 points to reflect clinically significant improvement in patient's most valued functional activities. Baseline: Will assess at visit 2 (09/03/23); 3.33 (09/10/2023);  Goal status: In-progress  PLAN:  PT FREQUENCY: 1-2x/week  PT DURATION: 12 weeks  PLANNED INTERVENTIONS: 97164- PT Re-evaluation, 97110-Therapeutic exercises, 97530- Therapeutic activity, [id number]- Neuromuscular re-education, 97140- Manual therapy, [phone]- Gait training, Patient/Family education, Balance training, Stair training, Dry Needling, Joint mobilization, Joint manipulation, Cryotherapy, and Moist heat.  PLAN FOR NEXT SESSION: Update HEP as appropriate, progressive LE/functional  strengthening and balance exercises as tolerated. Consider joint mobilizations and self-mobilization/ROM exercises for hips and knee next session.   Camie SAUNDERS. Juli, PT, DPT 10/21/23, 2:12 PM  [facility] [address] [place], [place] [address] P: [phone] I F: [phone]

## 2023-10-21 ENCOUNTER — Encounter: Payer: Self-pay | Admitting: Physical Therapy

## 2023-10-22 ENCOUNTER — Ambulatory Visit: Payer: PPO | Admitting: Physical Therapy

## 2023-10-27 ENCOUNTER — Ambulatory Visit: Payer: PPO | Admitting: Physical Therapy

## 2023-10-27 DIAGNOSIS — R262 Difficulty in walking, not elsewhere classified: Secondary | ICD-10-CM

## 2023-10-27 DIAGNOSIS — G8929 Other chronic pain: Secondary | ICD-10-CM

## 2023-10-27 DIAGNOSIS — M170 Bilateral primary osteoarthritis of knee: Secondary | ICD-10-CM

## 2023-10-27 DIAGNOSIS — M25561 Pain in right knee: Secondary | ICD-10-CM | POA: Diagnosis not present

## 2023-10-27 NOTE — Therapy (Signed)
 OUTPATIENT PHYSICAL THERAPY TREATMENT    Patient Name: Glenda Burke MRN: [id number] DOB:May 29, 1951, 73 y.o., female Today's Date: 10/28/2023  END OF SESSION:  PT End of Session - 10/28/23 0922     Visit Number 6    Number of Visits 17    Date for PT Re-Evaluation 11/26/23    Authorization Type [employer] reporting period from 09/03/2023    Progress Note Due on Visit 10    PT Start Time 1435    PT Stop Time 1515    PT Time Calculation (min) 40 min    Activity Tolerance Patient tolerated treatment well    Behavior During Therapy [facility] for tasks assessed/performed               Past Medical History:  Diagnosis Date   Allergy    Anemia    Arthritis    Cataract    Hyperlipidemia    Hypertension    Menopausal state    Past Surgical History:  Procedure Laterality Date   arm surgery Left    plate and screw   COLONOSCOPY  ?   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   Primary osteoarthritis of both first carpometacarpal joints 08/22/2020   Normocytic anemia 03/31/2019   Polyp of colon 09/22/2018   Psoriasis 04/24/2018   Tubular adenoma of colon 01/16/2018   Diabetes mellitus type 2, diet-controlled (HCC) 12/11/2017   Primary osteoarthritis of both knees 12/11/2017   Trigger thumb of left hand 11/12/2016   Primary osteoarthritis of right knee 06/02/2015   Bilateral shoulder pain 12/07/2014   Axial Low back pain 04/23/2013   Hyperlipidemia 02/24/2013   Anemia 02/24/2013   Essential hypertension 02/18/2013   Obesity 02/18/2013   Environmental allergies 02/18/2013   Annual physical exam 02/18/2013   Chronic constipation 02/18/2013    PCP: Curtis Debby PARAS, MD  REFERRING PROVIDER: Curtis Debby PARAS, MD  REFERRING DIAG: Primary  osteoarthritis of both knees, Chronic bilateral low back pain without sciatica  Rationale for Evaluation and Treatment: Rehabilitation  THERAPY DIAG:  Chronic pain of right knee  Difficulty in walking, not elsewhere classified  Primary osteoarthritis of both knees  ONSET DATE: Knee: March 2023; hip: A few months prior to PT Evaluation  SUBJECTIVE:     PERTINENT HISTORY:  Patient denies hx of cancer, stroke, seizures, lung problems, heart problems, diabetes, unexplained weight loss, unexplained changes in bowel or bladder problems, unexplained stumbling or dropping things, osteoporosis, and spinal surgery  SUBJECTIVE STATEMENT: Patient states it was terrible after last PT session. She states she would not have been able to come back for another PT visit later in the week if she had been able to. She states she went with her husband to a doctor's appointment and told him about her response to PT and he suggested she up her arthritis medication for a few days. She did this and it helped. She is now back to her normal dose. She states she had muscle pain and joint pain in right knee and B hips. She is feeling okay now with no pain, but thinks she did too much last PT session.   PAIN:  NPRS: 0/10  PRECAUTIONS: Other: Limit high impact activity  PATIENT GOALS: Certain types of  exercises she can do to relieve pain, walk better  OBJECTIVE:   1RM (calculated):  Double leg knee extension on Omega machine: 46.7# (last tested 10/01/2023);  Double leg hamstring curl: 54# (last estimated 10/01/2023);    Range of Motion   (AROM/PROM) Right Left  Hip Flexion 119/125 (soft end feel) WFL (soft end feel)  Hip Extension 13/15 11/15  Hip ER  32/38 44/49  Hip IR /31 /19     TODAY'S TREATMENT:     Therapeutic exercise: to centralize symptoms and improve ROM, strength, muscular endurance, and activity tolerance required for successful completion of functional activities.  NuStep level 5  using bilateral upper and lower extremities. Seat/handle setting 7/10. For improved extremity mobility, muscular endurance, and activity tolerance; and to induce the analgesic effect of aerobic exercise, stimulate improved joint nutrition, and prepare body structures and systems for following interventions. x 5  minutes. Average SPM = 105.  Hooklying AROM measurements (see above) (Theract and manual - see below)   Double leg knee extension on OMEGA, seat position 1:  1x20 at 28# (~60% 1RM) Double leg hamstring curl on OMEGA:  1x20 at 32.5# (~ 60% 1RM)  Therapeutic activities: dynamic activities for functional strengthening and improved functional activity tolerance.  Kickstand deadlift with single KB in CL UE from 4 inch KB 2x15 each side with 10#KB, dynadisc under back foot first set  Manual therapy: to reduce pain and tissue tension, improve range of motion, neuromodulation, in order to promote improved ability to complete functional activities. PRONE  PROM hip measurements (see above) HOOKLYING PROM measurements to assess which mobilization is needed (see above) R hip LAD 3x30 seconds, grade III-IV with mob belt  Pt required multimodal cuing for proper technique and to facilitate improved neuromuscular control, strength, range of motion, and functional ability resulting in improved performance and form.  PATIENT EDUCATION:  Education provided: Exercise purpose/form. Self management techniques.  Person educated: Patient Education method: Explanation Education comprehension: verbalized understanding  HOME EXERCISE PROGRAM: Access Code: [email or web address] URL: [email or web address] Date: 09/10/2023 Prepared by: Camie Cleverly  Exercises - Pigeon Pose  - 2 x daily - 7 x weekly - 1 sets - 10 reps - 5 hold - Side Stepping with Resistance at Feet  - 3-4 x weekly - 2 sets - 1:30 hold - Goblet Squat  - 3-4 x weekly - 3 sets - 10 reps - Isometric Squat with Resistance Loop  - 2-3  sets - 45 hold  ASSESSMENT:  CLINICAL IMPRESSION: Patient returns with report of poor tolerance to exercises after last PT session with elevated joint pain in R knee and B hips. She arrived feeling good today and continued with strengthening exercises with reduced volume to prevent excessive joint pain. Hip ROM further assessed today to determine which joint mobilizations would be most useful. Patient's ROM slightly limited globally at B hips, but more uncomfortable at the right hip compared to the left. R LAD performed to help with pain. She reported no increase in pain by end of session and tolerated interventions well. Patient would benefit from continued management of limiting condition by skilled physical therapist to address remaining impairments and functional limitations to work towards stated goals and return to PLOF or maximal functional independence.   From Initial Evaluation 09/03/2023: Patient is a 73 y.o. female who was seen today for physical therapy evaluation and treatment for bilateral posterior hip pain and right knee pain.  Her B (R > L) hip extension and rotation ROM is limited, especially  IR.  Her lack of ROM and weakness in her LE's is a likely contributing factor to her pain and abnormal walking pattern.  This walking pattern could be placing abnormal forces on her joints, further increasing dysfunction.  She is active most days with walking and exercise classes, which is a positive prognostic factor. Patient will benefit from ROM, strengthening, and motor control exercises to address those deficits and improve her gait mechanics. Patient would benefit from continued management of limiting condition by skilled physical therapist to address remaining impairments and functional limitations to work towards stated goals and return to PLOF or maximal functional independence.    OBJECTIVE IMPAIRMENTS: Abnormal gait, decreased activity tolerance, decreased endurance, difficulty walking,  decreased ROM, decreased strength, hypomobility, impaired perceived functional ability, impaired flexibility, improper body mechanics, postural dysfunction, obesity, and pain.   ACTIVITY LIMITATIONS: sitting, standing, squatting, and stairs  PARTICIPATION LIMITATIONS: shopping and community activity  PERSONAL FACTORS: Age and 3+ comorbidities: OA, Hypertension, obesity, psoriasis  are also affecting patient's functional outcome.   REHAB POTENTIAL: Good  CLINICAL DECISION MAKING: Evolving/moderate complexity  EVALUATION COMPLEXITY: Moderate   GOALS: Goals reviewed with patient? No  SHORT TERM GOALS: Target date: 09/17/2023  Patient will be independent with initial home exercise program for self-management of symptoms. Baseline: Initial HEP to be provided at visit 2 as appropriate (09/03/23); Goal status: In-progress   LONG TERM GOALS: Target date: 11/26/2023  Patient will be independent with a long-term home exercise program for self-management of symptoms.  Baseline: Initial HEP to be provided at visit 2 as appropriate (09/03/23); Goal status: In-progress  2.  Patient will demonstrate improved FOTO by equal or greater than 10 points by visit #10 to demonstrate improvement in overall condition and self-reported functional ability.  Baseline: Will assess at future visit (09/03/23); 52 at visit # 2 (09/10/2023);  Goal status: In-progress  3.  Patient will exhibit equal LE strength bilaterally to improve gait mechanics and increase activity tolerance.  Baseline: Strength greater on LLE (09/03/23); Goal status: In-progress  4.  Patient will demonstrate improvement in Patient Specific Functional Scale (PSFS) of equal or greater than 3 points to reflect clinically significant improvement in patient's most valued functional activities. Baseline: Will assess at visit 2 (09/03/23); 3.33 (09/10/2023);  Goal status: In-progress  PLAN:  PT FREQUENCY: 1-2x/week  PT DURATION: 12  weeks  PLANNED INTERVENTIONS: 97164- PT Re-evaluation, 97110-Therapeutic exercises, 97530- Therapeutic activity, [id number]- Neuromuscular re-education, 97140- Manual therapy, [phone]- Gait training, Patient/Family education, Balance training, Stair training, Dry Needling, Joint mobilization, Joint manipulation, Cryotherapy, and Moist heat.  PLAN FOR NEXT SESSION: Update HEP as appropriate, progressive LE/functional  strengthening and balance exercises as tolerated. Consider joint mobilizations and self-mobilization/ROM exercises for hips and knee next session.   Camie SAUNDERS. Juli, PT, DPT 10/28/23, 9:24 AM  [facility] [address] [place], [place] [address] P: [phone] I F: [phone]

## 2023-10-28 ENCOUNTER — Encounter: Payer: Self-pay | Admitting: Physical Therapy

## 2023-10-29 ENCOUNTER — Ambulatory Visit: Payer: PPO | Admitting: Physical Therapy

## 2023-11-03 ENCOUNTER — Ambulatory Visit: Payer: PPO | Admitting: Physical Therapy

## 2023-11-03 ENCOUNTER — Encounter: Payer: Self-pay | Admitting: Physical Therapy

## 2023-11-03 DIAGNOSIS — G8929 Other chronic pain: Secondary | ICD-10-CM

## 2023-11-03 DIAGNOSIS — M170 Bilateral primary osteoarthritis of knee: Secondary | ICD-10-CM

## 2023-11-03 DIAGNOSIS — M25561 Pain in right knee: Secondary | ICD-10-CM | POA: Diagnosis not present

## 2023-11-03 DIAGNOSIS — R262 Difficulty in walking, not elsewhere classified: Secondary | ICD-10-CM

## 2023-11-03 NOTE — Therapy (Signed)
OUTPATIENT PHYSICAL THERAPY TREATMENT    Patient Name: Glenda Burke MRN: [id number] DOB:[birth year], 73 y.o., female Today's Date: 11/03/2023  END OF SESSION:  PT End of Session - 11/03/23 1415     Visit Number 7    Number of Visits 17    Date for PT Re-Evaluation 11/26/23    Authorization Type [employer] reporting period from 09/03/2023    Progress Note Due on Visit 10    PT Start Time 1350    PT Stop Time 1428    PT Time Calculation (min) 38 min    Activity Tolerance Patient tolerated treatment well    Behavior During Therapy WFL for tasks assessed/performed                Past Medical History:  Diagnosis Date   Allergy    Anemia    Arthritis    Cataract    Hyperlipidemia    Hypertension    Menopausal state    Past Surgical History:  Procedure Laterality Date   arm surgery Left    plate and screw   COLONOSCOPY  ?   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   Primary osteoarthritis of both first carpometacarpal joints 08/22/2020   Normocytic anemia 03/31/2019   Polyp of colon 09/22/2018   Psoriasis 04/24/2018   Tubular adenoma of colon 01/16/2018   Diabetes mellitus type 2, diet-controlled (HCC) 12/11/2017   Primary osteoarthritis of both knees 12/11/2017   Trigger thumb of left hand 11/12/2016   Primary osteoarthritis of right knee 06/02/2015   Bilateral shoulder pain 12/07/2014   Axial Low back pain 04/23/2013   Hyperlipidemia 02/24/2013   Anemia 02/24/2013   Essential hypertension 02/18/2013   Obesity 02/18/2013   Environmental allergies 02/18/2013   Annual physical exam 02/18/2013   Chronic constipation 02/18/2013    PCP: Monica Becton, MD  REFERRING PROVIDER: Monica Becton, MD  REFERRING DIAG:  Primary osteoarthritis of both knees, Chronic bilateral low back pain without sciatica  Rationale for Evaluation and Treatment: Rehabilitation  THERAPY DIAG:  Chronic pain of right knee  Difficulty in walking, not elsewhere classified  Primary osteoarthritis of both knees  ONSET DATE: Knee: March 2023; hip: A few months prior to PT Evaluation  SUBJECTIVE:     PERTINENT HISTORY:  Patient denies hx of cancer, stroke, seizures, lung problems, heart problems, diabetes, unexplained weight loss, unexplained changes in bowel or bladder problems, unexplained stumbling or dropping things, osteoporosis, and spinal surgery  SUBJECTIVE STATEMENT: Patient states she had no soreness after last PT session and her HEP is going well. She states she feels like she is walking better and she is walking a lot more than she used to. She is not as limited in how much she can walk.   PAIN:  NPRS: 0/10  PRECAUTIONS: Other: Limit high impact activity  PATIENT GOALS: Certain types of exercises she can do to relieve pain, walk better  OBJECTIVE:   1RM (calculated):  Double leg knee extension on Omega machine: 46.7# (last tested 10/01/2023);  Double leg hamstring curl: 54# (last estimated 10/01/2023);   Single leg knee extension at omega machine, seat position 1 (last measured 11/03/2023): R: 24.7#  L: 10.7#   TODAY'S TREATMENT:  Therapeutic exercise: to centralize symptoms and improve ROM, strength, muscular endurance, and activity tolerance required for successful completion of functional activities.  NuStep level 6 using bilateral upper and lower extremities. Seat/handle setting 7/10. For improved extremity mobility, muscular endurance, and activity tolerance; and to induce the analgesic effect of aerobic exercise, stimulate improved joint nutrition, and prepare body structures and systems for following interventions. x 5  minutes. Average SPM = 103.  Standing hip hikes off of 2x4 board (U UE  support): 2x20 each side   Double leg knee extension on OMEGA, seat position 1:  2x20 at 30# (~65% 1RM) Double leg hamstring curl on OMEGA, seat position 1:  2x20 at 35# (~ 65% 1RM) Single leg knee extension at omega machine, seat position 1:  R: 1x7 at 20# (max) L: 1x2 at 10# (max) R: 1x20 at 10# (~40% 1 RM) L: 1x17 at 5# (~47% 1RM)  Hooklying bridge Double leg: 1x3 (good form) Single leg with opposite foot crossed in figure 4 position: 1x3 each side (good form).   Sidelying knees to elbow bridge with clamshell:  2x10 each side (Needs reinforcement for cuing)   Manual therapy: to reduce pain and tissue tension, improve range of motion, neuromodulation, in order to promote improved ability to complete functional activities. HOOKLYING R hip LAD 3x30 seconds, grade III-IV with mob belt  Pt required multimodal cuing for proper technique and to facilitate improved neuromuscular control, strength, range of motion, and functional ability resulting in improved performance and form.  PATIENT EDUCATION:  Education provided: Exercise purpose/form. Self management techniques.  Person educated: Patient Education method: Explanation, Demonstration, and Verbal cues Education comprehension: verbalized understanding, returned demonstration, and needs further education  HOME EXERCISE PROGRAM: Access Code: [email or web address] URL: [email or web address] Date: 11/03/2023 Prepared by: Norton Blizzard  Exercises - Pigeon Pose  - 2 x daily - 7 x weekly - 1 sets - 10 reps - 5 hold - Side Stepping with Resistance at Feet  - 3-4 x weekly - 2 sets - 1:30 hold - Goblet Squat  - 3-4 x weekly - 3 sets - 10 reps - Isometric Squat with Resistance Loop  - 2-3 sets - 45 hold - Hip Hikes off step  - 1 x daily - 2 sets - 20 reps  ASSESSMENT:  CLINICAL IMPRESSION: Patient arrives with good tolerance to last PT session and HEP. Today's session continued with interventions to address hip and knee pain and  strength to improve walking pattern and functional activity tolerance. She demonstrated significant weakness on the left quads compared to the right. She struggled with side plank clams, but would benefit from continued supervision and cuing to improve her independence with it so it can be added to HEP. Patient was able to complete intervention with no lasting increase in pain, but continues to demonstrate compensated trendelenburg gait. Patient would benefit from continued management of limiting condition by skilled physical therapist to address remaining impairments and functional limitations to work towards stated goals and return to PLOF or maximal functional independence.   From Initial Evaluation 09/03/2023: Patient is a 73 y.o. female who was seen today for physical therapy evaluation and treatment for bilateral posterior hip pain and right knee pain.  Her B (R > L) hip extension and rotation ROM is limited, especially IR.  Her lack of ROM and weakness in her LE's is a likely contributing factor to her pain and abnormal walking pattern.  This walking pattern could be placing abnormal forces on her joints, further increasing  dysfunction.  She is active most days with walking and exercise classes, which is a positive prognostic factor. Patient will benefit from ROM, strengthening, and motor control exercises to address those deficits and improve her gait mechanics. Patient would benefit from continued management of limiting condition by skilled physical therapist to address remaining impairments and functional limitations to work towards stated goals and return to PLOF or maximal functional independence.    OBJECTIVE IMPAIRMENTS: Abnormal gait, decreased activity tolerance, decreased endurance, difficulty walking, decreased ROM, decreased strength, hypomobility, impaired perceived functional ability, impaired flexibility, improper body mechanics, postural dysfunction, obesity, and pain.   ACTIVITY  LIMITATIONS: sitting, standing, squatting, and stairs  PARTICIPATION LIMITATIONS: shopping and community activity  PERSONAL FACTORS: Age and 3+ comorbidities: OA, Hypertension, obesity, psoriasis  are also affecting patient's functional outcome.   REHAB POTENTIAL: Good  CLINICAL DECISION MAKING: Evolving/moderate complexity  EVALUATION COMPLEXITY: Moderate   GOALS: Goals reviewed with patient? No  SHORT TERM GOALS: Target date: 09/17/2023  Patient will be independent with initial home exercise program for self-management of symptoms. Baseline: Initial HEP to be provided at visit 2 as appropriate (09/03/23); Goal status: In-progress   LONG TERM GOALS: Target date: 11/26/2023  Patient will be independent with a long-term home exercise program for self-management of symptoms.  Baseline: Initial HEP to be provided at visit 2 as appropriate (09/03/23); Goal status: In-progress  2.  Patient will demonstrate improved FOTO by equal or greater than 10 points by visit #10 to demonstrate improvement in overall condition and self-reported functional ability.  Baseline: Will assess at future visit (09/03/23); 52 at visit # 2 (09/10/2023);  Goal status: In-progress  3.  Patient will exhibit equal LE strength bilaterally to improve gait mechanics and increase activity tolerance.  Baseline: Strength greater on LLE (09/03/23); Goal status: In-progress  4.  Patient will demonstrate improvement in Patient Specific Functional Scale (PSFS) of equal or greater than 3 points to reflect clinically significant improvement in patient's most valued functional activities. Baseline: Will assess at visit 2 (09/03/23); 3.33 (09/10/2023);  Goal status: In-progress  PLAN:  PT FREQUENCY: 1-2x/week  PT DURATION: 12 weeks  PLANNED INTERVENTIONS: 97164- PT Re-evaluation, 97110-Therapeutic exercises, 97530- Therapeutic activity, [id number]- Neuromuscular re-education, 97140- Manual therapy, [phone]- Gait training,  Patient/Family education, Balance training, Stair training, Dry Needling, Joint mobilization, Joint manipulation, Cryotherapy, and Moist heat.  PLAN FOR NEXT SESSION: Update HEP as appropriate, progressive LE/functional  strengthening and balance exercises as tolerated. Consider joint mobilizations and self-mobilization/ROM exercises for hips and knee next session.   Luretha Murphy. Ilsa Iha, PT, DPT 11/03/23, 2:31 PM  [facility] [address] [place], [place] [address] P: [phone] I F: [phone]

## 2023-11-05 ENCOUNTER — Encounter: Payer: PPO | Admitting: Physical Therapy

## 2023-11-10 ENCOUNTER — Encounter: Payer: Self-pay | Admitting: Physical Therapy

## 2023-11-10 ENCOUNTER — Ambulatory Visit: Payer: PPO | Admitting: Physical Therapy

## 2023-11-10 DIAGNOSIS — R262 Difficulty in walking, not elsewhere classified: Secondary | ICD-10-CM

## 2023-11-10 DIAGNOSIS — M25561 Pain in right knee: Secondary | ICD-10-CM | POA: Diagnosis not present

## 2023-11-10 DIAGNOSIS — M170 Bilateral primary osteoarthritis of knee: Secondary | ICD-10-CM

## 2023-11-10 DIAGNOSIS — G8929 Other chronic pain: Secondary | ICD-10-CM

## 2023-11-10 NOTE — Therapy (Signed)
OUTPATIENT PHYSICAL THERAPY TREATMENT / DISCHARGE SUMMARY Reporting from 09/03/2023 to 11/10/2023   Patient Name: Glenda Burke MRN: [id number] DOB:1951-02-27, 73 y.o., female Today's Date: 11/10/2023  END OF SESSION:  PT End of Session - 11/10/23 1459     Visit Number 8    Number of Visits 17    Date for PT Re-Evaluation 11/26/23    Authorization Type [employer] reporting period from 09/03/2023    Progress Note Due on Visit 10    PT Start Time 1350    PT Stop Time 1435    PT Time Calculation (min) 45 min    Activity Tolerance Patient tolerated treatment well    Behavior During Therapy [facility] for tasks assessed/performed                 Past Medical History:  Diagnosis Date   Allergy    Anemia    Arthritis    Cataract    Hyperlipidemia    Hypertension    Menopausal state    Past Surgical History:  Procedure Laterality Date   arm surgery Left    plate and screw   COLONOSCOPY  ?   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   Primary osteoarthritis of both first carpometacarpal joints 08/22/2020   Normocytic anemia 03/31/2019   Polyp of colon 09/22/2018   Psoriasis 04/24/2018   Tubular adenoma of colon 01/16/2018   Diabetes mellitus type 2, diet-controlled (HCC) 12/11/2017   Primary osteoarthritis of both knees 12/11/2017   Trigger thumb of left hand 11/12/2016   Primary osteoarthritis of right knee 06/02/2015   Bilateral shoulder pain 12/07/2014   Axial Low back pain 04/23/2013   Hyperlipidemia 02/24/2013   Anemia 02/24/2013   Essential hypertension 02/18/2013   Obesity 02/18/2013   Environmental allergies 02/18/2013   Annual physical exam 02/18/2013   Chronic constipation 02/18/2013    PCP: Monica Becton, MD  REFERRING  PROVIDER: Monica Becton, MD  REFERRING DIAG: Primary osteoarthritis of both knees, Chronic bilateral low back pain without sciatica  Rationale for Evaluation and Treatment: Rehabilitation  THERAPY DIAG:  Chronic pain of right knee  Difficulty in walking, not elsewhere classified  Primary osteoarthritis of both knees  ONSET DATE: Knee: March 2023; hip: A few months prior to PT Evaluation  SUBJECTIVE:     PERTINENT HISTORY:  Patient denies hx of cancer, stroke, seizures, lung problems, heart problems, diabetes, unexplained weight loss, unexplained changes in bowel or bladder problems, unexplained stumbling or dropping things, osteoporosis, and spinal surgery  SUBJECTIVE STATEMENT: Patient arrives to session reporting feeling good since last PT session. Patient reports completing her HEP regularly and exercises are going well except for experiencing an increase in right shoulder pain during side plank exercise. She states she felt some intermittent right knee pain and hip pain since last session, but pain resolved on its own the following day. Patient is going on a trip for several weeks and is taking a break from PT. Patient reports she is planning to discuss returning to PT with her doctor after her trip and including a referral for her shoulder.     PAIN:  NPRS: 0/10  PRECAUTIONS: Other: Limit high impact activity  PATIENT GOALS: Certain types of exercises she  can do to relieve pain, walk better   OBJECTIVE:   SELF-REPORTED FUNCTION FOTO score: 73/100 (Hip questionnaire)   SELF-REPORTED FUNCTION Patient Specific Functional Scale (PSFS)  Walking: 10 Climbing stairs: 10 Standing long periods: 10 Average: 10  MUSCLE PERFORMANCE (MMT):  *Indicates pain 09/03/23 11/10/23 Date  Joint/Motion R/L R/L R/L  Hip     Flexion (L1, L2) 3/3 4/4 /  Extension (knee ext) 3+/3+ 4/4 /  Extension (knee flex) / / /  Abduction 4/4 4/4 /  Adduction / / /  External rotation / / /   Internal rotation  / / /  Knee     Extension (L3) 5/5 5/5 /  Flexion (S2) 3+/3+ 4/4 /  Comments:  11/10/2023: Hip extension and knee flexion assessed in prone.    TODAY'S TREATMENT:     Therapeutic exercise: to centralize symptoms and improve ROM, strength, muscular endurance, and activity tolerance required for successful completion of functional activities.  NuStep level 6 using bilateral upper and lower extremities. Seat/handle setting 7/10. For improved extremity mobility, muscular endurance, and activity tolerance; and to induce the analgesic effect of aerobic exercise, stimulate improved joint nutrition, and prepare body structures and systems for following interventions. x 5  minutes. Average SPM = 85.   Lower Extremity MMT (see above)  Seated Windshield Wipers (Hip IR/ER)    1x10 each way  Hooklying bridge Double leg: 1x5 (good form) Single leg with opposite foot crossed in figure 4 position: 1x5 each side (good form).  Patient reports feeling deeper stretch in hip joint when pressing hip into ER.   Staggered Stance Hip Airplanes - 2x10   Squats - 1x5 (good form)   Seated Knee Extension with Overpressure   5 second hold x 5 (right leg)     Patient education on HEP including handout   Pt required multimodal cuing for proper technique and to facilitate improved neuromuscular control, strength, range of motion, and functional ability resulting in improved performance and form.  PATIENT EDUCATION:  Education provided: Exercise purpose/form. Self management techniques.  Person educated: Patient Education method: Explanation, Demonstration, and Verbal cues Education comprehension: verbalized understanding, returned demonstration, and needs further education  HOME EXERCISE PROGRAM: Access Code: [email or web address] URL: [email or web address] Date: 11/03/2023 Prepared by: Norton Blizzard  Exercises - Pigeon Pose  - 2 x daily - 7 x weekly - 1 sets - 10 reps - 5 hold -  Side Stepping with Resistance at Feet  - 3-4 x weekly - 2 sets - 1:30 hold - Goblet Squat  - 3-4 x weekly - 3 sets - 10 reps - Isometric Squat with Resistance Loop  - 2-3 sets - 45 hold - Hip Hikes off step  - 1 x daily - 2 sets - 20 reps  HOME EXERCISE PROGRAM [[email or web address]]   Hip IR/ER Stretch -  Repeat 10 Repetitions, Hold 2 Seconds, Complete 2 Sets, Perform 1 Times a Day Staggered Stance   Hip Airplanes -  Repeat 15 Repetitions, Complete 2 Sets, Perform 3 Times a Week   SEATED KNEE EXTENSION WITH OVERPRESSURE -  Repeat 10 Repetitions, Hold 5 Seconds, Perform 1 Times a Day    ASSESSMENT:  CLINICAL IMPRESSION: Patient has completed 8 skilled physical therapy treatment sessions this episode of care and overall has made progress and met all short and long term goals. Patient is a 73 y.o. female referred to outpatient physical therapy with a medical diagnosis of bilateral posterior hip pain and right knee pain. Patient demonstrates improvement in pain,  activity tolerance with ambulation, lower extremity strength, hip range of motion, neuromuscular control, and understanding of self-management by regularly participating in her HEP. Since initial evaluation, patient has shown increased bilateral lower extremity strength to 4/5 in hip flexion, hip extension, and knee flexion. Self-reported functional ability has significantly improved in FOTO score from 52/100 to 73/100 and Patient Specific Functional Scale from 3.3 to 10. Patient tolerated interventions well and was provided with updated HEP to continue with self-management of strength and range of motion. Patient is being discharged due to meeting the stated rehabilitation goals and going on a prolonged trip resulting in not being able to come to PT for several weeks.    From Initial Evaluation 09/03/2023: Patient is a 73 y.o. female who was seen today for physical therapy evaluation and treatment for bilateral posterior hip pain and right knee pain.   Her B (R > L) hip extension and rotation ROM is limited, especially IR.  Her lack of ROM and weakness in her LE's is a likely contributing factor to her pain and abnormal walking pattern.  This walking pattern could be placing abnormal forces on her joints, further increasing dysfunction.  She is active most days with walking and exercise classes, which is a positive prognostic factor. Patient will benefit from ROM, strengthening, and motor control exercises to address those deficits and improve her gait mechanics. Patient would benefit from continued management of limiting condition by skilled physical therapist to address remaining impairments and functional limitations to work towards stated goals and return to PLOF or maximal functional independence.    OBJECTIVE IMPAIRMENTS: Abnormal gait, decreased activity tolerance, decreased endurance, difficulty walking, decreased ROM, decreased strength, hypomobility, impaired perceived functional ability, impaired flexibility, improper body mechanics, postural dysfunction, obesity, and pain.   ACTIVITY LIMITATIONS: sitting, standing, squatting, and stairs  PARTICIPATION LIMITATIONS: shopping and community activity  PERSONAL FACTORS: Age and 3+ comorbidities: OA, Hypertension, obesity, psoriasis  are also affecting patient's functional outcome.   REHAB POTENTIAL: Good  CLINICAL DECISION MAKING: Evolving/moderate complexity  EVALUATION COMPLEXITY: Moderate   GOALS: Goals reviewed with patient? No  SHORT TERM GOALS: Target date: 09/17/2023  Patient will be independent with initial home exercise program for self-management of symptoms. Baseline: Initial HEP to be provided at visit 2 as appropriate (09/03/23); Good participation in HEP (11/10/23) Goal status: Met   LONG TERM GOALS: Target date: 11/26/2023  Patient will be independent with a long-term home exercise program for self-management of symptoms.  Baseline: Initial HEP to be provided at  visit 2 as appropriate (09/03/23); Continued good participation in HEP (11/10/23) Goal status: Met  2.  Patient will demonstrate improved FOTO by equal or greater than 10 points by visit #10 to demonstrate improvement in overall condition and self-reported functional ability.  Baseline: Will assess at future visit (09/03/23); 52 at visit # 2 (09/10/2023); 73 at visit #8 (11/10/23) Goal status: Met  3.  Patient will exhibit equal LE strength bilaterally to improve gait mechanics and increase activity tolerance.  Baseline: Strength greater on LLE (09/03/23); Improvements in Right LE strength, equal bilaterally (11/10/23); Goal status: Met  4.  Patient will demonstrate improvement in Patient Specific Functional Scale (PSFS) of equal or greater than 3 points to reflect clinically significant improvement in patient's most valued functional activities. Baseline: Will assess at visit 2 (09/03/23); 3.33 (09/10/2023); 10 (11/10/23) Goal status: Met  PLAN:  PT FREQUENCY: 1-2x/week  PT DURATION: 12 weeks  PLANNED INTERVENTIONS: 97164- PT Re-evaluation, 97110-Therapeutic exercises, 97530- Therapeutic activity, 97112-  Neuromuscular re-education, 97140- Manual therapy, [phone]- Gait training, Patient/Family education, Balance training, Stair training, Dry Needling, Joint mobilization, Joint manipulation, Cryotherapy, and Moist heat.  PLAN FOR NEXT SESSION: PT is now discharged from PT   Gavriela Cashin [place], SPT [facility] DPTE    Huntley Dec R. Ilsa Iha, PT, DPT 11/10/23, 4:33 PM  [facility] [address] [place], [place] [address] P: [phone] I F: [phone]

## 2023-11-12 ENCOUNTER — Encounter: Payer: PPO | Admitting: Physical Therapy

## 2023-11-12 DIAGNOSIS — L409 Psoriasis, unspecified: Secondary | ICD-10-CM | POA: Diagnosis not present

## 2023-11-12 DIAGNOSIS — L309 Dermatitis, unspecified: Secondary | ICD-10-CM | POA: Diagnosis not present

## 2023-11-17 ENCOUNTER — Encounter: Payer: PPO | Admitting: Physical Therapy

## 2023-11-19 ENCOUNTER — Encounter: Payer: PPO | Admitting: Physical Therapy

## 2023-11-26 ENCOUNTER — Encounter: Payer: PPO | Admitting: Physical Therapy

## 2023-12-01 ENCOUNTER — Encounter: Payer: PPO | Admitting: Physical Therapy

## 2023-12-10 DIAGNOSIS — L400 Psoriasis vulgaris: Secondary | ICD-10-CM | POA: Diagnosis not present

## 2023-12-31 ENCOUNTER — Telehealth: Payer: Self-pay | Admitting: Sports Medicine

## 2023-12-31 ENCOUNTER — Ambulatory Visit: Payer: Self-pay

## 2023-12-31 NOTE — Telephone Encounter (Signed)
 Copied From CRM [id number]. Reason for Triage: Patient would like to have a nurse call her to advise her. She states she needs guidance on what to do because she wants to be seen by a provider in the clinic today but there is currently no appointment available. Patient insisted agent advise. Agent denied to advise what she should do, upsetting patient. Patient agreeable to speaking to nurse for advisement.    Reason for Disposition  Health Information question, no triage required and triager able to answer question  Answer Assessment - Initial Assessment Questions 1. REASON FOR CALL or QUESTION: "What is your reason for calling today?" or "How can I best help you?" or "What question do you have that I can help answer?"     Pt called stated she has an appt on Monday with her PCP - however wants to see if she can be prescribed medication or giving suggestions of OTC medication to take to help with s/s until appt.  Pt states every time she swallow throat hurts 5/10 pain, states swallowing increases pain in left ear severe.   Pt would like a call back with any suggestions - would prefer a prescription.  Protocols used: Information Only Call - No Triage-A-AH

## 2023-12-31 NOTE — Telephone Encounter (Signed)
 Copied from CRM [id number]. Topic: Appointments - Appointment Scheduling >> Dec 31, 2023  9:42 AM Emylou G wrote: Ear/throat infection.. worsening.please call patient for appt [phone]  Patient called for apt with Dr. Karie Schwalbe.  Patient was warm transferred to office to schedule.

## 2023-12-31 NOTE — Telephone Encounter (Signed)
 Pt states every time she swallow throat hurts 5/10 pain, states swallowing increases pain in left ear severe. - would prefer a prescription.

## 2023-12-31 NOTE — Telephone Encounter (Deleted)
 Pt called back in to speak with practice about her ear throat pain, I connected her to the office

## 2024-01-01 NOTE — Telephone Encounter (Signed)
 Please let her know that I am not sure what to treat her with because I do not know what is causing the throat pain.  She probably needs an appointment to at least look for strep throat.  My partners have several openings tomorrow.

## 2024-01-02 ENCOUNTER — Ambulatory Visit: Admitting: Family Medicine

## 2024-01-02 ENCOUNTER — Encounter: Payer: Self-pay | Admitting: Sports Medicine

## 2024-01-02 ENCOUNTER — Ambulatory Visit (INDEPENDENT_AMBULATORY_CARE_PROVIDER_SITE_OTHER): Admitting: Sports Medicine

## 2024-01-02 VITALS — BP 137/84 | HR 74 | Temp 97.9°F | Resp 20 | Ht 61.0 in

## 2024-01-02 DIAGNOSIS — R6889 Other general symptoms and signs: Secondary | ICD-10-CM | POA: Diagnosis not present

## 2024-01-02 DIAGNOSIS — J029 Acute pharyngitis, unspecified: Secondary | ICD-10-CM | POA: Insufficient documentation

## 2024-01-02 LAB — POCT INFLUENZA A/B
Influenza A, POC: NEGATIVE
Influenza B, POC: NEGATIVE

## 2024-01-02 LAB — POCT RAPID STREP A (OFFICE): Rapid Strep A Screen: NEGATIVE

## 2024-01-02 LAB — POC COVID19 BINAXNOW: SARS Coronavirus 2 Ag: NEGATIVE

## 2024-01-02 NOTE — Progress Notes (Signed)
    Procedures performed today:    None.  Independent interpretation of notes and tests performed by another provider:   None.  Brief History, Exam, Impression, and Recommendations:    Pharyngitis with eustachian tube dysfunction Pleasant 73 year old female, few weeks of sore throat, occasional fullness with difficulty equalizing pressure left ear. COVID, strep, flu test negative today. No cough. Exam is completely normal. Gave information on eustachian tube dysfunction, pharyngitis, likely viral, she can do home remedies and return as needed.  I spent 30 minutes of total time managing this patient today, this includes chart review, face to face, and non-face to face time.  ____________________________________________ Ihor Austin. Benjamin Stain, M.D., ABFM., CAQSM., AME. Primary Care and Sports Medicine [facility]  Adjunct Professor of Family Medicine  [place] [employer] [facility] [employer]  Restaurant manager, fast food

## 2024-01-02 NOTE — Assessment & Plan Note (Signed)
 Pleasant 73 year old female, few weeks of sore throat, occasional fullness with difficulty equalizing pressure left ear. COVID, strep, flu test negative today. No cough. Exam is completely normal. Gave information on eustachian tube dysfunction, pharyngitis, likely viral, she can do home remedies and return as needed.

## 2024-01-02 NOTE — Telephone Encounter (Signed)
 Left message for a return call

## 2024-01-05 ENCOUNTER — Ambulatory Visit: Admitting: Sports Medicine

## 2024-02-12 ENCOUNTER — Encounter: Payer: PPO | Admitting: Sports Medicine

## 2024-03-12 ENCOUNTER — Encounter: Admitting: Sports Medicine

## 2024-04-02 ENCOUNTER — Other Ambulatory Visit: Payer: Self-pay | Admitting: Sports Medicine

## 2024-04-02 DIAGNOSIS — E785 Hyperlipidemia, unspecified: Secondary | ICD-10-CM

## 2024-04-02 DIAGNOSIS — I10 Essential (primary) hypertension: Secondary | ICD-10-CM

## 2024-04-05 DIAGNOSIS — H2513 Age-related nuclear cataract, bilateral: Secondary | ICD-10-CM | POA: Diagnosis not present

## 2024-04-05 LAB — OPHTHALMOLOGY REPORT-SCANNED

## 2024-04-12 ENCOUNTER — Ambulatory Visit (INDEPENDENT_AMBULATORY_CARE_PROVIDER_SITE_OTHER): Admitting: Sports Medicine

## 2024-04-12 ENCOUNTER — Telehealth: Payer: Self-pay | Admitting: Sports Medicine

## 2024-04-12 VITALS — BP 114/74 | HR 74 | Ht 61.0 in | Wt 183.0 lb

## 2024-04-12 DIAGNOSIS — E119 Type 2 diabetes mellitus without complications: Secondary | ICD-10-CM | POA: Diagnosis not present

## 2024-04-12 DIAGNOSIS — R7612 Nonspecific reaction to cell mediated immunity measurement of gamma interferon antigen response without active tuberculosis: Secondary | ICD-10-CM | POA: Diagnosis not present

## 2024-04-12 DIAGNOSIS — Z111 Encounter for screening for respiratory tuberculosis: Secondary | ICD-10-CM

## 2024-04-12 DIAGNOSIS — Z0000 Encounter for general adult medical examination without abnormal findings: Secondary | ICD-10-CM

## 2024-04-12 DIAGNOSIS — L409 Psoriasis, unspecified: Secondary | ICD-10-CM | POA: Diagnosis not present

## 2024-04-12 DIAGNOSIS — M170 Bilateral primary osteoarthritis of knee: Secondary | ICD-10-CM | POA: Diagnosis not present

## 2024-04-12 NOTE — Progress Notes (Addendum)
 Subjective:    CC: Annual Physical Exam  HPI:  This patient is here for their annual physical  I reviewed the past medical history, family history, social history, surgical history, and allergies today and no changes were needed.  Please see the problem list section below in epic for further details.  Past Medical History: Past Medical History:  Diagnosis Date   Allergy    Anemia    Arthritis    Cataract    Hyperlipidemia    Hypertension    Menopausal state    Past Surgical History: Past Surgical History:  Procedure Laterality Date   arm surgery Left    plate and screw   COLONOSCOPY  ?   out of [place]   DILATION AND CURETTAGE OF UTERUS     polypectomy     Social History: Social History   Socioeconomic History   Marital status: Married    Spouse name: Not on file   Number of children: Not on file   Years of education: Not on file   Highest education level: Not on file  Occupational History   Occupation: retired  Tobacco Use   Smoking status: Never   Smokeless tobacco: Never  Vaping Use   Vaping status: Never Used  Substance and Sexual Activity   Alcohol use: Yes    Comment: wine occasional   Drug use: No   Sexual activity: Yes    Partners: Male  Other Topics Concern   Not on file  Social History Narrative   Retired [employer]    Married    From [place]    Likes to travel    Social Drivers of Corporate investment banker Strain: Not on Ship broker Insecurity: Not on file  Transportation Needs: Not on file  Physical Activity: Not on file  Stress: Not on file  Social Connections: Not on file   Family History: Family History  Problem Relation Age of Onset   Hypertension Mother    Hyperlipidemia Mother    Congestive Heart Failure Mother    Stroke Father    Diabetes Father    Hyperlipidemia Father    Hypertension Father    Hypertension Sister    Hyperlipidemia Sister    Diabetes Sister    Kidney disease Sister        on HD   Kidney  disease Brother        kidney transplant    Hypertension Brother    Hyperlipidemia Brother    Diabetes Brother    Colon cancer Neg Hx    Stomach cancer Neg Hx    Breast cancer Neg Hx    Esophageal cancer Neg Hx    Rectal cancer Neg Hx    Allergies: Allergies  Allergen Reactions   Hydrocodone  Swelling   Medications: See med rec.  Review of Systems: No headache, visual changes, nausea, vomiting, diarrhea, constipation, dizziness, abdominal pain, skin rash, fevers, chills, night sweats, swollen lymph nodes, weight loss, chest pain, body aches, joint swelling, muscle aches, shortness of breath, mood changes, visual or auditory hallucinations.  Objective:    General: Well Developed, well nourished, and in no acute distress.  Neuro: Alert and oriented x3, extra-ocular muscles intact, sensation grossly intact. Cranial nerves II through XII are intact, motor, sensory, and coordinative functions are all intact. HEENT: Normocephalic, atraumatic, pupils equal round reactive to light, neck supple, no masses, no lymphadenopathy, thyroid  nonpalpable. Oropharynx, nasopharynx, external ear canals are unremarkable. Skin: Warm and dry, psoriatic plaques arms,  elbows, lower legs, umbilicus. Cardiac: Regular rate and rhythm, no murmurs rubs or gallops.  Respiratory: Clear to auscultation bilaterally. Not using accessory muscles, speaking in full sentences.  Abdominal: Soft, nontender, nondistended, positive bowel sounds, no masses, no organomegaly.  Musculoskeletal: Shoulder, elbow, wrist, hip, knee, ankle stable, and with full range of motion.  Impression and Recommendations:    The patient was counselled, risk factors were discussed, anticipatory guidance given.  Annual physical exam Fasting annual physical as above, labs checked.   Psoriasis Widespread psoriasis, elbows, lower legs. We have tried calcipotriene , clobetasol . At this point I think she needs dermatology consultation for  discussion of biologic. We will do her Quant gold today.  Primary osteoarthritis of both knees Known bilateral knee osteoarthritis, no significant meniscal tearing on MRI from last year, she has had physical therapy for 6 weeks, steroid injections, viscosupplementation. Takes Tylenol  3 times daily, has done well with Orthovisc in the past. She is ready to try again, we will go and work on approval.  This was last done in 2023.  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 No cough or other TB symptoms    ____________________________________________ Debby PARAS. Curtis, M.D., ABFM., CAQSM., AME. Primary Care and Sports Medicine [facility]  Adjunct Professor of [facility] Medicine  [employer]  Restaurant manager, fast food

## 2024-04-12 NOTE — Telephone Encounter (Signed)
 Visco approval please, bilateral x-ray confirmed osteoarthritis, did well with Orthovisc in 2023, has failed steroid injections, analgesics.

## 2024-04-12 NOTE — Assessment & Plan Note (Signed)
 Known bilateral knee osteoarthritis, no significant meniscal tearing on MRI from last year, she has had physical therapy for 6 weeks, steroid injections, viscosupplementation. Takes Tylenol  3 times daily, has done well with Orthovisc in the past. She is ready to try again, we will go and work on approval.  This was last done in 2023.

## 2024-04-12 NOTE — Assessment & Plan Note (Signed)
 Widespread psoriasis, elbows, lower legs. We have tried calcipotriene , clobetasol . At this point I think she needs dermatology consultation for discussion of biologic. We will do her Quant gold today.

## 2024-04-12 NOTE — Assessment & Plan Note (Signed)
 Fasting annual physical as above, labs checked.

## 2024-04-13 ENCOUNTER — Ambulatory Visit: Payer: Self-pay | Admitting: Sports Medicine

## 2024-04-13 ENCOUNTER — Encounter: Admitting: Sports Medicine

## 2024-04-14 NOTE — Telephone Encounter (Signed)
[employer] paperwork faxed to [employer] at [phone] ?Request is for Orthovisc or Monovisc ?Fax confirmation receipt received  ?

## 2024-04-16 LAB — COMPREHENSIVE METABOLIC PANEL WITH GFR
ALT: 28 IU/L (ref 0–32)
AST: 25 IU/L (ref 0–40)
Albumin: 4.1 g/dL (ref 3.8–4.8)
Alkaline Phosphatase: 194 IU/L — ABNORMAL HIGH (ref 44–121)
BUN/Creatinine Ratio: 23 (ref 12–28)
BUN: 30 mg/dL — ABNORMAL HIGH (ref 8–27)
Bilirubin Total: 0.4 mg/dL (ref 0.0–1.2)
CO2: 21 mmol/L (ref 20–29)
Calcium: 9.2 mg/dL (ref 8.7–10.3)
Chloride: 103 mmol/L (ref 96–106)
Creatinine, Ser: 1.3 mg/dL — ABNORMAL HIGH (ref 0.57–1.00)
Globulin, Total: 2.9 g/dL (ref 1.5–4.5)
Glucose: 114 mg/dL — ABNORMAL HIGH (ref 70–99)
Potassium: 3.8 mmol/L (ref 3.5–5.2)
Sodium: 140 mmol/L (ref 134–144)
Total Protein: 7 g/dL (ref 6.0–8.5)
eGFR: 44 mL/min/1.73 — ABNORMAL LOW (ref >59)

## 2024-04-16 LAB — CBC
Hematocrit: 36.2 % (ref 34.0–46.6)
Hemoglobin: 11.5 g/dL (ref 11.1–15.9)
MCH: 29 pg (ref 26.6–33.0)
MCHC: 31.8 g/dL (ref 31.5–35.7)
MCV: 91 fL (ref 79–97)
Platelets: 200 x10E3/uL (ref 150–450)
RBC: 3.97 x10E6/uL (ref 3.77–5.28)
RDW: 13.9 % (ref 11.7–15.4)
WBC: 6.5 x10E3/uL (ref 3.4–10.8)

## 2024-04-16 LAB — QUANTIFERON-TB GOLD PLUS
QuantiFERON Mitogen Value: >10 [IU]/mL
QuantiFERON Nil Value: 0.05 [IU]/mL
QuantiFERON TB1 Ag Value: 0.57 [IU]/mL
QuantiFERON TB2 Ag Value: 0.68 [IU]/mL
QuantiFERON-TB Gold Plus: POSITIVE — AB

## 2024-04-16 LAB — HEMOGLOBIN A1C
Est. average glucose Bld gHb Est-mCnc: 140 mg/dL
Hgb A1c MFr Bld: 6.5 % — ABNORMAL HIGH (ref 4.8–5.6)

## 2024-04-16 LAB — MICROALBUMIN / CREATININE URINE RATIO
Creatinine, Urine: 126 mg/dL
Microalb/Creat Ratio: 3 mg/g{creat} (ref 0–29)
Microalbumin, Urine: 3.9 ug/mL

## 2024-04-16 LAB — LIPID PANEL
Chol/HDL Ratio: 2.3 ratio (ref 0.0–4.4)
Cholesterol, Total: 158 mg/dL (ref 100–199)
HDL: 69 mg/dL (ref >39)
LDL Chol Calc (NIH): 76 mg/dL (ref 0–99)
Triglycerides: 64 mg/dL (ref 0–149)
VLDL Cholesterol Cal: 13 mg/dL (ref 5–40)

## 2024-04-16 LAB — TSH: TSH: 1.47 u[IU]/mL (ref 0.450–4.500)

## 2024-04-18 DIAGNOSIS — R7612 Nonspecific reaction to cell mediated immunity measurement of gamma interferon antigen response without active tuberculosis: Secondary | ICD-10-CM | POA: Insufficient documentation

## 2024-04-18 NOTE — Addendum Note (Signed)
 Addended by: CURTIS DEBBY PARAS on: 04/18/2024 08:18 AM   Modules accepted: Orders

## 2024-04-18 NOTE — Assessment & Plan Note (Addendum)
 Hx BCG vaccine, so we did quantiferon instead of PPD in anticipation of biologic tx for psoriasis, quantiferon positive, adding CXR and repeat quantiferon test. No cough or other TB symptoms

## 2024-04-19 NOTE — Telephone Encounter (Signed)
 Benefits Investigation Details received from [employer] Injection: Orthovisc  Medical: Deductible does not apply. Once the OOP is met, pt is covered at 100%. Only one copay applies to date of service.  PA required: No  Pharmacy: Benefits currently not available  May fill through: Buy and Bill OR Specialty Pharmacy [facility] Copay/Coinsurance: $0 Product Copay: 20% ($140) Administration Coinsurance: 20% ($26) Administration Copay: $0 Deductible: does not apply Out of Pocket Max: $3400 (met: $80)    Left msg for a return call from patient regarding gel injection benefits.

## 2024-05-03 NOTE — Telephone Encounter (Signed)
 Left VM for a return call from patient regarding positive lab results and the need for follow up.

## 2024-06-03 ENCOUNTER — Other Ambulatory Visit (INDEPENDENT_AMBULATORY_CARE_PROVIDER_SITE_OTHER)

## 2024-06-03 ENCOUNTER — Ambulatory Visit: Admitting: Sports Medicine

## 2024-06-03 ENCOUNTER — Ambulatory Visit

## 2024-06-03 ENCOUNTER — Encounter: Payer: Self-pay | Admitting: Sports Medicine

## 2024-06-03 DIAGNOSIS — M170 Bilateral primary osteoarthritis of knee: Secondary | ICD-10-CM | POA: Diagnosis not present

## 2024-06-03 DIAGNOSIS — R7612 Nonspecific reaction to cell mediated immunity measurement of gamma interferon antigen response without active tuberculosis: Secondary | ICD-10-CM

## 2024-06-03 DIAGNOSIS — I700 Atherosclerosis of aorta: Secondary | ICD-10-CM | POA: Diagnosis not present

## 2024-06-03 MED ORDER — TRIAMCINOLONE ACETONIDE 40 MG/ML IJ SUSP
40.0000 mg | Freq: Once | INTRAMUSCULAR | Status: AC
  Administered 2024-06-03: 40 mg via INTRA_ARTICULAR

## 2024-06-03 MED ORDER — HYALURONAN 30 MG/2ML IX SOSY
30.0000 mg | PREFILLED_SYRINGE | Freq: Once | INTRA_ARTICULAR | Status: AC
  Administered 2024-06-03: 30 mg via INTRA_ARTICULAR

## 2024-06-03 NOTE — Progress Notes (Signed)
    Procedures performed today:    Procedure: Real-time Ultrasound Guided injection of the right knee Device: Samsung HS60  Verbal informed consent obtained.  Time-out conducted.  Noted no overlying erythema, induration, or other signs of local infection.  Skin prepped in a sterile fashion.  Local anesthesia: Topical Ethyl chloride.  With sterile technique and under real time ultrasound guidance: Moderate effusion noted, 1 cc Kenalog  40, 2 cc lidocaine, 2 cc bupivacaine injected into the suprapatellar recess, syringe switched and 1 syringe of 30 mg/2 mL of OrthoVisc (sodium hyaluronate) in a prefilled syringe was injected easily into the knee through a 22-gauge needle. Completed without difficulty  Advised to call if fevers/chills, erythema, induration, drainage, or persistent bleeding.  Images permanently stored and available for review in PACS.  Impression: Technically successful ultrasound guided injection.  Independent interpretation of notes and tests performed by another provider:   None.  Brief History, Exam, Impression, and Recommendations:    Primary osteoarthritis of both knees Very pleasant 73 year old female, bilateral knee osteoarthritis, she had no significant meniscal tearing on MRI from 2024, has had some physical therapy, her last injection was done in 2023, Orthovisc. Now having a recurrence of pain, we did recently get her approved for viscosupplementation. She is having increasing pain right knee, fairly severe, we did a right knee steroid injection chased with Orthovisc No. 1 of 4. Return to see me for Orthovisc No. 2 of 4 in a week.    ____________________________________________ Debby PARAS. Curtis, M.D., ABFM., CAQSM., AME. Primary Care and Sports Medicine [facility]  Adjunct Professor of [facility] Medicine  [employer]  Restaurant manager, fast food

## 2024-06-03 NOTE — Assessment & Plan Note (Signed)
 Very pleasant 73 year old female, bilateral knee osteoarthritis, she had no significant meniscal tearing on MRI from 2024, has had some physical therapy, her last injection was done in 2023, Orthovisc. Now having a recurrence of pain, we did recently get her approved for viscosupplementation. She is having increasing pain right knee, fairly severe, we did a right knee steroid injection chased with Orthovisc No. 1 of 4. Return to see me for Orthovisc No. 2 of 4 in a week.

## 2024-06-07 ENCOUNTER — Ambulatory Visit: Admitting: Sports Medicine

## 2024-06-09 ENCOUNTER — Telehealth: Payer: Self-pay

## 2024-06-09 NOTE — Telephone Encounter (Signed)
 Call to patient after re-verifying her insurance for Dr. Charles. Her copay went to $285 from $166 due to Dr. Charles being out of network for her. Patient asked if she could see Dr. Selinda Ku in [facility] which is much closer to her home to complete the injections. Advised patient we would be in contact with Dr. DOROTHA Ku office to inquire about this possibility and be back with her soon. Verified with patient that she is not to come to our office on 06/11/24 for an appt. Patient verbalized understanding and will await our call about being seen in [place].

## 2024-06-11 ENCOUNTER — Ambulatory Visit: Admitting: Sports Medicine

## 2024-06-11 ENCOUNTER — Encounter: Admitting: Sports Medicine

## 2024-06-15 ENCOUNTER — Ambulatory Visit: Admitting: Sports Medicine

## 2024-06-15 ENCOUNTER — Encounter: Payer: Self-pay | Admitting: Sports Medicine

## 2024-06-16 ENCOUNTER — Telehealth: Payer: Self-pay | Admitting: Family Medicine

## 2024-06-16 ENCOUNTER — Ambulatory Visit (INDEPENDENT_AMBULATORY_CARE_PROVIDER_SITE_OTHER): Admitting: Family Medicine

## 2024-06-16 ENCOUNTER — Encounter: Payer: Self-pay | Admitting: Family Medicine

## 2024-06-16 ENCOUNTER — Other Ambulatory Visit (INDEPENDENT_AMBULATORY_CARE_PROVIDER_SITE_OTHER): Payer: Self-pay | Admitting: Radiology

## 2024-06-16 VITALS — BP 126/80 | HR 80 | Ht 61.0 in | Wt 186.0 lb

## 2024-06-16 DIAGNOSIS — M1711 Unilateral primary osteoarthritis, right knee: Secondary | ICD-10-CM

## 2024-06-16 MED ORDER — HYALURONAN 30 MG/2ML IX SOSY
30.0000 mg | PREFILLED_SYRINGE | Freq: Once | INTRA_ARTICULAR | Status: AC
Start: 2024-06-16 — End: 2024-06-16
  Administered 2024-06-16: 30 mg via INTRA_ARTICULAR

## 2024-06-16 NOTE — Patient Instructions (Signed)
 Patient Plan for Post-Visit Guidance  Right Knee Osteoarthritis  - Receive the second gel injection as part of your current treatment series. - Schedule and complete two more gel injections as planned. - Apply ice to your knee for 20 minutes after activity. - Continue gym activities, focusing on upper body exercises for the next two days. - Do not use NSAIDs due to kidney concerns. - Consider using Voltaren  gel for knee pain, but use with caution due to psoriasis.  Psoriasis  - Test a small amount of Voltaren  gel on your skin to check for irritation. - Wipe off the Voltaren  gel after 15 minutes to reduce the risk of skin irritation.  Red Flags  - Seek medical attention if you experience severe knee swelling, redness, warmth, fever, severe skin irritation, or any new or worsening symptoms.

## 2024-06-16 NOTE — Progress Notes (Signed)
 Primary Care / Sports Medicine Office Visit  Patient Information:  Patient ID: Glenda Burke, female DOB: 1951-09-06 Age: 73 y.o. MRN: [id number]   Glenda Burke is a pleasant 73 y.o. female presenting with the following:  Chief Complaint  Patient presents with   Injections    Patient presents today for a Orthovisc injection for her right knee. She did not have any reaction to her last Orthovisc injection. She had improvement in her knee pain since her first Orthovisc injection.    Vitals:   06/16/24 0818  BP: 126/80  Pulse: 80  SpO2: 98%   Vitals:   06/16/24 0818  Weight: 186 lb (84.4 kg)  Height: 5' 1 (1.549 m)   Body mass index is 35.14 kg/m.    Independent interpretation of notes and tests performed by another provider:   None  Procedures performed:   Procedure:  Injection of right knee under ultrasound guidance. Ultrasound guidance utilized for anteromedial approach, joint space visualized, no effusion. Samsung HS60 device utilized with permanent recording / reporting. Verbal informed consent obtained and verified. Skin prepped in a sterile fashion. Ethyl chloride for topical local analgesia.  Completed without difficulty and tolerated well. Medication: Orthovisc prefilled syringe 30 mg / 2 mL was injected into the knee through a 22-gauge needle. Advised to contact for fevers/chills, erythema, induration, drainage, or persistent bleeding.  Pertinent History, Exam, Impression, and Recommendations:   Problem List Items Addressed This Visit     Primary osteoarthritis of right knee - Primary   History of Present Illness [name] is a 73 year old female with right knee osteoarthritis who presents for a follow-up on gel injections.  Right knee pain and swelling - Chronic right knee osteoarthritis with recurrent episodes of swelling and severe pain over the past two years - Initial episode of significant swelling and pain led to fluid  aspiration and initiation of gel injections two years ago - Recent episode of severe knee swelling and pain resulted in inability to ambulate - Received a steroid injection during the most recent flare, followed by continuation of gel injections - Currently on the second injection of the current gel series, with two additional injections scheduled  Functional status and physical activity - Engages in daily gym workouts and swimming, which she believes contribute to improved knee function - Attends physical therapy at a sports facility  Pain management - Uses Tylenol  8-hour arthritis as needed for pain control - Avoids NSAIDs such as Aleve  and ibuprofen due to concerns about kidney function - History of psoriasis, leading to avoidance of topical treatments such as Voltaren  gel  Knee support devices - Difficulty with knee brace due to improper sizing (too large) - Not wearing the brace regularly - Considering obtaining a smaller size for improved support  Physical Exam INSPECTION: Right knee with no swelling, erythema, ecchymosis, atrophy, deformity, or skin changes.  RADIOLOGY Right knee X-ray (04/2021): Tricompartmental OA with primary focality medial tibiofemoral junction  Assessment and Plan Primary osteoarthritis of right knee Chronic osteoarthritis with recent flare-up managed by steroid and gel injections. High activity level aids symptom management. Gel injections expected to provide extended relief. - Administer second gel injection in current series. - Schedule two additional gel injections. - Advise use of ice on the knee for 20 minutes post-activity. - Recommend continuation of gym activities focusing on upper body for two days. - Advise against NSAIDs due to kidney function concerns. - Consider Voltaren  gel for pain management, with caution due  to psoriasis. - Arrange for a smaller knee brace from [employer] if current one is too large.  Psoriasis Psoriasis may be  aggravated by topical treatments. Caution advised with Voltaren  gel. - Test small amount of Voltaren  gel on skin to check for irritation. - Wipe off Voltaren  gel after 15 minutes to minimize skin irritation.      Relevant Orders   US  LIMITED JOINT SPACE STRUCTURES LOW RIGHT     Orders & Medications Medications:  Meds ordered this encounter  Medications   Hyaluronan (ORTHOVISC) intra-articular injection 30 mg   Orders Placed This Encounter  Procedures   US  LIMITED JOINT SPACE STRUCTURES LOW RIGHT     No follow-ups on file.     Selinda JINNY Ku, MD, [facility]   Primary Care Sports Medicine Primary Care and Sports Medicine at [facility] [place]

## 2024-06-16 NOTE — Assessment & Plan Note (Signed)
 History of Present Illness Glenda Burke is a 73 year old female with right knee osteoarthritis who presents for a follow-up on gel injections.  Right knee pain and swelling - Chronic right knee osteoarthritis with recurrent episodes of swelling and severe pain over the past two years - Initial episode of significant swelling and pain led to fluid aspiration and initiation of gel injections two years ago - Recent episode of severe knee swelling and pain resulted in inability to ambulate - Received a steroid injection during the most recent flare, followed by continuation of gel injections - Currently on the second injection of the current gel series, with two additional injections scheduled  Functional status and physical activity - Engages in daily gym workouts and swimming, which she believes contribute to improved knee function - Attends physical therapy at a sports facility  Pain management - Uses Tylenol  8-hour arthritis as needed for pain control - Avoids NSAIDs such as Aleve  and ibuprofen due to concerns about kidney function - History of psoriasis, leading to avoidance of topical treatments such as Voltaren  gel  Knee support devices - Difficulty with knee brace due to improper sizing (too large) - Not wearing the brace regularly - Considering obtaining a smaller size for improved support  Physical Exam INSPECTION: Right knee with no swelling, erythema, ecchymosis, atrophy, deformity, or skin changes.  RADIOLOGY Right knee X-ray (04/2021): Tricompartmental OA with primary focality medial tibiofemoral junction  Assessment and Plan Primary osteoarthritis of right knee Chronic osteoarthritis with recent flare-up managed by steroid and gel injections. High activity level aids symptom management. Gel injections expected to provide extended relief. - Administer second gel injection in current series. - Schedule two additional gel injections. - Advise use of ice on the knee for  20 minutes post-activity. - Recommend continuation of gym activities focusing on upper body for two days. - Advise against NSAIDs due to kidney function concerns. - Consider Voltaren  gel for pain management, with caution due to psoriasis. - Arrange for a smaller knee brace from [employer] if current one is too large.  Psoriasis Psoriasis may be aggravated by topical treatments. Caution advised with Voltaren  gel. - Test small amount of Voltaren  gel on skin to check for irritation. - Wipe off Voltaren  gel after 15 minutes to minimize skin irritation.

## 2024-06-17 DIAGNOSIS — L400 Psoriasis vulgaris: Secondary | ICD-10-CM | POA: Diagnosis not present

## 2024-06-18 ENCOUNTER — Ambulatory Visit: Admitting: Sports Medicine

## 2024-06-22 ENCOUNTER — Ambulatory Visit: Admitting: Sports Medicine

## 2024-06-23 ENCOUNTER — Ambulatory Visit: Admitting: Family Medicine

## 2024-06-24 ENCOUNTER — Ambulatory Visit (INDEPENDENT_AMBULATORY_CARE_PROVIDER_SITE_OTHER): Admitting: Family Medicine

## 2024-06-24 ENCOUNTER — Other Ambulatory Visit (INDEPENDENT_AMBULATORY_CARE_PROVIDER_SITE_OTHER): Payer: Self-pay | Admitting: Radiology

## 2024-06-24 VITALS — BP 116/70 | HR 88 | Ht 61.0 in | Wt 187.0 lb

## 2024-06-24 DIAGNOSIS — M1711 Unilateral primary osteoarthritis, right knee: Secondary | ICD-10-CM

## 2024-06-24 MED ORDER — HYALURONAN 30 MG/2ML IX SOSY
30.0000 mg | PREFILLED_SYRINGE | Freq: Once | INTRA_ARTICULAR | Status: AC
  Administered 2024-06-24: 30 mg via INTRA_ARTICULAR

## 2024-06-24 NOTE — Progress Notes (Signed)
 Primary Care / Sports Medicine Office Visit  Patient Information:  Patient ID: Glenda Burke, female DOB: 1951-09-20 Age: 73 y.o. MRN: [id number]   Glenda Burke is a pleasant 73 y.o. female presenting with the following:  Chief Complaint  Patient presents with   Injections    Patient presents for her second Orthovisc at the office. She is not feeling any improvements in her right knee yet.     Vitals:   06/24/24 1335  BP: 116/70  Pulse: 88  SpO2: 98%   Vitals:   06/24/24 1335  Weight: 187 lb (84.8 kg)  Height: 5' 1 (1.549 m)   Body mass index is 35.33 kg/m.  Discussed the use of AI scribe software for clinical note transcription with the patient, who gave verbal consent to proceed.   Independent interpretation of notes and tests performed by another provider:   None  Procedures performed:   Procedure:  Injection of right knee under ultrasound guidance. Ultrasound guidance used for anterolateral approach Samsung HS60 device utilized with permanent recording / reporting. Verbal informed consent obtained and verified. Skin prepped in a sterile fashion. Ethyl chloride for topical local analgesia.  Completed without difficulty and tolerated well. Medication: Monovisc 88 mg (4 mL) Advised to contact for fevers/chills, erythema, induration, drainage, or persistent bleeding.   Pertinent History, Exam, Impression, and Recommendations:   Problem List Items Addressed This Visit     Primary osteoarthritis of right knee - Primary   History of Present Illness Glenda Burke is a 73 year old female with right knee OA who presents for an Orthovisc injection due to persistent knee pain.  Knee pain and functional impairment - Prior to the cortisone injection (8/21), pain was severe and made walking almost impossible; initial relief was achieved with the injection, but pain has since returned but not to previous severity - Meniscal degeneration and moderate  peripheral extrusion of the meniscus on MRI from July 2023 - Increased use of a knee brace due to worsening pain - Considering use of crutches or a cane for an upcoming trip  Analgesic use and response - Currently using Tylenol  Arthritis 8-hour for pain management with minimal relief - Previously used naproxen  but discontinued due to concerns about kidney function - Has access to Voltaren  gel and has used lidocaine patches in the past  Travel-related concerns - Planning travel to [place] and [place] from September 22 to July 30, 2024 - Concerned about managing knee pain and mobility during travel  Results RADIOLOGY Knee MRI: Meniscal degeneration and moderate peripheral extrusion of the meniscus, root intact, no focal tear. (04/15/2022)  Assessment and Plan Right knee osteoarthritis with meniscal degeneration Persistent knee pain unrelieved by cortisone suggests pain may originate from soft tissue damage or bone edema. MRI showed meniscal degeneration and moderate peripheral extrusion. - Administer Orthovisc injection for lubrication, effect expected in 4-6 weeks. - Monitor pain; report if no improvement in 4-6 weeks after final injection for potential MRI. - Avoid strenuous activities; use knee brace as needed. - Discuss use of cane or trekking stick to reduce knee load. - Advise against NSAIDs; recommend Tylenol  for pain. - Consider lidocaine patches or Voltaren  gel, avoiding injection site. - Plan to discuss travel medications on September 17.      Relevant Orders   US  LIMITED JOINT SPACE STRUCTURES LOW RIGHT     Orders & Medications Medications:  Meds ordered this encounter  Medications   Hyaluronan (ORTHOVISC) intra-articular injection 30 mg   Orders  Placed This Encounter  Procedures   US  LIMITED JOINT SPACE STRUCTURES LOW RIGHT     No follow-ups on file.     Selinda JINNY Ku, MD, [facility]   Primary Care Sports Medicine Primary Care and Sports Medicine at  [facility] [place]

## 2024-06-24 NOTE — Assessment & Plan Note (Signed)
 History of Present Illness Glenda Burke is a 73 year old female with right knee OA who presents for an Orthovisc injection due to persistent knee pain.  Knee pain and functional impairment - Prior to the cortisone injection (8/21), pain was severe and made walking almost impossible; initial relief was achieved with the injection, but pain has since returned but not to previous severity - Meniscal degeneration and moderate peripheral extrusion of the meniscus on MRI from July 2023 - Increased use of a knee brace due to worsening pain - Considering use of crutches or a cane for an upcoming trip  Analgesic use and response - Currently using Tylenol  Arthritis 8-hour for pain management with minimal relief - Previously used naproxen  but discontinued due to concerns about kidney function - Has access to Voltaren  gel and has used lidocaine patches in the past  Travel-related concerns - Planning travel to [place] and [place] from September 22 to July 30, 2024 - Concerned about managing knee pain and mobility during travel  Results RADIOLOGY Knee MRI: Meniscal degeneration and moderate peripheral extrusion of the meniscus, root intact, no focal tear. (04/15/2022)  Assessment and Plan Right knee osteoarthritis with meniscal degeneration Persistent knee pain unrelieved by cortisone suggests pain may originate from soft tissue damage or bone edema. MRI showed meniscal degeneration and moderate peripheral extrusion. - Administer Orthovisc injection for lubrication, effect expected in 4-6 weeks. - Monitor pain; report if no improvement in 4-6 weeks after final injection for potential MRI. - Avoid strenuous activities; use knee brace as needed. - Discuss use of cane or trekking stick to reduce knee load. - Advise against NSAIDs; recommend Tylenol  for pain. - Consider lidocaine patches or Voltaren  gel, avoiding injection site. - Plan to discuss travel medications on September 17.

## 2024-06-25 ENCOUNTER — Ambulatory Visit: Admitting: Sports Medicine

## 2024-06-29 ENCOUNTER — Ambulatory Visit: Admitting: Sports Medicine

## 2024-06-30 ENCOUNTER — Ambulatory Visit (INDEPENDENT_AMBULATORY_CARE_PROVIDER_SITE_OTHER): Admitting: Family Medicine

## 2024-06-30 ENCOUNTER — Ambulatory Visit: Admitting: Family Medicine

## 2024-06-30 ENCOUNTER — Encounter: Payer: Self-pay | Admitting: Family Medicine

## 2024-06-30 ENCOUNTER — Other Ambulatory Visit (INDEPENDENT_AMBULATORY_CARE_PROVIDER_SITE_OTHER): Payer: Self-pay | Admitting: Radiology

## 2024-06-30 VITALS — BP 120/74 | HR 83 | Ht 61.0 in | Wt 188.0 lb

## 2024-06-30 DIAGNOSIS — M1711 Unilateral primary osteoarthritis, right knee: Secondary | ICD-10-CM | POA: Diagnosis not present

## 2024-06-30 MED ORDER — HYALURONAN 30 MG/2ML IX SOSY
30.0000 mg | PREFILLED_SYRINGE | Freq: Once | INTRA_ARTICULAR | Status: AC
  Administered 2024-06-30: 30 mg via INTRA_ARTICULAR

## 2024-06-30 MED ORDER — TRAMADOL HCL 50 MG PO TABS: 50.0000 mg | ORAL_TABLET | Freq: Three times a day (TID) | ORAL | 0 refills | Status: AC | PRN

## 2024-06-30 NOTE — Progress Notes (Signed)
 Primary Care / Sports Medicine Office Visit  Patient Information:  Patient ID: Glenda Burke, female DOB: [birth year] Age: 73 y.o. MRN: [id number]   Glenda Burke is a pleasant 73 y.o. female presenting with the following:  Chief Complaint  Patient presents with   Injections    Patient presents today for her third orthovisc HA injection for her right knee. She's had some improvement in her knee pain since her last injection.    Vitals:   06/30/24 1036  BP: 120/74  Pulse: 83  SpO2: 99%   Vitals:   06/30/24 1036  Weight: 188 lb (85.3 kg)  Height: 5' 1 (1.549 m)   Body mass index is 35.52 kg/m.     Discussed the use of AI scribe software for clinical note transcription with the patient, who gave verbal consent to proceed.   Independent interpretation of notes and tests performed by another provider:   None  Procedures performed:   Procedure:  Injection of Right knee under ultrasound guidance. .smartlist Ultrasound guidance utilized for anteromedial approach Samsung HS60 device utilized with permanent recording / reporting. Verbal informed consent obtained and verified. Skin prepped in a sterile fashion. Ethyl chloride for topical local analgesia.  Completed without difficulty and tolerated well. Medication: Monovisc 88 mg (4 mL) Advised to contact for fevers/chills, erythema, induration, drainage, or persistent bleeding.   Pertinent History, Exam, Impression, and Recommendations:   Problem List Items Addressed This Visit     Primary osteoarthritis of right knee - Primary   History of Present Illness Glenda Burke is a 73 year old female who presents for follow-up on her knee injections.  Knee pain and swelling - Chronic knee pain with gradual improvement following each injection - Current sensation described as pressure rather than pain, which is more manageable - Swelling present in the knee area, described as similar to a Baker's cyst - Pain  and swelling exacerbated by activities involving extensive walking  Analgesic and anti-inflammatory medication use - Uses topical Voltaren  gel up to four times daily for symptomatic relief - Takes Tylenol  Arthritis, two tablets up to three times daily as needed for pain - Uses naproxen  sparingly, particularly with increased activity - Prescribed tramadol  for severe pain, especially during upcoming travel  Medication allergies - History of allergic reaction to hydrocodone , manifested as mild swelling without recall of respiratory compromise  Functional status and travel plans - Preparing for travel to [place] and [place] - Pain management regimen adjusted in anticipation of increased activity during travel  Physical Exam INSPECTION: Early signs of a small Baker's cyst in the posterior knee.  Assessment and Plan Primary osteoarthritis of the right knee with associated effusion and small Baker's cyst Positive response to injections. Swelling suggests possible Baker's cyst from fluid backup. Though presenting for scheduled visit, we discussed additional treatments for upcoming travel inclusive of new Rx for interim and breakthrough pain control. - Administer final injection of Monovisc. - Advise ice application post-injection. - Recommend Voltaren  gel up to four times daily to clean, unbroken skin. - Advise Tylenol  Arthritis, two tablets up to three times day. - Prescribe tramadol  for severe pain, caution for allergies. - Discuss lidocaine patches or roll-ons for localized pain. - Instruct to contact or schedule an appointment if needed before [place] trip.      Relevant Medications   traMADol  (ULTRAM ) 50 MG tablet   Other Relevant Orders   US  LIMITED JOINT SPACE STRUCTURES LOW RIGHT     Orders & Medications Medications:  Meds ordered this encounter  Medications   Hyaluronan (ORTHOVISC) intra-articular injection 30 mg   traMADol  (ULTRAM ) 50 MG tablet    Sig: Take 1 tablet (50 mg  total) by mouth every 8 (eight) hours as needed for up to 5 days.    Dispense:  15 tablet    Refill:  0   Orders Placed This Encounter  Procedures   US  LIMITED JOINT SPACE STRUCTURES LOW RIGHT     No follow-ups on file.     Selinda JINNY Ku, MD, [facility]   Primary Care Sports Medicine Primary Care and Sports Medicine at [facility]

## 2024-06-30 NOTE — Assessment & Plan Note (Addendum)
 History of Present Illness Glenda Burke is a 73 year old female who presents for follow-up on her knee injections.  Knee pain and swelling - Chronic knee pain with gradual improvement following each injection - Current sensation described as pressure rather than pain, which is more manageable - Swelling present in the knee area, described as similar to a Baker's cyst - Pain and swelling exacerbated by activities involving extensive walking  Analgesic and anti-inflammatory medication use - Uses topical Voltaren  gel up to four times daily for symptomatic relief - Takes Tylenol  Arthritis, two tablets up to three times daily as needed for pain - Uses naproxen  sparingly, particularly with increased activity - Prescribed tramadol  for severe pain, especially during upcoming travel  Medication allergies - History of allergic reaction to hydrocodone , manifested as mild swelling without recall of respiratory compromise  Functional status and travel plans - Preparing for travel to [place] and [place] - Pain management regimen adjusted in anticipation of increased activity during travel  Physical Exam INSPECTION: Early signs of a small Baker's cyst in the posterior knee.  Assessment and Plan Primary osteoarthritis of the right knee with associated effusion and small Baker's cyst Positive response to injections. Swelling suggests possible Baker's cyst from fluid backup. Though presenting for scheduled visit, we discussed additional treatments for upcoming travel inclusive of new Rx for interim and breakthrough pain control. - Administer final injection of Monovisc. - Advise ice application post-injection. - Recommend Voltaren  gel up to four times daily to clean, unbroken skin. - Advise Tylenol  Arthritis, two tablets up to three times day. - Prescribe tramadol  for severe pain, caution for allergies. - Discuss lidocaine patches or roll-ons for localized pain. - Instruct to contact or schedule an  appointment if needed before [place] trip.

## 2024-06-30 NOTE — Patient Instructions (Signed)
 Patient Plan  Right Knee Osteoarthritis with Effusion and Baker's Cyst  - Final Monovisc injection administered. - Apply ice to the knee after the injection as needed. - Use Voltaren  gel up to four times daily on clean, unbroken skin. - Take Tylenol  Arthritis, two tablets up to three times a day as needed for pain. - Use tramadol  for severe pain if needed; be aware of any allergies. - Consider lidocaine patches or roll-ons for additional localized pain relief. - Contact the office or schedule an appointment if you have concerns or need further care before your [place] trip.  Red flags - seek care if you notice:  - Sudden increase in knee swelling, redness, or warmth - Fever or chills - Severe pain not relieved by medication - New or worsening symptoms that concern you

## 2024-07-06 ENCOUNTER — Ambulatory Visit: Admitting: Sports Medicine

## 2024-08-02 ENCOUNTER — Ambulatory Visit (INDEPENDENT_AMBULATORY_CARE_PROVIDER_SITE_OTHER): Admitting: Family Medicine

## 2024-08-02 ENCOUNTER — Encounter: Payer: Self-pay | Admitting: Family Medicine

## 2024-08-02 VITALS — BP 110/80 | HR 92 | Ht 61.0 in | Wt 189.0 lb

## 2024-08-02 DIAGNOSIS — M1711 Unilateral primary osteoarthritis, right knee: Secondary | ICD-10-CM

## 2024-08-02 DIAGNOSIS — M7918 Myalgia, other site: Secondary | ICD-10-CM

## 2024-08-02 DIAGNOSIS — G8929 Other chronic pain: Secondary | ICD-10-CM | POA: Diagnosis not present

## 2024-08-02 MED ORDER — DULOXETINE HCL 60 MG PO CPEP: 60.0000 mg | ORAL_CAPSULE | Freq: Every day | ORAL | 0 refills | Status: AC

## 2024-08-02 MED ORDER — DULOXETINE HCL 30 MG PO CPEP: 30.0000 mg | ORAL_CAPSULE | Freq: Every day | ORAL | 0 refills | Status: AC

## 2024-08-02 NOTE — Progress Notes (Signed)
 Primary Care / Sports Medicine Office Visit  Patient Information:  Patient ID: Glenda Burke, female DOB: December 24, 1950 Age: 73 y.o. MRN: [id number]   Glenda Burke is a pleasant 74 y.o. female presenting with the following:  Chief Complaint  Patient presents with   Knee Pain    Patient follow up on her right knee. She continues to have pain in her right knee. She continues to have difficulty with walking. He gait is off due to her knee pain.     Vitals:   08/02/24 1312  BP: 110/80  Pulse: 92  SpO2: 99%   Vitals:   08/02/24 1312  Weight: 189 lb (85.7 kg)  Height: 5' 1 (1.549 m)   Body mass index is 35.71 kg/m.  No results found.   Discussed the use of AI scribe software for clinical note transcription with the patient, who gave verbal consent to proceed.   Independent interpretation of notes and tests performed by another provider:   None  Procedures performed:   None  Pertinent History, Exam, Impression, and Recommendations:   Problem List Items Addressed This Visit     Primary osteoarthritis of right knee - Primary   History of Present Illness Glenda Burke is a 73 year old female with knee osteoarthritis who presents with persistent right knee pain.  Right knee pain - Persistent right knee pain since June with severe worsening by June 03, 2024, following extensive walking during a trip to [place] in June and July 2025 - Pain characterized by fluctuating severity with 'good days and bad days', but overall worsening since onset - No significant relief from cortisone injection administered in August 2025 - Voltaren  gel and lidocaine patches used for pain management with limited benefit - Tramadol  prescribed but not taken due to concerns about side effects and uncertainty regarding use - Tylenol , even at high doses, does not alleviate pain - Previous history of knee injections for pain management  Functional impairment and impact on quality of  life - Significant limitation of daily activities due to knee pain - Avoidance of previously enjoyed activities such as walking and social outings - Difficulty performing household chores and assisting husband during his hospital stay - Weight gain of approximately ten pounds attributed to reduced physical activity - No engagement in regular exercise secondary to knee pain  Assessment and Plan Right knee osteoarthritis with chronic pain Chronic pain in the right knee exacerbated by activity. Previous cortisone injection and viscosupplementation series provided limited relief. Current management includes Voltaren  gel and lidocaine patches. Discussed Cymbalta for chronic musculoskeletal pain, its mechanism, FDA approval, potential side effects, and onset time. Discussed procedural options and knee replacement if non-surgical options fail. - Start Cymbalta 30 mg daily for one week, then increase to 60 mg daily. - Schedule follow-up in 2 months to assess response to Cymbalta. - Use tramadol  as needed for breakthrough pain. - Consider genicular nerve block or artery embolization if pain persists. - Consider low dose radiation treatment (LDRT) if pain persists. - Discuss knee replacement surgery if non-surgical options are ineffective.       Relevant Medications   DULoxetine (CYMBALTA) 30 MG capsule   DULoxetine (CYMBALTA) 60 MG capsule   Other Visit Diagnoses       Chronic musculoskeletal pain       Relevant Medications   DULoxetine (CYMBALTA) 30 MG capsule   DULoxetine (CYMBALTA) 60 MG capsule        Orders & Medications Medications:  Meds ordered  this encounter  Medications   DULoxetine (CYMBALTA) 30 MG capsule    Sig: Take 1 capsule (30 mg total) by mouth daily for 7 days. Then increase to duloxetine 60 mg.    Dispense:  7 capsule    Refill:  0   DULoxetine (CYMBALTA) 60 MG capsule    Sig: Take 1 capsule (60 mg total) by mouth daily.    Dispense:  90 capsule    Refill:  0    No orders of the defined types were placed in this encounter.    No follow-ups on file.     Selinda JINNY Ku, MD, [facility]   Primary Care Sports Medicine Primary Care and Sports Medicine at [facility]

## 2024-08-02 NOTE — Assessment & Plan Note (Signed)
 History of Present Illness Glenda Burke is a 73 year old female with knee osteoarthritis who presents with persistent right knee pain.  Right knee pain - Persistent right knee pain since June with severe worsening by June 03, 2024, following extensive walking during a trip to [place] in June and July 2025 - Pain characterized by fluctuating severity with 'good days and bad days', but overall worsening since onset - No significant relief from cortisone injection administered in August 2025 - Voltaren  gel and lidocaine patches used for pain management with limited benefit - Tramadol  prescribed but not taken due to concerns about side effects and uncertainty regarding use - Tylenol , even at high doses, does not alleviate pain - Previous history of knee injections for pain management  Functional impairment and impact on quality of life - Significant limitation of daily activities due to knee pain - Avoidance of previously enjoyed activities such as walking and social outings - Difficulty performing household chores and assisting husband during his hospital stay - Weight gain of approximately ten pounds attributed to reduced physical activity - No engagement in regular exercise secondary to knee pain  Assessment and Plan Right knee osteoarthritis with chronic pain Chronic pain in the right knee exacerbated by activity. Previous cortisone injection and viscosupplementation series provided limited relief. Current management includes Voltaren  gel and lidocaine patches. Discussed Cymbalta for chronic musculoskeletal pain, its mechanism, FDA approval, potential side effects, and onset time. Discussed procedural options and knee replacement if non-surgical options fail. - Start Cymbalta 30 mg daily for one week, then increase to 60 mg daily. - Schedule follow-up in 2 months to assess response to Cymbalta. - Use tramadol  as needed for breakthrough pain. - Consider genicular nerve block or artery  embolization if pain persists. - Consider low dose radiation treatment (LDRT) if pain persists. - Discuss knee replacement surgery if non-surgical options are ineffective.

## 2024-08-02 NOTE — Patient Instructions (Signed)
 VISIT SUMMARY:  During your visit, we discussed your persistent right knee pain and its impact on your daily activities.   YOUR PLAN:  RIGHT KNEE OSTEOARTHRITIS WITH CHRONIC PAIN: You have chronic pain in your right knee that has worsened since August 2025. Previous treatments have provided limited relief. -Start taking Cymbalta 30 mg daily for one week, then increase to 60 mg daily. -Schedule a follow-up appointment in 2 months to assess your response to Cymbalta. -Use tramadol  as needed for breakthrough pain. -If pain persists, consider genicular nerve block or artery embolization. -If pain persists, consider low dose radiation treatment (LDRT). -Discuss knee replacement surgery if non-surgical options are ineffective.

## 2024-08-03 ENCOUNTER — Ambulatory Visit: Admitting: Family Medicine

## 2024-08-10 NOTE — Telephone Encounter (Signed)
 Emailed Ryan from [employer] on response.  JM

## 2024-08-23 ENCOUNTER — Telehealth: Payer: Self-pay | Admitting: Student

## 2024-08-23 ENCOUNTER — Other Ambulatory Visit: Payer: Self-pay | Admitting: Student

## 2024-08-23 ENCOUNTER — Ambulatory Visit (INDEPENDENT_AMBULATORY_CARE_PROVIDER_SITE_OTHER): Admitting: Student

## 2024-08-23 ENCOUNTER — Other Ambulatory Visit: Payer: Self-pay

## 2024-08-23 ENCOUNTER — Encounter: Payer: Self-pay | Admitting: Student

## 2024-08-23 VITALS — BP 130/88 | HR 74 | Ht 61.0 in

## 2024-08-23 DIAGNOSIS — N1832 Chronic kidney disease, stage 3b: Secondary | ICD-10-CM | POA: Diagnosis not present

## 2024-08-23 DIAGNOSIS — E7800 Pure hypercholesterolemia, unspecified: Secondary | ICD-10-CM | POA: Diagnosis not present

## 2024-08-23 DIAGNOSIS — I10 Essential (primary) hypertension: Secondary | ICD-10-CM | POA: Diagnosis not present

## 2024-08-23 DIAGNOSIS — M1711 Unilateral primary osteoarthritis, right knee: Secondary | ICD-10-CM

## 2024-08-23 DIAGNOSIS — L409 Psoriasis, unspecified: Secondary | ICD-10-CM

## 2024-08-23 DIAGNOSIS — D649 Anemia, unspecified: Secondary | ICD-10-CM

## 2024-08-23 DIAGNOSIS — E119 Type 2 diabetes mellitus without complications: Secondary | ICD-10-CM

## 2024-08-23 NOTE — Assessment & Plan Note (Addendum)
 Currently on losartan -hydrochlorothiazide 100-25 mg daily. Does have hx of chronic venous insufficiency. BP 130/88 today, appears higher than prior measurements will have her monitor home BP. Continue current medication. BMP today.

## 2024-08-23 NOTE — Assessment & Plan Note (Addendum)
 Lipid Panel     Component Value Date/Time   CHOL 158 04/12/2024 0956   TRIG 64 04/12/2024 0956   HDL 69 04/12/2024 0956   CHOLHDL 2.3 04/12/2024 0956   CHOLHDL 2.4 02/03/2023 1048   VLDL 15.0 12/17/2017 0835   LDLCALC 76 04/12/2024 0956   LDLCALC 78 02/03/2023 1048   LABVLDL 13 04/12/2024 0956  The 10-year ASCVD risk score (Arnett DK, et al., 2019) is: 26.8%. Currently on atorvastatin  10 mg daily, may benefit from high intensity statin, lipid panel at next visit.

## 2024-08-23 NOTE — Assessment & Plan Note (Addendum)
 Doing well on calcipotriene  for maintenance and clobetasol  as needed. Sees dermatology at [facility].

## 2024-08-23 NOTE — Telephone Encounter (Signed)
 error

## 2024-08-26 ENCOUNTER — Ambulatory Visit: Payer: Self-pay | Admitting: Student

## 2024-08-26 LAB — BASIC METABOLIC PANEL WITH GFR
BUN/Creatinine Ratio: 21 (ref 12–28)
BUN: 31 mg/dL — ABNORMAL HIGH (ref 8–27)
CO2: 24 mmol/L (ref 20–29)
Calcium: 9.2 mg/dL (ref 8.7–10.3)
Chloride: 99 mmol/L (ref 96–106)
Creatinine, Ser: 1.45 mg/dL — ABNORMAL HIGH (ref 0.57–1.00)
Glucose: 96 mg/dL (ref 70–99)
Potassium: 3.9 mmol/L (ref 3.5–5.2)
Sodium: 139 mmol/L (ref 134–144)
eGFR: 38 mL/min/1.73 — ABNORMAL LOW (ref >59)

## 2024-08-26 LAB — HEMOGLOBIN A1C
Est. average glucose Bld gHb Est-mCnc: 134 mg/dL
Hgb A1c MFr Bld: 6.3 % — ABNORMAL HIGH (ref 4.8–5.6)

## 2024-08-30 ENCOUNTER — Encounter: Payer: Self-pay | Admitting: Student

## 2024-08-30 NOTE — Assessment & Plan Note (Signed)
 Last A1c 3.5 on 04/12/2024. Currently diet controlled. Discussed dietary and lifestyle modifications. A1c today. Foot exam today, no acute abnormalities. Needs to schedule diabetic eye exam.

## 2024-08-30 NOTE — Progress Notes (Signed)
 New Patient Office Visit  Subjective    Patient ID: Glenda Burke, female    DOB: 1951/04/07  Age: 73 y.o. MRN: [id number]  CC:  Chief Complaint  Patient presents with   Establish Care    HPI Glenda Burke is a 73 y.o. person living with T2DM,  presents to establish care. Feeling well aside from R knee pain today. Seeing Dr. Alvia for R knee OA. Please refer to problem based charting for further details and assessment and plan of current problem and chronic medical conditions.   Outpatient Encounter Medications as of 08/23/2024  Medication Sig   acetaminophen  (TYLENOL ) 650 MG CR tablet 1 tablet p.o. 2 times to 3 times a day.   aspirin 81 MG tablet Take 81 mg by mouth 3 (three) times a week.   atorvastatin  (LIPITOR) 10 MG tablet TAKE 1 TABLET (10 MG TOTAL) BY MOUTH DAILY AT 6 PM.   calcipotriene  (DOVONOX) 0.005 % cream Apply topically 2 (two) times daily.   Calcium  Carbonate-Vit D-Min (CALCIUM  1200 PO) Take by mouth daily.   clobetasol  ointment (TEMOVATE ) 0.05 % APPLY 1 APPLICATION. TOPICALLY 2 (TWO) TIMES DAILY.   Diclofenac  Sodium 2 % SOLN Place 2 sprays onto the skin 2 (two) times daily. (Patient taking differently: Place 2 sprays onto the skin 2 (two) times daily as needed.)   DULoxetine (CYMBALTA) 30 MG capsule Take 1 capsule (30 mg total) by mouth daily for 7 days. Then increase to duloxetine 60 mg.   estradiol  (ESTRACE  VAGINAL) 0.1 MG/GM vaginal cream Place 1 Applicatorful vaginally 3 (three) times a week.   losartan -hydrochlorothiazide (HYZAAR) 100-25 MG tablet TAKE 1 TABLET BY MOUTH EVERY DAY   DULoxetine (CYMBALTA) 60 MG capsule Take 1 capsule (60 mg total) by mouth daily. (Patient not taking: Reported on 08/23/2024)   [DISCONTINUED] meloxicam  (MOBIC ) 15 MG tablet One tab PO every 24 hours with a meal for 2 weeks, then once every 24 hours prn pain. (Patient not taking: Reported on 08/23/2024)   [DISCONTINUED] Minoxidil  5 % FOAM Apply 1 application topically 2 (two)  times daily. Apply twice daily for up to 4 months (Patient not taking: Reported on 08/23/2024)   No facility-administered encounter medications on file as of 08/23/2024.    Past Medical History:  Diagnosis Date   Allergy    Anemia    Arthritis    Bilateral shoulder pain 12/07/2014   Cataract    Hyperlipidemia    Hypertension    Menopausal state     Past Surgical History:  Procedure Laterality Date   arm surgery Left    plate and screw   COLONOSCOPY  ?   out of state   DILATION AND CURETTAGE OF UTERUS     polypectomy      Family History  Problem Relation Age of Onset   Hypertension Mother    Hyperlipidemia Mother    Congestive Heart Failure Mother    Stroke Father    Diabetes Father    Hyperlipidemia Father    Hypertension Father    Hypertension Sister    Hyperlipidemia Sister    Diabetes Sister    Kidney disease Sister        on HD   Kidney disease Brother        kidney transplant    Hypertension Brother    Hyperlipidemia Brother    Diabetes Brother    Colon cancer Neg Hx    Stomach cancer Neg Hx    Breast cancer Neg Hx  Esophageal cancer Neg Hx    Rectal cancer Neg Hx     Social History   Socioeconomic History   Marital status: Married    Spouse name: Not on file   Number of children: Not on file   Years of education: Not on file   Highest education level: Not on file  Occupational History   Occupation: retired  Tobacco Use   Smoking status: Never   Smokeless tobacco: Never  Vaping Use   Vaping status: Never Used  Substance and Sexual Activity   Alcohol use: Yes    Comment: wine occasional   Drug use: No   Sexual activity: Yes    Partners: Male  Other Topics Concern   Not on file  Social History Narrative   Retired [employer]    Married    From [place]    Likes to travel    Social Drivers of Corporate Investment Banker Strain: Not on file  Food Insecurity: No Food Insecurity (08/23/2024)   Hunger Vital Sign    Worried About  Programme Researcher, Broadcasting/film/video in the Last Year: Never true    Ran Out of Food in the Last Year: Never true  Transportation Needs: No Transportation Needs (08/23/2024)   PRAPARE - Administrator, Civil Service (Medical): No    Lack of Transportation (Non-Medical): No  Physical Activity: Not on file  Stress: Not on file  Social Connections: Not on file  Intimate Partner Violence: Not At Risk (08/23/2024)   Humiliation, Afraid, Rape, and Kick questionnaire    Fear of Current or Ex-Partner: No    Emotionally Abused: No    Physically Abused: No    Sexually Abused: No    ROS Refer to HPI    Objective   BP 130/88   Pulse 74   Ht 5' 1 (1.549 m)   SpO2 96%   BMI 35.71 kg/m   Physical Exam Constitutional:      Appearance: Normal appearance.  HENT:     Head: Normocephalic and atraumatic.     Mouth/Throat:     Mouth: Mucous membranes are moist.     Pharynx: Oropharynx is clear.  Cardiovascular:     Rate and Rhythm: Normal rate and regular rhythm.     Pulses: Normal pulses.     Heart sounds: No murmur heard. Pulmonary:     Effort: Pulmonary effort is normal.     Breath sounds: No rhonchi or rales.  Abdominal:     General: Abdomen is flat. Bowel sounds are normal. There is no distension.     Palpations: Abdomen is soft.     Tenderness: There is no abdominal tenderness.  Musculoskeletal:        General: Normal range of motion.     Right lower leg: No edema.     Left lower leg: No edema.  Skin:    General: Skin is warm and dry.     Capillary Refill: Capillary refill takes less than 2 seconds.  Neurological:     General: No focal deficit present.     Mental Status: She is alert and oriented to person, place, and time.  Psychiatric:        Mood and Affect: Mood normal.        Behavior: Behavior normal.        08/23/2024    1:55 PM 06/30/2024   10:41 AM 06/16/2024    8:24 AM  Depression screen PHQ 2/9  Decreased Interest  0 0 0  Down, Depressed, Hopeless 0 0 0  PHQ  - 2 Score 0 0 0      08/23/2024    1:55 PM 01/23/2022   10:28 AM  GAD 7 : Generalized Anxiety Score  Nervous, Anxious, on Edge 0 0  Control/stop worrying 0 0  Worry too much - different things  0  Trouble relaxing  1  Restless  0  Easily annoyed or irritable  0  Afraid - awful might happen  0  Total GAD 7 Score  1    Assessment & Plan:  Essential hypertension Assessment & Plan: Currently on losartan -hydrochlorothiazide 100-25 mg daily. Does have hx of chronic venous insufficiency. BP 130/88 today, appears higher than prior measurements will have her monitor home BP. Continue current medication. BMP today.   Orders: -     Basic metabolic panel with GFR  Psoriasis Assessment & Plan: Doing well on calcipotriene  for maintenance and clobetasol  as needed. Sees dermatology at [facility].    Pure hypercholesterolemia Assessment & Plan: Lipid Panel     Component Value Date/Time   CHOL 158 04/12/2024 0956   TRIG 64 04/12/2024 0956   HDL 69 04/12/2024 0956   CHOLHDL 2.3 04/12/2024 0956   CHOLHDL 2.4 02/03/2023 1048   VLDL 15.0 12/17/2017 0835   LDLCALC 76 04/12/2024 0956   LDLCALC 78 02/03/2023 1048   LABVLDL 13 04/12/2024 0956  The 10-year ASCVD risk score (Arnett DK, et al., 2019) is: 26.8%. Currently on atorvastatin  10 mg daily, may benefit from high intensity statin, lipid panel at next visit.     Diabetes mellitus type 2, diet-controlled (HCC) Assessment & Plan: Last A1c 3.5 on 04/12/2024. Currently diet controlled. Discussed dietary and lifestyle modifications. A1c today. Foot exam today, no acute abnormalities. Needs to schedule diabetic eye exam.    Orders: -     Hemoglobin A1c  Anemia, unspecified type Assessment & Plan: HGB stable on labs from 04/12/2024.   Stage 3b chronic kidney disease (HCC) Assessment & Plan: GFR 44 on 04/12/2024. BMP today. Likely due to HTN and T2DM.      Return in about 4 months (around 12/21/2024) for DM.   Harlene Saddler, MD

## 2024-08-30 NOTE — Assessment & Plan Note (Signed)
 HGB stable on labs from 04/12/2024.

## 2024-08-30 NOTE — Assessment & Plan Note (Addendum)
 GFR 44 on 04/12/2024. BMP today. Likely due to HTN and T2DM.

## 2024-09-01 ENCOUNTER — Telehealth: Payer: Self-pay

## 2024-09-01 NOTE — Telephone Encounter (Signed)
 Copied from CRM #[id number]. Topic: Referral - Status >> Sep 01, 2024 11:27 AM Charlet HERO wrote: Reason for CRM: Patient is calling about paper work that was suppose to be sent by Dr Alvia nurse to her osteo dr she is stating that it was suppose to be done in 2 days but it has been a week and she has not heard anything back. Please reach out to the patient

## 2024-09-01 NOTE — Telephone Encounter (Signed)
 Spoke with patient regarding her recent call. She stated that she wanted to know if her physical therapy referral was placed. She has no received a call from the PT office since the referral was placed. Reviewed chart and referral was placed on 08/23/2024, provided the information of the physical therapy office. MyChart message with referral information was sent to patient. Patient verbalized understanding.

## 2024-09-21 ENCOUNTER — Telehealth: Payer: Self-pay | Admitting: Student

## 2024-09-21 NOTE — Telephone Encounter (Signed)
 Copied from CRM #[id number]. Topic: Medicare AWV >> Sep 21, 2024  9:28 AM Nathanel DEL wrote: Called LVM 09/21/2024 to sched AWV. Please schedule in office or virtual visit.   Nathanel Paschal; Care Guide Ambulatory Clinical Support [place] [facility] Direct Dial: [phone]

## 2024-10-01 ENCOUNTER — Ambulatory Visit: Admitting: Family Medicine

## 2024-10-01 ENCOUNTER — Encounter: Payer: Self-pay | Admitting: Family Medicine

## 2024-10-01 VITALS — BP 132/70 | HR 97 | Ht 61.0 in | Wt 193.0 lb

## 2024-10-01 DIAGNOSIS — G8929 Other chronic pain: Secondary | ICD-10-CM

## 2024-10-01 DIAGNOSIS — M7918 Myalgia, other site: Secondary | ICD-10-CM

## 2024-10-01 DIAGNOSIS — M1711 Unilateral primary osteoarthritis, right knee: Secondary | ICD-10-CM | POA: Diagnosis not present

## 2024-10-01 MED ORDER — DULOXETINE HCL 60 MG PO CPEP: 60.0000 mg | ORAL_CAPSULE | Freq: Every day | ORAL | 0 refills | Status: AC

## 2024-10-01 NOTE — Progress Notes (Signed)
 "    Primary Care / Sports Medicine Office Visit  Patient Information:  Patient ID: Glenda Burke, female DOB: Jun 01, 1951 Age: 73 y.o. MRN: [id number]   Glenda Burke is a pleasant 73 y.o. female presenting with the following:  Chief Complaint  Patient presents with   Knee Pain    Patient presents today with right knee pain. She recently had a 11 day trip to [place] where she done lots of walking. Her knee pain has flared up now. She has swelling and inflammation. She has been taking Cymbalta  60 mg dose but it is not helping. She would like to discuss next steps as she has had cortisone and gel injection but that is only helping short term.    Vitals:   10/01/24 1314  BP: 132/70  Pulse: 97  SpO2: 98%   Vitals:   10/01/24 1314  Weight: 193 lb (87.5 kg)  Height: 5' 1 (1.549 m)   Body mass index is 36.47 kg/m.  No results found.   Discussed the use of AI scribe software for clinical note transcription with the patient, who gave verbal consent to proceed.   Independent interpretation of notes and tests performed by another provider:   None  Procedures performed:   None  Pertinent History, Exam, Impression, and Recommendations:   History of Present Illness Glenda Burke is a 73 year old female with chronic right knee osteoarthritis who presents for follow-up of persistent right knee pain.  Right knee pain and osteoarthritis - Chronic right knee pain localized to the medial aspect, persistent with ambulation and requiring frequent rest during walking - Pain absent when sitting, standing, or lying down - Pain described as tolerable but ongoing - No pain in other joints - Symptoms stable over the past several weeks, with no significant change in severity or character compared to the period before her recent trip - No acute worsening of pain with increased walking during an 11-day trip, though fatigue was more pronounced at the end of each day - Significant fatigue  after prolonged walking, especially on uneven surfaces - Able to complete daily activities with use of a walking stick, but required evening rest due to tiredness  Current and prior pain management - Duloxetine  60 mg daily, taken consistently for at least six weeks; previously discontinued intermittently due to constipation, but resumed regular use before, during, and after recent trip - Tramadol  used as needed for breakthrough pain, but limited due to concerns about side effects - Additional therapies include topical diclofenac  gel, pain patches, and a knee brace (difficult to keep in place) - Previous intra-articular corticosteroid and hyaluronic acid injections with limited benefit - No history of advanced interventions such as nerve blocks, ablation, or PRP injections  Physical therapy and functional status - Scheduled to begin physical therapy, with interest in addressing both knee and hip mechanics due to perceived right hip tightness and abnormal gait - Previously attended physical therapy and was able to perform all exercises - Previously informed that she had more of a meniscus problem than severe arthritis - Has not yet started the current round of therapy  Results Radiology Bilateral knee X-ray (04/2021): Medial compartment joint space narrowing and cartilage loss in right knee; left knee less involved. Findings consistent with osteoarthritis, more pronounced on right medial compartment. (Independently interpreted)  Assessment and Plan Primary osteoarthritis of right knee Chronic symptomatic osteoarthritis with medial compartment pain and functional limitation despite prior conservative management. Radiographs show medial compartment joint space narrowing. MRI indicated for further  assessment. Discussed advanced non-surgical and surgical options, emphasizing elective nature of surgery and preference for total knee arthroplasty. - Ordered right knee MRI to evaluate meniscal  pathology, osteoarthritis progression, and bone edema. - Reviewed surgical options: arthroscopic meniscectomy for complex meniscal tears and total knee arthroplasty if conservative measures fail. - Discussed advanced non-surgical options: low dose radiation therapy (LDRT), genicular nerve block/ablation, and PRP injection. - Placed referral to radiation oncology (Dr. Lenn) for LDRT evaluation. - Encouraged physical therapy, including prehabilitation and addressing gait and hip mechanics. - Reviewed use of knee brace and walking aids.  Chronic musculoskeletal pain Chronic pain localized to right knee, possibly influenced by altered gait and hip tightness. Managed with multimodal approach. Duloxetine  used consistently; tramadol  for breakthrough pain with caution due to side effects. Reviewed opioid risks. - Sent refill for duloxetine  60 mg; advised continued use for another month to assess efficacy. - Provided duloxetine  taper instructions if discontinuation desired. - Discussed tramadol  for breakthrough pain, including safety profile and advised trial use in controlled environment. - Advised to increase dietary fiber if using tramadol  to mitigate constipation risk. - Encouraged monitoring for pain relief in other areas as a measure of duloxetine  efficacy. - Advised to report on pain control and medication side effects for further management.  Problem List Items Addressed This Visit     Primary osteoarthritis of right knee - Primary   Relevant Medications   DULoxetine  (CYMBALTA ) 60 MG capsule   Other Relevant Orders   Ambulatory referral to Radiation Oncology   Other Visit Diagnoses       Chronic musculoskeletal pain       Relevant Medications   DULoxetine  (CYMBALTA ) 60 MG capsule        Orders & Medications Medications:  Meds ordered this encounter  Medications   DULoxetine  (CYMBALTA ) 60 MG capsule    Sig: Take 1 capsule (60 mg total) by mouth daily.    Dispense:  90  capsule    Refill:  0   Orders Placed This Encounter  Procedures   Ambulatory referral to Radiation Oncology     No follow-ups on file.     Selinda JINNY Ku, MD, [facility]   Primary Care Sports Medicine Primary Care and Sports Medicine at [facility]   "

## 2024-10-01 NOTE — Patient Instructions (Signed)
 VISIT SUMMARY:  Today, we discussed your ongoing right knee pain due to osteoarthritis and reviewed your current pain management strategies. We also talked about potential advanced treatment options and the next steps for further evaluation and therapy.  YOUR PLAN:  PRIMARY OSTEOARTHRITIS OF RIGHT KNEE: You have chronic osteoarthritis in your right knee, causing pain and limiting your activities. -We have ordered an MRI of your right knee to get a better look at the meniscus and the progression of osteoarthritis. -We discussed surgical options, including arthroscopic meniscectomy and total knee replacement, if needed. -We talked about advanced non-surgical options like low dose radiation therapy, nerve block/ablation, and PRP injections. -A referral has been made to Dr. Chrystal for an evaluation of low dose radiation therapy. -Continue with physical therapy to help with your knee and hip mechanics. -Use your knee brace and walking aids as needed. -We will order an updated x-ray of your right hip if needed for physical therapy. -Please report back after your MRI or if your symptoms change for further management.  CHRONIC MUSCULOSKELETAL PAIN: You have ongoing pain in your right knee, which may be affected by your gait and hip tightness. -Continue taking duloxetine  60 mg daily for another month to see if it helps with your pain. -If you want to stop taking duloxetine , follow the taper instructions provided. -You can use tramadol  for breakthrough pain, but be cautious of side effects. Try it in a controlled environment first. -Increase your dietary fiber if you use tramadol  to help prevent constipation. -Monitor your pain relief in other areas to see if duloxetine  is working. -Report any changes in your pain or medication side effects for further management.

## 2024-10-05 ENCOUNTER — Other Ambulatory Visit: Payer: Self-pay

## 2024-10-05 ENCOUNTER — Telehealth: Payer: Self-pay

## 2024-10-05 DIAGNOSIS — M1711 Unilateral primary osteoarthritis, right knee: Secondary | ICD-10-CM

## 2024-10-05 NOTE — Telephone Encounter (Signed)
 Copied from CRM [id number]. Topic: Clinical - Request for Lab/Test Order >> Oct 05, 2024 10:35 AM Antony RAMAN wrote: Reason for CRM: pt calling to see about the MRI for her right knee, checked and did not see it. Pt checked with her insurance and they said she doesn't need prior approval for it. Hoping to get it done this year   Cb- [phone]

## 2024-10-05 NOTE — Telephone Encounter (Signed)
 LMOM informing patient MRI order has been placed and someone will reach out soon to get her scheduled.  JM

## 2024-10-12 ENCOUNTER — Ambulatory Visit
Admission: RE | Admit: 2024-10-12 | Discharge: 2024-10-12 | Disposition: A | Discharge status: Home / Self Care | Source: Ambulatory Visit | Attending: Family Medicine | Admitting: Family Medicine

## 2024-10-12 DIAGNOSIS — M1711 Unilateral primary osteoarthritis, right knee: Secondary | ICD-10-CM | POA: Insufficient documentation

## 2024-10-25 ENCOUNTER — Ambulatory Visit: Payer: Self-pay | Admitting: Family Medicine

## 2024-10-26 ENCOUNTER — Encounter: Payer: Self-pay | Admitting: Student

## 2024-10-26 ENCOUNTER — Other Ambulatory Visit: Payer: Self-pay | Admitting: Student

## 2024-10-26 ENCOUNTER — Ambulatory Visit: Payer: Self-pay

## 2024-10-26 ENCOUNTER — Ambulatory Visit: Admitting: Student

## 2024-10-26 VITALS — BP 114/76 | HR 101 | Temp 98.4°F | Ht 61.0 in | Wt 188.5 lb

## 2024-10-26 DIAGNOSIS — R310 Gross hematuria: Secondary | ICD-10-CM | POA: Diagnosis not present

## 2024-10-26 LAB — POCT URINALYSIS DIPSTICK
Bilirubin, UA: NEGATIVE
Glucose, UA: NEGATIVE
Ketones, UA: NEGATIVE
Nitrite, UA: NEGATIVE
Protein, UA: NEGATIVE
Spec Grav, UA: 1.01
Urobilinogen, UA: 0.2 U/dL
pH, UA: 6.5

## 2024-10-26 NOTE — Telephone Encounter (Signed)
 FYI Only or Action Required?: FYI only for provider: appointment scheduled on 10/26/24.  Patient was last seen in primary care on 10/01/2024 by Alvia Selinda PARAS, MD.  Called Nurse Triage reporting Hematuria.  Symptoms began yesterday.  Interventions attempted: Nothing.  Symptoms are: unchanged.  Triage Disposition: See HCP Within 4 Hours (Or PCP Triage)  Patient/caregiver understands and will follow disposition?: Yes    Copied from CRM #[id number]. Topic: Clinical - Red Word Triage >> Oct 26, 2024  9:24 AM Antwanette L wrote: Red Word that prompted transfer to Nurse Triage: Possible uti. The patient reports experiencing blood in her urine   Reason for Disposition  Taking Coumadin (warfarin) or other strong blood thinner, or known bleeding disorder (e.g., thrombocytopenia)  Answer Assessment - Initial Assessment Questions Pt contacted clinic to report hematuria with UTI symptoms since yesterday. Pt states that she does take ASA frequently but no blood thinners. Pt denies any pain or burning, no fever. Pt states that when she urinates, it looks like she is menstruating but pt is post menopausal at this time. Pt is not taking any medications for symptoms, no injury, no pain. Appointment scheduled for evaluation. Patient agrees with plan of care, and will call back if anything changes, or if symptoms worsen.      1. COLOR of URINE: Describe the color of the urine.  (e.g., tea-colored, pink, red, bloody) Do you have blood clots in your urine? (e.g., none, pea, grape, small coin)     Bright red with clots   2. ONSET: When did the bleeding start?      Yesterday   3. EPISODES: How many times has there been blood in the urine? or How many times today?     With every urination   4. PAIN with URINATION: Is there any pain with passing your urine? If Yes, ask: How bad is the pain?  (Scale 1-10; or mild, moderate, severe)     No   5. FEVER: Do you have a fever? If Yes,  ask: What is your temperature, how was it measured, and when did it start?     No   6. ASSOCIATED SYMPTOMS: Are you passing urine more frequently than usual?     No   7. OTHER SYMPTOMS: Do you have any other symptoms? (e.g., back/flank pain, abdomen pain, vomiting)     None  Protocols used: Urine - Blood In-A-AH

## 2024-10-26 NOTE — Telephone Encounter (Signed)
 Noted  Pt has appt.  KP

## 2024-10-26 NOTE — Progress Notes (Unsigned)
 Ct hematu  Established Patient Office Visit  Subjective   Patient ID: Glenda Burke, female    DOB: Apr 06, 1951  Age: 74 y.o. MRN: [id number]  Chief Complaint  Patient presents with   Hematuria    Blood in the urine started last night 10 pm, urinary frequency all thru the night, noticed some blood cloth in the urine     Glenda Burke is a 74 y.o. person with medical hx listed below who presents today for hematuria that started last night. Notes urine was dark red with clots with increased frequency ~20x yesterday evening. Urinated this morning had 1 episode of hematuria first thing after waking up. Has urinated about 2 times without blood since then. Notes in the past reports urine has had a different odor. Reprots feeling slightly dizzy but ambulating well without LOC. Denies symptoms of retention. No hx of kidney stone Denies recent trauma No fever, flank pain, weight loss, recent illness, abdominal pain, n/v/d, weakness.  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   CKD (chronic kidney disease) stage 3, GFR 30-59 ml/min (HCC) 02/04/2023   Telogen effluvium 10/25/2020   Polyp of colon 09/22/2018   Psoriasis 04/24/2018   Tubular adenoma of colon 01/16/2018   Diabetes mellitus type 2, diet-controlled (HCC) 12/11/2017   Primary osteoarthritis of both knees 12/11/2017   Primary osteoarthritis of right knee 06/02/2015   Axial Low back pain 04/23/2013   Hyperlipidemia 02/24/2013   Anemia 02/24/2013   Essential hypertension 02/18/2013   Obesity 02/18/2013   Annual physical exam 02/18/2013      ROS Refer to HPI    Objective:     Outpatient Encounter Medications as of 10/26/2024  Medication Sig   acetaminophen  (TYLENOL ) 650 MG CR tablet 1 tablet p.o. 2 times to 3 times a day.   aspirin 81 MG tablet Take 81 mg by mouth 3 (three) times a week.   atorvastatin  (LIPITOR) 10 MG tablet TAKE 1  TABLET (10 MG TOTAL) BY MOUTH DAILY AT 6 PM.   calcipotriene  (DOVONOX) 0.005 % cream Apply topically 2 (two) times daily.   Calcium  Carbonate-Vit D-Min (CALCIUM  1200 PO) Take by mouth daily.   clobetasol  ointment (TEMOVATE ) 0.05 % APPLY 1 APPLICATION. TOPICALLY 2 (TWO) TIMES DAILY.   Diclofenac  Sodium 2 % SOLN Place 2 sprays onto the skin 2 (two) times daily. (Patient taking differently: Place 2 sprays onto the skin 2 (two) times daily as needed.)   DULoxetine  (CYMBALTA ) 60 MG capsule Take 1 capsule (60 mg total) by mouth daily.   estradiol  (ESTRACE  VAGINAL) 0.1 MG/GM vaginal cream Place 1 Applicatorful vaginally 3 (three) times a week.   losartan -hydrochlorothiazide (HYZAAR) 100-25 MG tablet TAKE 1 TABLET BY MOUTH EVERY DAY   No facility-administered encounter medications on file as of 10/26/2024.    BP 114/76   Pulse (!) 101   Temp 98.4 F (36.9 C) (Oral)   Ht 5' 1 (1.549 m)   Wt 188 lb 8 oz (85.5 kg)   SpO2 97%   BMI 35.62 kg/m  BP Readings from Last 3 Encounters:  10/26/24 114/76  10/01/24 132/70  08/23/24 130/88    Physical Exam Constitutional:      Appearance: Normal appearance.  HENT:     Mouth/Throat:     Mouth: Mucous membranes are moist.     Pharynx: Oropharynx is clear.  Eyes:     Extraocular Movements: Extraocular movements intact.  Pupils: Pupils are equal, round, and reactive to light.  Cardiovascular:     Rate and Rhythm: Regular rhythm. Tachycardia present.  Pulmonary:     Effort: Pulmonary effort is normal.     Breath sounds: No rhonchi or rales.  Abdominal:     General: Abdomen is flat. Bowel sounds are normal. There is no distension.     Palpations: Abdomen is soft.     Tenderness: There is no abdominal tenderness. There is no right CVA tenderness, left CVA tenderness, guarding or rebound.  Musculoskeletal:        General: Normal range of motion.     Right lower leg: No edema.     Left lower leg: No edema.  Skin:    General: Skin is warm and  dry.     Capillary Refill: Capillary refill takes less than 2 seconds.  Neurological:     General: No focal deficit present.     Mental Status: She is alert and oriented to person, place, and time.  Psychiatric:        Mood and Affect: Mood normal.        Behavior: Behavior normal.        10/26/2024    1:21 PM 10/01/2024    1:39 PM 08/23/2024    1:55 PM  Depression screen PHQ 2/9  Decreased Interest 0 0 0  Down, Depressed, Hopeless 0 0 0  PHQ - 2 Score 0 0 0       10/26/2024    1:21 PM 08/23/2024    1:55 PM 01/23/2022   10:28 AM  GAD 7 : Generalized Anxiety Score  Nervous, Anxious, on Edge 0 0 0  Control/stop worrying 0 0 0  Worry too much - different things   0  Trouble relaxing   1  Restless   0  Easily annoyed or irritable   0  Afraid - awful might happen   0  Total GAD 7 Score   1    Results for orders placed or performed in visit on 10/26/24  Basic metabolic panel with GFR  Result Value Ref Range   Glucose 95 70 - 99 mg/dL   BUN 26 8 - 27 mg/dL   Creatinine, Ser 8.64 (H) 0.57 - 1.00 mg/dL   eGFR 42 (L) >40 fO/fpw/8.26   BUN/Creatinine Ratio 19 12 - 28   Sodium 138 134 - 144 mmol/L   Potassium 3.7 3.5 - 5.2 mmol/L   Chloride 98 96 - 106 mmol/L   CO2 23 20 - 29 mmol/L   Calcium  9.4 8.7 - 10.3 mg/dL  Results for orders placed or performed in visit on 10/26/24  Urine Culture   Specimen: Urine   Urine  Result Value Ref Range   Urine Culture, Routine Final report (A)    Organism ID, Bacteria Escherichia coli (A)    Antimicrobial Susceptibility Comment   Microscopic Examination   Urine  Result Value Ref Range   WBC, UA >30 (A) 0 - 5 /hpf   RBC, Urine >30 (A) 0 - 2 /hpf   Epithelial Cells (non renal) 0-10 0 - 10 /hpf   Casts None seen None seen /lpf   Bacteria, UA Few None seen/Few  Urinalysis, Routine w reflex microscopic  Result Value Ref Range   Specific Gravity, UA 1.015 1.005 - 1.030   pH, UA 6.5 5.0 - 7.5   Color, UA Yellow Yellow   Appearance Ur  Clear Clear   Leukocytes,UA 2+ (A) Negative  Protein,UA Negative Negative/Trace   Glucose, UA Negative Negative   Ketones, UA Negative Negative   RBC, UA 2+ (A) Negative   Bilirubin, UA Negative Negative   Urobilinogen, Ur 1.0 0.2 - 1.0 mg/dL   Nitrite, UA Negative Negative   Microscopic Examination See below:   Specimen status report  Result Value Ref Range   specimen status report Comment   POCT urinalysis dipstick  Result Value Ref Range   Color, UA amber    Clarity, UA cloudy    Glucose, UA Negative Negative   Bilirubin, UA negative    Ketones, UA negative    Spec Grav, UA 1.010 1.010 - 1.025   Blood, UA moderate2+    pH, UA 6.5 5.0 - 8.0   Protein, UA Negative Negative   Urobilinogen, UA 0.2 0.2 or 1.0 E.U./dL   Nitrite, UA negative    Leukocytes, UA Moderate (2+) (A) Negative   Appearance     Odor      Last CBC Lab Results  Component Value Date   WBC 6.5 04/12/2024   HGB 11.5 04/12/2024   HCT 36.2 04/12/2024   MCV 91 04/12/2024   MCH 29.0 04/12/2024   RDW 13.9 04/12/2024   PLT 200 04/12/2024   Last metabolic panel Lab Results  Component Value Date   GLUCOSE 95 10/26/2024   NA 138 10/26/2024   K 3.7 10/26/2024   CL 98 10/26/2024   CO2 23 10/26/2024   BUN 26 10/26/2024   CREATININE 1.35 (H) 10/26/2024   EGFR 42 (L) 10/26/2024   CALCIUM  9.4 10/26/2024   PROT 7.0 04/12/2024   ALBUMIN 4.1 04/12/2024   LABGLOB 2.9 04/12/2024   BILITOT 0.4 04/12/2024   ALKPHOS 194 (H) 04/12/2024   AST 25 04/12/2024   ALT 28 04/12/2024   Last lipids Lab Results  Component Value Date   CHOL 158 04/12/2024   HDL 69 04/12/2024   LDLCALC 76 04/12/2024   TRIG 64 04/12/2024   CHOLHDL 2.3 04/12/2024      The 10-year ASCVD risk score (Arnett DK, et al., 2019) is: 21.3%    Assessment & Plan:  Gross hematuria Assessment & Plan: Presents with 1 day of wine red urine and passing clots. Some blood in urine this morning but not currently seeing blood in stool.  Urine  dipstick today with moderate blood without gross hematuria. Mildly tachycardia on exam. Reports she is urinating well without retention.  -BMP, UA, urine culture -CT hematuria -Urgent referral to Urology -ED percautions for dizziness, inability to make urine, or significant blood in the urine.    Orders: -     Basic metabolic panel with GFR -     Urinalysis, Routine w reflex microscopic -     Ambulatory referral to Urology -     Urine Culture -     POCT urinalysis dipstick -     CT HEMATURIA WORKUP; Future  Other orders -     Microscopic Examination -     Specimen status report     Harlene Saddler, MD

## 2024-10-27 ENCOUNTER — Ambulatory Visit: Payer: Self-pay | Admitting: Student

## 2024-10-27 ENCOUNTER — Ambulatory Visit
Admission: RE | Admit: 2024-10-27 | Discharge: 2024-10-27 | Disposition: A | Discharge status: Home / Self Care | Source: Ambulatory Visit | Attending: Student | Admitting: Student

## 2024-10-27 DIAGNOSIS — R310 Gross hematuria: Secondary | ICD-10-CM | POA: Insufficient documentation

## 2024-10-27 LAB — BASIC METABOLIC PANEL WITH GFR
BUN/Creatinine Ratio: 19 (ref 12–28)
BUN: 26 mg/dL (ref 8–27)
CO2: 23 mmol/L (ref 20–29)
Calcium: 9.4 mg/dL (ref 8.7–10.3)
Chloride: 98 mmol/L (ref 96–106)
Creatinine, Ser: 1.35 mg/dL — ABNORMAL HIGH (ref 0.57–1.00)
Glucose: 95 mg/dL (ref 70–99)
Potassium: 3.7 mmol/L (ref 3.5–5.2)
Sodium: 138 mmol/L (ref 134–144)
eGFR: 42 mL/min/1.73 — ABNORMAL LOW

## 2024-10-27 LAB — URINALYSIS, ROUTINE W REFLEX MICROSCOPIC
Bilirubin, UA: NEGATIVE
Glucose, UA: NEGATIVE
Ketones, UA: NEGATIVE
Nitrite, UA: NEGATIVE
Protein,UA: NEGATIVE
Specific Gravity, UA: 1.015 (ref 1.005–1.030)
Urobilinogen, Ur: 1 mg/dL (ref 0.2–1.0)
pH, UA: 6.5 (ref 5.0–7.5)

## 2024-10-27 LAB — MICROSCOPIC EXAMINATION
Casts: NONE SEEN /LPF
RBC, Urine: >30 /HPF — AB (ref 0–2)
WBC, UA: >30 /HPF — AB (ref 0–5)

## 2024-10-27 LAB — SPECIMEN STATUS REPORT

## 2024-10-27 MED ORDER — SODIUM CHLORIDE 0.9 % IV SOLN: INTRAVENOUS | Status: DC

## 2024-10-27 MED ORDER — IOHEXOL 300 MG/ML  SOLN
100.0000 mL | Freq: Once | INTRAMUSCULAR | Status: AC | PRN
  Administered 2024-10-27: 100 mL via INTRAVENOUS

## 2024-10-27 NOTE — Progress Notes (Signed)
 "  10/29/24 11:20 AM   Glenda Burke 11-Mar-1951 [id number]   HPI: 74 y.o. female here for initial evaluation of gross hematuria   Acute GH w/ clots 10/27/24 Saw PCP UA with 30 WBC, 30 RBC, + LE  - no culture, no antibiotics Referred to Urology CTU (10/27/24) - focal dilation and hyperattenuation of mid Right ureter w/ filling defect on delays  No prior history of GH Never seen a urologist No prior GU conditions or GU surgeries Today asymptomatic, no GH x 2-3 days, no UTI symptoms Never smoker No family history of GU malignancies  She does have nonspecific right back pain, although seems MSK versus neuropathic, radiates down towards right knee    PMH: Past Medical History:  Diagnosis Date   Allergy    Anemia    Arthritis    Bilateral shoulder pain 12/07/2014   Cataract    Hyperlipidemia    Hypertension    Menopausal state     Surgical History: Past Surgical History:  Procedure Laterality Date   arm surgery Left    plate and screw   [place]OS[place]  [place]   [place]   DILATION AND CURETTAGE OF UTERUS     polypectomy      Family History: Family History  Problem Relation Age of Onset   Hypertension Mother    Hyperlipidemia Mother    Congestive Heart Failure Mother    Stroke Father    Diabetes Father    Hyperlipidemia Father    Hypertension Father    Hypertension Sister    Hyperlipidemia Sister    Diabetes Sister    Kidney disease Sister        on HD   Kidney disease Brother        kidney transplant    Hypertension Brother    Hyperlipidemia Brother    Diabetes Brother    Colon cancer Neg Hx    Stomach cancer Neg Hx    Breast cancer Neg Hx    Esophageal cancer Neg Hx    Rectal cancer Neg Hx     Social History:  reports that she has never smoked. She has never used smokeless tobacco. She reports current alcohol use. She reports that she does not use drugs.      Physical Exam: BP 114/76   Pulse (!) 105   Ht 5' (1.524 m)   Wt 180 lb (81.6 kg)    BMI 35.15 kg/m    Constitutional:  Alert and oriented, No acute distress. Cardiovascular: No clubbing, cyanosis, or edema. Respiratory: Normal respiratory effort, no increased work of breathing. GI: Nondistended Skin: No rashes, bruises or suspicious lesions. Neurologic: Grossly intact, no focal deficits, moving all 4 extremities. Psychiatric: Normal mood and affect.  Laboratory Data:  Latest Reference Range & Units 10/26/24 14:27  Creatinine 0.57 - 1.00 mg/dL 8.64 (H)  (H): Data is abnormally high   Latest Reference Range & Units 10/26/24 00:00  Urinalysis, Routine w reflex microscopic  Rpt !  Appearance Ur Clear  Clear  Bilirubin, UA Negative  Negative  Color, UA Yellow  Yellow  Glucose, UA Negative  Negative  Ketones, UA Negative  Negative  Leukocytes,UA Negative  2+ !  Nitrite, UA Negative  Negative  pH, UA 5.0 - 7.5  6.5  Protein,UA Negative/Trace  Negative  Specific Gravity, UA 1.005 - 1.030  1.015  RBC, UA Negative  2+ !  Bacteria, UA None seen/Few  Few  Casts None seen /lpf None seen  Epithelial  Cells (non renal) 0 - 10 /hpf 0-10  Microscopic Examination  See below:  RBC, Urine 0 - 2 /hpf >30 !  WBC, UA 0 - 5 /hpf >30 !  !: Data is abnormal Rpt: View report in Results Review for more information  Pertinent Imaging: I have personally viewed and interpreted the CTU (10/27/24) -did not disagree with radiology read although very subtle findings.  Agree with filling defect/nonopacification of right mid ureter.  Although, I do see similar bilateral proximal ureteral enhancement, mild left extrarenal pelvis versus mild left hydro.  No right hydronephrosis.   focal dilation and hyperattenuation of mid Right ureter w/ filling defect on delays.    Assessment & Plan:    Gross hematuria Assessment & Plan: Isolated GH w/ clots (10/27/24)  CTU (10/27/24) - focal dilation and hyperattenuation of mid Right ureter w/ filling defect on delays  Reviewed her history and recent  CT imaging.  While the radiologic findings are subtle, I do think they warrant a full workup, really in the setting of painless GH with clots.  She is asymptomatic today without lower urinary tract symptoms.  Will send urine for culture, but hold on empiric antibiotics.  I would recommend a formal cystoscopy, bilateral retrograde pyelogram, right ureteroscopy, possible left ureteroscopy, possible ureteral biopsy if indicated.  This would need to be performed under general anesthesia in the OR.  I explained the rationale and indications for the procedure, perioperative pathway, outcomes and possible complications.  All questions answered.  She was willing to proceed  -Scheduled for OR: Cystoscopy, bilateral retrograde pyelograms, bilateral ureteroscopy, possible ureteral biopsy - Urine culture today, hold on antibiotic treatment    Orders: -     Urinalysis, Complete -     CULTURE, URINE COMPREHENSIVE -     Ambulatory Referral For Surgery Scheduling      Penne Skye, MD 10/29/2024  [facility] [address] [place], [place] [address] [phone] "

## 2024-10-28 DIAGNOSIS — R310 Gross hematuria: Secondary | ICD-10-CM | POA: Insufficient documentation

## 2024-10-28 LAB — URINE CULTURE

## 2024-10-28 NOTE — Assessment & Plan Note (Signed)
 Presents with 1 day of wine red urine and passing clots. Some blood in urine this morning but not currently seeing blood in stool.  Urine dipstick today with moderate blood without gross hematuria. Mildly tachycardia on exam. Reports she is urinating well without retention.  -BMP, UA, urine culture -CT hematuria -Urgent referral to Urology -ED percautions for dizziness, inability to make urine, or significant blood in the urine.

## 2024-10-29 ENCOUNTER — Ambulatory Visit: Admitting: Urology

## 2024-10-29 VITALS — BP 114/76 | HR 105 | Ht 60.0 in | Wt 180.0 lb

## 2024-10-29 DIAGNOSIS — R310 Gross hematuria: Secondary | ICD-10-CM

## 2024-10-29 LAB — URINALYSIS, COMPLETE
Bilirubin, UA: NEGATIVE
Glucose, UA: NEGATIVE
Ketones, UA: NEGATIVE
Nitrite, UA: NEGATIVE
Protein,UA: NEGATIVE
Specific Gravity, UA: 1.02 (ref 1.005–1.030)
Urobilinogen, Ur: 0.2 mg/dL (ref 0.2–1.0)
pH, UA: 6 (ref 5.0–7.5)

## 2024-10-29 LAB — MICROSCOPIC EXAMINATION: Epithelial Cells (non renal): >10 /HPF — AB (ref 0–10)

## 2024-10-29 NOTE — Assessment & Plan Note (Addendum)
 Isolated GH w/ clots (10/27/24)  CTU (10/27/24) - focal dilation and hyperattenuation of mid Right ureter w/ filling defect on delays  Reviewed her history and recent CT imaging.  While the radiologic findings are subtle, I do think they warrant a full workup, really in the setting of painless GH with clots.  She is asymptomatic today without lower urinary tract symptoms.  Will send urine for culture, but hold on empiric antibiotics.  I would recommend a formal cystoscopy, bilateral retrograde pyelogram, right ureteroscopy, possible left ureteroscopy, possible ureteral biopsy if indicated.  This would need to be performed under general anesthesia in the OR.  I explained the rationale and indications for the procedure, perioperative pathway, outcomes and possible complications.  All questions answered.  She was willing to proceed  -Scheduled for OR: Cystoscopy, bilateral retrograde pyelograms, bilateral ureteroscopy, possible ureteral biopsy - Urine culture today, hold on antibiotic treatment

## 2024-11-01 ENCOUNTER — Telehealth: Payer: Self-pay

## 2024-11-01 ENCOUNTER — Other Ambulatory Visit: Payer: Self-pay

## 2024-11-01 DIAGNOSIS — R310 Gross hematuria: Secondary | ICD-10-CM

## 2024-11-01 NOTE — Progress Notes (Signed)
 Surgical Physician Order Form [facility]  Dr. Penne Skye, MD  * Scheduling expectation : Next Available  *Length of Case: 30 min  *Clearance needed: no  *Anticoagulation Instructions: N/A  *Aspirin Instructions: N/A  *Post-op visit Date/Instructions:  1-2 week follow up  *Diagnosis: gross hematuria  *Procedure: Cystoscopy, bilateral retrograde pyelograms, bilateral ureteroscopy, possible ureteral biopsy  Additional orders: N/A  -Admit type: OUTpatient  -Anesthesia: General  -VTE Prophylaxis Standing Order SCD's       Other:   -Standing Lab Orders Per Anesthesia    Lab other: UA&Urine Culture  -Standing Test orders EKG/Chest x-ray per Anesthesia       Test other:   - Medications:  Ancef 2gm IV  -Other orders:  N/A

## 2024-11-01 NOTE — Telephone Encounter (Signed)
 Per Dr. Georganne, Patient is to be scheduled for Diagnostic Cystoscopy with Bilateral Retrograde Pyelograms, Bilateral Ureteroscopy with Possible Bilateral Ureteral Biopsy   Glenda Burke was contacted and possible surgical dates were discussed, Tuesday February 10th, 2026 was agreed upon for surgery.   Patient was instructed that Dr. Georganne will require them to provide a pre-op UA & CX prior to surgery. This was ordered and scheduled drop off appointment was made for 11/09/2024.    Patient was directed to call [phone] between 1-3pm the day before surgery to find out surgical arrival time.  Instructions were given not to eat or drink from midnight on the night before surgery and have a driver for the day of surgery. On the surgery day patient was instructed to enter through the [facility] entrance of [facility] report the Same Day Surgery desk.   Pre-Admit Testing will be in contact via phone to set up an interview with the anesthesia team to review your history and medications prior to surgery.   Reminder of this information was sent via Mail to the patient.

## 2024-11-01 NOTE — Progress Notes (Signed)
" ° °  [facility] Surgical Posting Form  Surgery Date: Date: 11/23/2024  Surgeon: Dr. Penne Skye, MD  Inpt ( No  )   Outpt (Yes)   Obs ( No  )   Diagnosis: R31.0 Gross Hematuria  -CPT: 52000, 74420, 52351, 52354  Surgery: Diagnostic Cystoscopy with Bilateral Retrograde Pyelograms, Bilateral Ureteroscopy with Possible Bilateral Ureteral Biopsy  Stop Anticoagulations: Yes and also hold ASA  Cardiac/Medical/Pulmonary Clearance needed: no  *Orders entered into EPIC  Date: 11/01/24   *Case booked in EPIC  Date: 11/01/24  *Notified pt of Surgery: Date: 11/01/24  PRE-OP UA & CX: yes, will obtain in clinic on 11/09/2024  *Placed into Prior Authorization Work [place] Date: 11/01/24  Assistant/laser/rep:No                "

## 2024-11-02 ENCOUNTER — Ambulatory Visit
Admission: RE | Admit: 2024-11-02 | Discharge: 2024-11-02 | Disposition: A | Discharge status: Home / Self Care | Source: Ambulatory Visit | Attending: Radiation Oncology | Admitting: Radiation Oncology

## 2024-11-02 ENCOUNTER — Encounter: Payer: Self-pay | Admitting: Radiation Oncology

## 2024-11-02 VITALS — BP 134/82 | HR 106 | Temp 97.5°F | Resp 16 | Wt 185.0 lb

## 2024-11-02 DIAGNOSIS — M170 Bilateral primary osteoarthritis of knee: Secondary | ICD-10-CM

## 2024-11-02 DIAGNOSIS — M1711 Unilateral primary osteoarthritis, right knee: Secondary | ICD-10-CM | POA: Insufficient documentation

## 2024-11-02 DIAGNOSIS — M1712 Unilateral primary osteoarthritis, left knee: Secondary | ICD-10-CM | POA: Diagnosis not present

## 2024-11-02 DIAGNOSIS — I10 Essential (primary) hypertension: Secondary | ICD-10-CM | POA: Insufficient documentation

## 2024-11-02 DIAGNOSIS — E785 Hyperlipidemia, unspecified: Secondary | ICD-10-CM | POA: Diagnosis not present

## 2024-11-02 DIAGNOSIS — D649 Anemia, unspecified: Secondary | ICD-10-CM | POA: Diagnosis not present

## 2024-11-02 DIAGNOSIS — Z79899 Other long term (current) drug therapy: Secondary | ICD-10-CM | POA: Diagnosis not present

## 2024-11-02 LAB — CULTURE, URINE COMPREHENSIVE

## 2024-11-02 NOTE — Consult Note (Signed)
 " NEW PATIENT EVALUATION  Name: Glenda Burke  MRN: [id number]  Date:   11/02/2024     DOB: 03/27/1951   This 74 y.o. female patient presents to the clinic for initial evaluation of osteoarthritis of the right knee.  REFERRING PHYSICIAN: Alvia Selinda PARAS, MD  CHIEF COMPLAINT:  Chief Complaint  Patient presents with   OA both knees    DIAGNOSIS: The encounter diagnosis was Primary osteoarthritis of both knees.   PREVIOUS INVESTIGATIONS:  MRI of right knee reviewed plain films of right knee reviewed Clinical notes reviewed Labs reviewed  HPI: Patient is a 74 year old female history of chronic right knee pain localized to the medial aspect which is worsened on ambulation.  Plain films of her right knee show compartment joint space narrowing and cartilage loss in the right knee.  Findings consistent with osteoarthritis.  MRI of her right knee showed severe medial femoral but tibial and patellofemoral osteoarthritis she also has blunting throughout the free edge of the body of the medial meniscus with superior extrusion of the body large joint effusion with  synovitis.  She has been taking Cymbalta  60 mg dose which is not helping.  She is now referred for consideration of low-dose radiation for osteoarthritis of the right knee  PLANNED TREATMENT REGIMEN: LDR right knee  PAST MEDICAL HISTORY:  has a past medical history of Allergy, Anemia, Arthritis, Bilateral shoulder pain (12/07/2014), Cataract, Hyperlipidemia, Hypertension, and Menopausal state.    PAST SURGICAL HISTORY:  Past Surgical History:  Procedure Laterality Date   arm surgery Left    plate and screw   COLONOSCOPY  ?   [place] of [place]   DILATION AND CURETTAGE OF UTERUS     polypectomy      FAMILY HISTORY: family history includes Congestive Heart Failure in her mother; Diabetes in her brother, father, and sister; Hyperlipidemia in her brother, father, mother, and sister; Hypertension in her brother, father, mother, and  sister; Kidney disease in her brother and sister; Stroke in her father.  SOCIAL HISTORY:  reports that she has never smoked. She has never used smokeless tobacco. She reports current alcohol use. She reports that she does not use drugs.  ALLERGIES: Hydrocodone   MEDICATIONS:  Current Outpatient Medications  Medication Sig Dispense Refill   acetaminophen  (TYLENOL ) 650 MG CR tablet 1 tablet p.o. 2 times to 3 times a day. 90 tablet 3   aspirin 81 MG tablet Take 81 mg by mouth 3 (three) times a week.     atorvastatin  (LIPITOR) 10 MG tablet TAKE 1 TABLET (10 MG TOTAL) BY MOUTH DAILY AT 6 PM. 90 tablet 3   calcipotriene  (DOVONOX) 0.005 % cream Apply topically 2 (two) times daily. 60 g 3   Calcium  Carbonate-Vit D-Min (CALCIUM  1200 PO) Take by mouth daily.     clobetasol  ointment (TEMOVATE ) 0.05 % APPLY 1 APPLICATION. TOPICALLY 2 (TWO) TIMES DAILY. 60 g 11   Diclofenac  Sodium 2 % SOLN Place 2 sprays onto the skin 2 (two) times daily. (Patient taking differently: Place 2 sprays onto the skin 2 (two) times daily as needed.) 3 g 0   DULoxetine  (CYMBALTA ) 60 MG capsule Take 1 capsule (60 mg total) by mouth daily. 90 capsule 0   estradiol  (ESTRACE  VAGINAL) 0.1 MG/GM vaginal cream Place 1 Applicatorful vaginally 3 (three) times a week. 42.5 g 12   losartan -hydrochlorothiazide (HYZAAR) 100-25 MG tablet TAKE 1 TABLET BY MOUTH EVERY DAY 90 tablet 3   No current facility-administered medications for this encounter.  ECOG PERFORMANCE STATUS:  1 - Symptomatic but completely ambulatory  REVIEW OF SYSTEMS: Patient denies any weight loss, fatigue, weakness, fever, chills or night sweats. Patient denies any loss of vision, blurred vision. Patient denies any ringing  of the ears or hearing loss. No irregular heartbeat. Patient denies heart murmur or history of fainting. Patient denies any chest pain or pain radiating to her upper extremities. Patient denies any shortness of breath, difficulty breathing at night,  cough or hemoptysis. Patient denies any swelling in the lower legs. Patient denies any nausea vomiting, vomiting of blood, or coffee ground material in the vomitus. Patient denies any stomach pain. Patient states has had normal bowel movements no significant constipation or diarrhea. Patient denies any dysuria, hematuria or significant nocturia. Patient denies any problems walking, swelling in the joints or loss of balance. Patient denies any skin changes, loss of hair or loss of weight. Patient denies any excessive worrying or anxiety or significant depression. Patient denies any problems with insomnia. Patient denies excessive thirst, polyuria, polydipsia. Patient denies any swollen glands, patient denies easy bruising or easy bleeding. Patient denies any recent infections, allergies or URI. Patient s visual fields have not changed significantly in recent time.   PHYSICAL EXAM: BP 134/82   Pulse (!) 106   Temp (!) 97.5 F (36.4 C) (Tympanic)   Resp 16   Wt 185 lb (83.9 kg)   BMI 36.13 kg/m  Flexion of the right knee does not elicit pain motor and sensory levels are equal and symmetric in the lower extremities range of motion of her hips does not elicit pain.  Well-developed well-nourished patient in NAD. HEENT reveals PERLA, EOMI, discs not visualized.  Oral cavity is clear. No oral mucosal lesions are identified. Neck is clear without evidence of cervical or supraclavicular adenopathy. Lungs are clear to A&P. Cardiac examination is essentially unremarkable with regular rate and rhythm without murmur rub or thrill. Abdomen is benign with no organomegaly or masses noted. Motor sensory and DTR levels are equal and symmetric in the upper and lower extremities. Cranial nerves II through XII are grossly intact. Proprioception is intact. No peripheral adenopathy or edema is identified. No motor or sensory levels are noted. Crude visual fields are within normal range.  LABORATORY DATA: Labs reviewed     RADIOLOGY RESULTS: Plain films and MRI of right knee reviewed compatible with above-stated findings   IMPRESSION: Severe osteoarthritis of the right knee and 74 year old female  PLAN: This time of offered low-dose rate radiation to her right knee.  Will plan on delivering 3 Gray in 6 fractions risks and benefits of treatment including almost 0 side effect profile both acute and chronic were discussed with the patient.  Generally we see pain relief after the 3rd-4th treatment with most patients claiming at least an 80% reduction in pain.  I have personally set up and ordered CT simulation for next week.  I would like to take this opportunity to thank you for allowing me to participate in the care of your patient.SABRA Marcey Penton, MD         "

## 2024-11-09 ENCOUNTER — Other Ambulatory Visit

## 2024-11-09 DIAGNOSIS — R310 Gross hematuria: Secondary | ICD-10-CM

## 2024-11-09 LAB — MICROSCOPIC EXAMINATION: Epithelial Cells (non renal): >10 /HPF — AB (ref 0–10)

## 2024-11-09 LAB — URINALYSIS, COMPLETE
Bilirubin, UA: NEGATIVE
Glucose, UA: NEGATIVE
Ketones, UA: NEGATIVE
Leukocytes,UA: NEGATIVE
Nitrite, UA: NEGATIVE
Protein,UA: NEGATIVE
Specific Gravity, UA: 1.01 (ref 1.005–1.030)
Urobilinogen, Ur: 0.2 mg/dL (ref 0.2–1.0)
pH, UA: 7 (ref 5.0–7.5)

## 2024-11-10 ENCOUNTER — Ambulatory Visit

## 2024-11-12 ENCOUNTER — Telehealth: Payer: Self-pay

## 2024-11-12 ENCOUNTER — Ambulatory Visit: Payer: Self-pay | Admitting: Urology

## 2024-11-12 LAB — CULTURE, URINE COMPREHENSIVE

## 2024-11-12 MED ORDER — SULFAMETHOXAZOLE-TRIMETHOPRIM 800-160 MG PO TABS: 1.0000 | ORAL_TABLET | Freq: Two times a day (BID) | ORAL | 0 refills | Status: AC

## 2024-11-12 NOTE — Progress Notes (Signed)
 Sending in Bactrim , to begin taking 7 days prior to her upcoming procedure. Please let her know and to pick up. Thank you

## 2024-11-12 NOTE — Telephone Encounter (Signed)
 We received patients urine culture results. I called to inform patient that Dr.Garren sent in Bactrim  for her to take seven days prior to her procedure to [facility] [place] on [address]. I was unable to get a answer left voicemail stating we sent in a medication to her pharmacy and provided the office number to give us  a call back when she got the chance.-Sway Guttierrez,CMA.

## 2024-11-16 ENCOUNTER — Inpatient Hospital Stay
Admission: RE | Admit: 2024-11-16 | Discharge: 2024-11-16 | Disposition: A | Source: Ambulatory Visit | Attending: Urology

## 2024-11-16 ENCOUNTER — Other Ambulatory Visit: Payer: Self-pay

## 2024-11-16 VITALS — Ht 60.0 in | Wt 170.0 lb

## 2024-11-16 DIAGNOSIS — I10 Essential (primary) hypertension: Secondary | ICD-10-CM

## 2024-11-16 HISTORY — DX: Prediabetes: R73.03

## 2024-11-16 HISTORY — DX: Gross hematuria: R31.0

## 2024-11-17 ENCOUNTER — Ambulatory Visit

## 2024-11-18 ENCOUNTER — Inpatient Hospital Stay: Admission: RE | Admit: 2024-11-18 | Discharge: 2024-11-18 | Attending: Urology

## 2024-11-18 DIAGNOSIS — D649 Anemia, unspecified: Secondary | ICD-10-CM

## 2024-11-18 DIAGNOSIS — R310 Gross hematuria: Secondary | ICD-10-CM

## 2024-11-18 DIAGNOSIS — Z01812 Encounter for preprocedural laboratory examination: Secondary | ICD-10-CM

## 2024-11-18 DIAGNOSIS — I10 Essential (primary) hypertension: Secondary | ICD-10-CM

## 2024-11-22 ENCOUNTER — Ambulatory Visit

## 2024-11-23 ENCOUNTER — Ambulatory Visit: Admission: RE | Admit: 2024-11-23 | Source: Home / Self Care | Admitting: Urology

## 2024-11-23 ENCOUNTER — Encounter: Payer: Self-pay | Admitting: Urgent Care

## 2024-11-23 ENCOUNTER — Encounter: Admission: RE | Payer: Self-pay | Source: Home / Self Care

## 2024-11-23 SURGERY — CYSTOSCOPY
Anesthesia: General

## 2024-11-24 ENCOUNTER — Ambulatory Visit

## 2024-11-29 ENCOUNTER — Ambulatory Visit

## 2024-11-30 ENCOUNTER — Ambulatory Visit

## 2024-12-01 ENCOUNTER — Ambulatory Visit

## 2024-12-06 ENCOUNTER — Ambulatory Visit

## 2024-12-08 ENCOUNTER — Ambulatory Visit

## 2024-12-27 ENCOUNTER — Ambulatory Visit: Admitting: Student
